# Patient Record
Sex: Male | Born: 1950
Health system: Southern US, Community
[De-identification: ages and names within clinical notes are randomized; demographics above are authoritative.]

## PROBLEM LIST (undated history)

## (undated) DIAGNOSIS — E785 Hyperlipidemia, unspecified: Secondary | ICD-10-CM

## (undated) DIAGNOSIS — I480 Paroxysmal atrial fibrillation: Secondary | ICD-10-CM

## (undated) DIAGNOSIS — E739 Lactose intolerance, unspecified: Secondary | ICD-10-CM

## (undated) DIAGNOSIS — M79671 Pain in right foot: Secondary | ICD-10-CM

## (undated) DIAGNOSIS — G4761 Periodic limb movement disorder: Secondary | ICD-10-CM

## (undated) HISTORY — DX: Hyperlipidemia, unspecified: E78.5

## (undated) HISTORY — DX: Paroxysmal atrial fibrillation: I48.0

## (undated) HISTORY — DX: Lactose intolerance, unspecified: E73.9

## (undated) HISTORY — DX: Periodic limb movement disorder: G47.61

## (undated) HISTORY — PX: TONSILLECTOMY: SUR1361

## (undated) HISTORY — DX: Pain in right foot: M79.671

---

## 2004-02-19 ENCOUNTER — Encounter: Payer: Self-pay | Admitting: Internal Medicine

## 2004-07-15 ENCOUNTER — Encounter: Payer: Self-pay | Admitting: Internal Medicine

## 2005-08-11 ENCOUNTER — Ambulatory Visit: Payer: Self-pay | Admitting: Internal Medicine

## 2006-02-01 ENCOUNTER — Ambulatory Visit: Payer: Self-pay | Admitting: Internal Medicine

## 2006-08-24 ENCOUNTER — Ambulatory Visit: Payer: Self-pay | Admitting: Internal Medicine

## 2006-08-24 LAB — CONVERTED CEMR LAB
ALT: 19 units/L (ref 0–40)
AST: 32 units/L (ref 0–37)
Creatinine, Ser: 1.4 mg/dL (ref 0.4–1.5)
Potassium: 4.6 meq/L (ref 3.5–5.1)
Sodium: 143 meq/L (ref 135–145)

## 2007-02-15 ENCOUNTER — Ambulatory Visit: Payer: Self-pay | Admitting: Internal Medicine

## 2007-06-14 ENCOUNTER — Ambulatory Visit: Payer: Self-pay | Admitting: Internal Medicine

## 2007-06-14 DIAGNOSIS — J309 Allergic rhinitis, unspecified: Secondary | ICD-10-CM | POA: Insufficient documentation

## 2007-06-14 DIAGNOSIS — R252 Cramp and spasm: Secondary | ICD-10-CM

## 2007-06-14 DIAGNOSIS — E785 Hyperlipidemia, unspecified: Secondary | ICD-10-CM | POA: Insufficient documentation

## 2007-06-14 DIAGNOSIS — M6281 Muscle weakness (generalized): Secondary | ICD-10-CM | POA: Insufficient documentation

## 2007-06-22 ENCOUNTER — Encounter: Payer: Self-pay | Admitting: Internal Medicine

## 2007-06-22 LAB — CONVERTED CEMR LAB
ALT: 11 units/L (ref 0–53)
AST: 23 units/L (ref 0–37)
Albumin: 4.2 g/dL (ref 3.5–5.2)
Alkaline Phosphatase: 58 units/L (ref 39–117)
Bilirubin, Direct: 0.1 mg/dL (ref 0.0–0.3)
Calcium: 9.3 mg/dL (ref 8.4–10.5)
Cholesterol: 229 mg/dL (ref 0–200)
Direct LDL: 168.6 mg/dL
HDL: 42.3 mg/dL (ref >39.0)
Magnesium: 2.1 mg/dL (ref 1.5–2.5)
PSA: 0.57 ng/mL (ref 0.10–4.00)
Potassium: 4.8 meq/L (ref 3.5–5.1)
Sed Rate: 3 mm/hr (ref 0–20)
TSH: 1.78 microintl units/mL (ref 0.35–5.50)
Total Bilirubin: 1.2 mg/dL (ref 0.3–1.2)
Total CHOL/HDL Ratio: 5.4
Total CK: 89 units/L (ref 7–195)
Total Protein: 6.7 g/dL (ref 6.0–8.3)
Triglycerides: 82 mg/dL (ref 0–149)
VLDL: 16 mg/dL (ref 0–40)
Vitamin B-12: 403 pg/mL (ref 211–911)

## 2007-06-24 ENCOUNTER — Encounter (INDEPENDENT_AMBULATORY_CARE_PROVIDER_SITE_OTHER): Payer: Self-pay | Admitting: *Deleted

## 2007-12-03 ENCOUNTER — Telehealth (INDEPENDENT_AMBULATORY_CARE_PROVIDER_SITE_OTHER): Payer: Self-pay | Admitting: *Deleted

## 2007-12-04 ENCOUNTER — Ambulatory Visit: Payer: Self-pay | Admitting: Internal Medicine

## 2007-12-06 ENCOUNTER — Ambulatory Visit: Payer: Self-pay

## 2007-12-11 ENCOUNTER — Encounter: Payer: Self-pay | Admitting: Internal Medicine

## 2007-12-11 ENCOUNTER — Ambulatory Visit: Payer: Self-pay

## 2007-12-13 ENCOUNTER — Encounter: Payer: Self-pay | Admitting: Internal Medicine

## 2007-12-20 ENCOUNTER — Ambulatory Visit: Payer: Self-pay | Admitting: Internal Medicine

## 2008-01-15 ENCOUNTER — Ambulatory Visit: Payer: Self-pay

## 2008-02-05 ENCOUNTER — Ambulatory Visit: Payer: Self-pay | Admitting: Internal Medicine

## 2008-02-17 ENCOUNTER — Encounter (INDEPENDENT_AMBULATORY_CARE_PROVIDER_SITE_OTHER): Payer: Self-pay | Admitting: *Deleted

## 2008-05-13 ENCOUNTER — Ambulatory Visit: Payer: Self-pay | Admitting: Internal Medicine

## 2008-05-13 DIAGNOSIS — M545 Low back pain: Secondary | ICD-10-CM | POA: Insufficient documentation

## 2008-09-18 ENCOUNTER — Ambulatory Visit: Payer: Self-pay | Admitting: Internal Medicine

## 2008-09-25 ENCOUNTER — Encounter: Payer: Self-pay | Admitting: Internal Medicine

## 2008-10-12 ENCOUNTER — Encounter: Payer: Self-pay | Admitting: Internal Medicine

## 2008-10-13 LAB — CONVERTED CEMR LAB
ALT: 12 units/L (ref 0–53)
AST: 24 units/L (ref 0–37)
Cholesterol: 166 mg/dL (ref 0–200)
HDL: 37.8 mg/dL — ABNORMAL LOW (ref >39.0)
LDL Cholesterol: 101 mg/dL — ABNORMAL HIGH (ref 0–99)
Total CHOL/HDL Ratio: 4.4
Triglycerides: 137 mg/dL (ref 0–149)
VLDL: 27 mg/dL (ref 0–40)

## 2008-10-14 ENCOUNTER — Encounter: Payer: Self-pay | Admitting: Internal Medicine

## 2008-10-15 ENCOUNTER — Ambulatory Visit (HOSPITAL_BASED_OUTPATIENT_CLINIC_OR_DEPARTMENT_OTHER): Admission: RE | Admit: 2008-10-15 | Discharge: 2008-10-15 | Payer: Self-pay | Admitting: Internal Medicine

## 2008-10-20 ENCOUNTER — Ambulatory Visit: Payer: Self-pay | Admitting: Pulmonary Disease

## 2008-10-27 ENCOUNTER — Telehealth (INDEPENDENT_AMBULATORY_CARE_PROVIDER_SITE_OTHER): Payer: Self-pay | Admitting: *Deleted

## 2008-10-27 ENCOUNTER — Encounter (INDEPENDENT_AMBULATORY_CARE_PROVIDER_SITE_OTHER): Payer: Self-pay | Admitting: *Deleted

## 2009-01-20 ENCOUNTER — Encounter: Payer: Self-pay | Admitting: Internal Medicine

## 2009-08-04 ENCOUNTER — Encounter: Payer: Self-pay | Admitting: Internal Medicine

## 2009-08-18 ENCOUNTER — Encounter: Payer: Self-pay | Admitting: Internal Medicine

## 2009-08-27 ENCOUNTER — Encounter (INDEPENDENT_AMBULATORY_CARE_PROVIDER_SITE_OTHER): Payer: Self-pay | Admitting: *Deleted

## 2009-10-08 ENCOUNTER — Ambulatory Visit: Payer: Self-pay | Admitting: Internal Medicine

## 2009-10-08 ENCOUNTER — Telehealth (INDEPENDENT_AMBULATORY_CARE_PROVIDER_SITE_OTHER): Payer: Self-pay | Admitting: *Deleted

## 2009-10-18 LAB — CONVERTED CEMR LAB
ALT: 16 units/L (ref 0–53)
AST: 34 units/L (ref 0–37)
Albumin: 4.3 g/dL (ref 3.5–5.2)
Alkaline Phosphatase: 50 units/L (ref 39–117)
Bilirubin, Direct: 0 mg/dL (ref 0.0–0.3)
Cholesterol: 155 mg/dL (ref 0–200)
HDL: 51.3 mg/dL (ref >39.00)
LDL Cholesterol: 94 mg/dL (ref 0–99)
Total Bilirubin: 1 mg/dL (ref 0.3–1.2)
Total CHOL/HDL Ratio: 3
Total Protein: 6.9 g/dL (ref 6.0–8.3)
Triglycerides: 49 mg/dL (ref 0.0–149.0)
VLDL: 9.8 mg/dL (ref 0.0–40.0)

## 2009-11-15 ENCOUNTER — Telehealth: Payer: Self-pay | Admitting: Internal Medicine

## 2010-03-04 ENCOUNTER — Ambulatory Visit: Payer: Self-pay | Admitting: Internal Medicine

## 2010-03-04 DIAGNOSIS — I4891 Unspecified atrial fibrillation: Secondary | ICD-10-CM

## 2010-03-04 DIAGNOSIS — G4730 Sleep apnea, unspecified: Secondary | ICD-10-CM | POA: Insufficient documentation

## 2010-03-22 ENCOUNTER — Telehealth (INDEPENDENT_AMBULATORY_CARE_PROVIDER_SITE_OTHER): Payer: Self-pay | Admitting: *Deleted

## 2010-03-23 ENCOUNTER — Ambulatory Visit: Payer: Self-pay

## 2010-03-23 ENCOUNTER — Ambulatory Visit: Payer: Self-pay | Admitting: Cardiology

## 2010-03-23 ENCOUNTER — Ambulatory Visit (HOSPITAL_COMMUNITY): Admission: RE | Admit: 2010-03-23 | Discharge: 2010-03-23 | Payer: Self-pay | Admitting: Internal Medicine

## 2010-03-23 ENCOUNTER — Encounter: Payer: Self-pay | Admitting: Internal Medicine

## 2010-03-23 ENCOUNTER — Encounter (HOSPITAL_COMMUNITY): Admission: RE | Admit: 2010-03-23 | Discharge: 2010-06-07 | Payer: Self-pay | Admitting: Internal Medicine

## 2010-04-07 ENCOUNTER — Ambulatory Visit: Payer: Self-pay | Admitting: Internal Medicine

## 2010-06-10 ENCOUNTER — Ambulatory Visit: Payer: Self-pay | Admitting: Internal Medicine

## 2010-06-10 DIAGNOSIS — G43809 Other migraine, not intractable, without status migrainosus: Secondary | ICD-10-CM | POA: Insufficient documentation

## 2010-06-10 DIAGNOSIS — S86819A Strain of other muscle(s) and tendon(s) at lower leg level, unspecified leg, initial encounter: Secondary | ICD-10-CM

## 2010-06-10 DIAGNOSIS — S838X9A Sprain of other specified parts of unspecified knee, initial encounter: Secondary | ICD-10-CM | POA: Insufficient documentation

## 2010-06-17 ENCOUNTER — Telehealth: Payer: Self-pay | Admitting: Internal Medicine

## 2010-06-28 ENCOUNTER — Telehealth: Payer: Self-pay | Admitting: Internal Medicine

## 2010-06-29 ENCOUNTER — Ambulatory Visit: Payer: Self-pay | Admitting: Internal Medicine

## 2010-06-30 LAB — CONVERTED CEMR LAB
Basophils Absolute: 0 10*3/uL (ref 0.0–0.1)
HCT: 43 % (ref 39.0–52.0)
Lymphs Abs: 1.1 10*3/uL (ref 0.7–4.0)
Monocytes Relative: 9.9 % (ref 3.0–12.0)
Neutrophils Relative %: 63.6 % (ref 43.0–77.0)
Platelets: 219 10*3/uL (ref 150.0–400.0)
RDW: 12.6 % (ref 11.5–14.6)

## 2010-07-15 ENCOUNTER — Telehealth: Payer: Self-pay | Admitting: Internal Medicine

## 2010-08-19 ENCOUNTER — Encounter: Payer: Self-pay | Admitting: Internal Medicine

## 2010-10-14 ENCOUNTER — Encounter: Payer: Self-pay | Admitting: Internal Medicine

## 2010-10-14 ENCOUNTER — Ambulatory Visit: Payer: Self-pay | Admitting: Internal Medicine

## 2010-11-08 ENCOUNTER — Encounter: Payer: Self-pay | Admitting: Internal Medicine

## 2010-11-11 ENCOUNTER — Other Ambulatory Visit: Payer: Self-pay | Admitting: Internal Medicine

## 2010-11-11 ENCOUNTER — Encounter (INDEPENDENT_AMBULATORY_CARE_PROVIDER_SITE_OTHER): Payer: Self-pay | Admitting: *Deleted

## 2010-11-11 ENCOUNTER — Ambulatory Visit
Admission: RE | Admit: 2010-11-11 | Discharge: 2010-11-11 | Payer: Self-pay | Source: Home / Self Care | Attending: Internal Medicine | Admitting: Internal Medicine

## 2010-11-11 LAB — CBC WITH DIFFERENTIAL/PLATELET
Basophils Absolute: 0 10*3/uL (ref 0.0–0.1)
Basophils Relative: 0.2 % (ref 0.0–3.0)
Eosinophils Absolute: 0 10*3/uL (ref 0.0–0.7)
Eosinophils Relative: 0.9 % (ref 0.0–5.0)
HCT: 43.7 % (ref 39.0–52.0)
Hemoglobin: 15.3 g/dL (ref 13.0–17.0)
Lymphocytes Relative: 27.7 % (ref 12.0–46.0)
Lymphs Abs: 1.3 10*3/uL (ref 0.7–4.0)
MCHC: 34.9 g/dL (ref 30.0–36.0)
MCV: 96.7 fl (ref 78.0–100.0)
Monocytes Absolute: 0.5 10*3/uL (ref 0.1–1.0)
Monocytes Relative: 9.9 % (ref 3.0–12.0)
Neutro Abs: 2.9 10*3/uL (ref 1.4–7.7)
Neutrophils Relative %: 61.3 % (ref 43.0–77.0)
Platelets: 219 10*3/uL (ref 150.0–400.0)
RBC: 4.52 Mil/uL (ref 4.22–5.81)
RDW: 13.1 % (ref 11.5–14.6)
WBC: 4.8 10*3/uL (ref 4.5–10.5)

## 2010-11-11 LAB — APTT: aPTT: 42.6 s — ABNORMAL HIGH (ref 21.7–28.8)

## 2010-11-11 LAB — BASIC METABOLIC PANEL
BUN: 18 mg/dL (ref 6–23)
CO2: 27 mEq/L (ref 19–32)
Calcium: 9 mg/dL (ref 8.4–10.5)
Chloride: 108 mEq/L (ref 96–112)
Creatinine, Ser: 1.3 mg/dL (ref 0.4–1.5)
GFR: 59.59 mL/min — ABNORMAL LOW (ref >60.00)
Glucose, Bld: 85 mg/dL (ref 70–99)
Potassium: 4.4 mEq/L (ref 3.5–5.1)
Sodium: 141 mEq/L (ref 135–145)

## 2010-11-11 LAB — PROTIME-INR
INR: 1.1 ratio — ABNORMAL HIGH (ref 0.8–1.0)
Prothrombin Time: 12.2 s (ref 10.2–12.4)

## 2010-11-16 ENCOUNTER — Ambulatory Visit (HOSPITAL_COMMUNITY)
Admission: RE | Admit: 2010-11-16 | Discharge: 2010-11-16 | Payer: Self-pay | Source: Home / Self Care | Attending: Cardiovascular Disease | Admitting: Cardiovascular Disease

## 2010-11-16 ENCOUNTER — Encounter: Payer: Self-pay | Admitting: Cardiovascular Disease

## 2010-11-17 ENCOUNTER — Ambulatory Visit (HOSPITAL_COMMUNITY)
Admission: RE | Admit: 2010-11-17 | Discharge: 2010-11-18 | Payer: Self-pay | Source: Home / Self Care | Attending: Internal Medicine | Admitting: Internal Medicine

## 2010-11-17 HISTORY — PX: OTHER SURGICAL HISTORY: SHX169

## 2010-11-21 LAB — MRSA PCR SCREENING: MRSA by PCR: NEGATIVE

## 2010-12-06 NOTE — Assessment & Plan Note (Signed)
Summary: f1y  Medications Added OSTEO BI-FLEX TRIPLE STRENGTH  TABS (MISC NATURAL PRODUCTS) take one tablet daily ZYRTEC ALLERGY 10 MG TABS (CETIRIZINE HCL) once daily FLONASE 50 MCG/ACT SUSP (FLUTICASONE PROPIONATE) use 1 spray in each nostril as needed ALEVE 220 MG TABS (NAPROXEN SODIUM) TAKE as needed for pain      Allergies Added: NKDA  CC:  f1y. Pt states he has had palpitations more recently and possible afib.  Pt has questions about A-fib in terms of long term heart changes..  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 He was last seen about a year and a half ago following a visit to Dr. Macon Large at [facility] for consideration of pulmonary vein isolation for recurrent atrial arrhythmias including PACs and fibrillation. He elected at that time not to pursue ablation mostly because of concern about the length of the procedure. He was treated with flecainide and his beta blocker was changed to verapamil and he did quite well for about 12 months.  Last few months he has had more problems with palpitations. Interestingly when he takes his pulse he does not find that it is racing.  He is not had exertional chest discomfort. He is able to go skiing with his sons without difficulty. He does worry however about coronary artery disease.  Reviewing his records we have an echo from 2009 demonstrating normal left ventricular function but a very large left atrium at 4.9 cm. His last Myoview on record is 2004; it was normal.  Current Medications (verified): 1)  Pravachol 40 Mg  Tabs (Pravastatin Sodium) .Marland Kitchen.. 1 At Bedtime- Labs Due Now 2)  Singulair 10 Mg Tabs (Montelukast Sodium) .... Take 1 Tab Once Daily 3)  Flecainide Acetate 150 Mg Tabs (Flecainide Acetate) .... Take One Tablet Two Times A Day 4)  Osteo Bi-Flex Triple Strength  Tabs (Misc Natural Products) .... Take One Tablet Daily 5)  Verapamil Hcl Cr 120 Mg Cr-Tabs (Verapamil Hcl) .... Take One Tablet  Once Daily 6)  Zyrtec Allergy 10 Mg Tabs (Cetirizine Hcl) .... Once Daily 7)  Flonase 50 Mcg/act Susp (Fluticasone Propionate) .... Use 1 Spray in Each Nostril As Needed 8)  Aleve 220 Mg Tabs (Naproxen Sodium) .... Take As Needed For Pain  Allergies (verified): No Known Drug Allergies  Vital Signs:  Patient profile:   60 year old male Height:      73 inches Weight:      224 pounds BMI:     29.66 Pulse rate:   60 / minute Pulse rhythm:   irregular BP sitting:   112 / 72  (left arm) Cuff size:   large  Vitals Entered By: Judithe Modest CMA (March 04, 2010 9:32 AM)  Physical Exam  General:  The patient was alert and oriented in no acute distress. HEENT Normal.  Neck veins were flat, carotids were brisk.  Lungs were clear.  Heart sounds were regular without murmurs or gallops.  Abdomen was soft with active bowel sounds. There is no clubbing cyanosis or edema. Skin Warm and dry    EKG  Procedure date:  03/04/2010  Findings:      sinus rhythm at 60 Intervals 0.21/0.11/0.43 Axis is 19 R. prime in lead V1 and V2 Otherwise normal  Impression & Recommendations:  Problem # 1:  ATRIAL FIBRILLATION (ICD-427.31) The patient has recurrent episodes of atrial fibrillation. Multiple issues were discussed today related to this lasting 40-45 minutes.  #1. The need for anticoagulation. The patient  has a CHADS VASC score of zero. I assured them that Coumadin was not necessary and that aspirin was optional.  #2 treatment options for his arrhythmia. These would include catheter ablation. We discussed long-term benefit data as well as short-term benefits and risks. He will continue to consider this.  #3 the palpitations that he has now are somewhat different from what he has before. I wonder as we reviewed his event recorder together whether these represent isolated PACs were very short runs that by the time he chooses to take his pulse is gone back to normal.  #4 potential interactions  between atrial fibrillation and obstructive sleep apnea. See below His updated medication list for this problem includes:    Flecainide Acetate 150 Mg Tabs (Flecainide acetate) .Marland Kitchen... Take one tablet two times a day  Problem # 2:  SLEEP APNEA (ICD-780.57) the patient has daytime somnolence a.m. fatigue and sleep disorder breathing. We'll arrange a 2 night night watch for study  Other Orders: EKG w/ Interpretation (93000) Nuclear Stress Test (Nuc Stress Test) Echocardiogram (Echo)  Patient Instructions: 1)  Your physician has requested that you have an echocardiogram.  Echocardiography is a painless test that uses sound waves to create images of your heart. It provides your doctor with information about the size and shape of your heart and how well your heart's chambers and valves are working.  This procedure takes approximately one hour. There are no restrictions for this procedure. 2)  Your physician has requested that you have an exercise stress myoview.  For further information please visit [email or web address]  Please follow instruction sheet, as given.

## 2010-12-06 NOTE — Progress Notes (Signed)
Summary: med not working  Psychologist, occupational from:  Patient on [facility]  Refills Requested: Medication #1:  PRAVACHOL 40 MG  TABS 1 at bedtime- LABS DUE NOW   Supply Requested: 3 months  Medication #2:  ALLEGRA 180 MG  TABS 1 by mouth once daily   Supply Requested: 3 months pt states that the allegra is not really helping to control his allergy. pt would like to know if there is something else that can be used to help control allergy  Initial call taken by: [facility] CMA,  November 15, 2009 2:05 PM  Follow-up for Phone Call        Singulair 10 mg once daily #30 Follow-up by: Marga Melnick MD,  November 15, 2009 2:26 PM  Additional Follow-up for Phone Call Additional follow up Details #1::        left pt detail message rx sent to pharmacy.............Marland KitchenFelecia Deloach CMA  November 15, 2009 3:38 PM     New/Updated Medications: SINGULAIR 10 MG TABS (MONTELUKAST SODIUM) take 1 tab once daily Prescriptions: SINGULAIR 10 MG TABS (MONTELUKAST SODIUM) take 1 tab once daily  #90 x 0   Entered by:   Jeremy Johann CMA   Authorized by:   Marga Melnick MD   Signed by:   Jeremy Johann CMA on 11/15/2009   Method used:   Faxed to ...       [facility] [address] # [phone]* (retail)       [address]       [place], [place]  [address]       Ph: [phone]       Fax: [phone]   RxID:   [id number] PRAVACHOL 40 MG  TABS (PRAVASTATIN SODIUM) 1 at bedtime- LABS DUE NOW  #90.0 Each x 2   Entered by:   Jeremy Johann CMA   Authorized by:   Marga Melnick MD   Signed by:   Jeremy Johann CMA on 11/15/2009   Method used:   Faxed to ...       [facility] [address] # [phone]* (retail)       [address]       [place], [place]  [address]       Ph: [phone]       Fax: [phone]   RxID:   [id number]

## 2010-12-06 NOTE — Assessment & Plan Note (Signed)
Summary: ec6/eval for ablation/per kelly/jml      Allergies Added: NKDA  Visit Type:  Initial Consult Referring Provider:  Dr Graciela Husbands Primary Provider:  Dr Alwyn Ren  CC:  afib.  History of Present Illness: Randy Cantu is a pleasant 60 yo WM with a h/o paroxysmal atrial fibrillation who presents today for further EP evaluation of afib.  He was initially diagnosed with SVT after presenting with rapid palpitations 5 years ago.  He was evaluated by Randy Graciela Husbands and determined to have afib.  He was initially treated with propranolol as needed.  He subsequently developed increasing frequency and duration of atrial fibrillation.  He was placed on flecainide and verapamil 2 years ago.  Flecainide did very well initially.  Unfortunately, he has had increasing frequency and duration of afib since that time.  He was seen by Randy Macon Large at [facility] 1 year ago, but decided to not have ablation.  He denies CP, SOB, dizziness, presyncope, or syncope.  During afib, he reports palpitations and fatigue.  He reports short episodes of afib/ palpitations typically after standing quickly or lying on his side.  Current Medications (verified): 1)  Pravachol 40 Mg  Tabs (Pravastatin Sodium) .Marland Kitchen.. 1 At Bedtime- Labs Due Now 2)  Singulair 10 Mg Tabs (Montelukast Sodium) .... Take 1 Tab Once Daily 3)  Flecainide Acetate 150 Mg Tabs (Flecainide Acetate) .... Take One Tablet Two Times A Day 4)  Osteo Bi-Flex Triple Strength  Tabs (Misc Natural Products) .... Take One Tablet Daily 5)  Verapamil Hcl Cr 120 Mg Cr-Tabs (Verapamil Hcl) .... Take One Tablet Once Daily 6)  Zyrtec Allergy 10 Mg Tabs (Cetirizine Hcl) .... Once Daily 7)  Flonase 50 Mcg/act Susp (Fluticasone Propionate) .... Use 1 Spray in Each Nostril As Needed 8)  Aleve 220 Mg Tabs (Naproxen Sodium) .... Take As Needed For Pain  Allergies (verified): No Known Drug Allergies  Past History:  Past Medical History: Reviewed history from 03/03/2010 and no changes  required. Allergic rhinitis Hyperlipidemia Paroxysmal atrial fibrillation  Past Surgical History: Reviewed history from 06/14/2007 and no changes required. Tonsillectomy  Family History: Reviewed history from 06/14/2007 and no changes required. 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  Denies tob,  glass or 2 of wine most days.  Denies drug use.  Review of Systems       All systems are reviewed and negative except as listed in the HPI.  Recent sleep study was normal per pt.  Vital Signs:  Patient profile:   60 year old male Height:      73 inches Weight:      223 pounds BMI:     29.53 Pulse rate:   52 / minute Resp:     16 per minute BP sitting:   132 / 78  (left arm)  Vitals Entered By: Marrion Coy, CNA (April 07, 2010 8:08 AM)  Physical Exam  General:  Well developed, well nourished, in no acute distress. Head:  normocephalic and atraumatic Eyes:  PERRLA/EOM intact; conjunctiva and lids normal. Nose:  no deformity, discharge, inflammation, or lesions Mouth:  Teeth, gums and palate normal. Oral mucosa normal. Neck:  Neck supple, no JVD. No masses, thyromegaly or abnormal cervical nodes. Lungs:  Clear bilaterally to auscultation and percussion. Heart:  Non-displaced PMI, chest non-tender; regular rate and rhythm, S1, S2 without murmurs, rubs or  gallops. Carotid upstroke normal, no bruit. Normal abdominal aortic size, no bruits. Femorals normal pulses, no bruits. Pedals normal pulses. No edema, no varicosities. Abdomen:  Bowel sounds positive; abdomen soft and non-tender without masses, organomegaly, or hernias noted. No hepatosplenomegaly. Msk:  Back normal, normal gait. Muscle strength and tone normal. Pulses:  pulses normal in all 4 extremities Extremities:  No clubbing or cyanosis. Neurologic:  Alert and oriented x 3. Skin:  Intact without lesions or  rashes. Cervical Nodes:  no significant adenopathy Psych:  Normal affect.   EKG  Procedure date:  04/07/2010  Findings:      sinus bradycardia, incomplete RBBB  Impression & Recommendations:  Problem # 1:  ATRIAL FIBRILLATION (ICD-427.31) The patient has symptomatic paroxysmal afib refractory to flecainide, verapamil, and beta blockers.  Therapeutic strategies for afib including medicine and ablation were discussed in detail with the patient today. Risk, benefits, and alternatives to EP study and radiofrequency ablation for afib were also discussed in detail today. These risks include but are not limited to stroke, bleeding, vascular damage, tamponade, perforation, damage to the esophagus, lungs, and other structures, pulmonary vein stenosis, worsening renal function, and death. The patient understands these risk and wishes to proceed.   He anticipates that he will have the procedure in November.  He will return to our office in September to discuss initiation of coumadin before the procedure. He has moderate LA enlargement and also moderate MR.  These will be followed by Randy Graciela Husbands with serial echos.

## 2010-12-06 NOTE — Progress Notes (Signed)
Summary: Triage: Concerns  Phone Note Call from Patient Call back at [phone]   Summary of Call: Message left on VM:   1.) Patient with increased migraines, patient is taking Topamax 50 mg 1 by mouth two times a day, and on his own added a 25mg  tab that he had to the eveing dose for a total of 50mg  am, 75mg  evening. Patient is wondering if this is ok  2.) Patient is going on a cruise next Wed and need a rx for him and his wife sent to pharmacy, this will be a 12 day cruise. Patient would also like Cipro just in case  3.) Patient would also like Gabapentin filled, he is almost finished with the #90 that was given on 06/28/2010   Dr.Hopper please advise on all 3 concerns./[facility]rae [facility] CMA  July 15, 2010 3:08 PM         New/Updated Medications: CIPROFLOXACIN HCL 500 MG TABS (CIPROFLOXACIN HCL) 1 by mouth two times a day TRANSDERM-SCOP 1.5 MG PT72 (SCOPOLAMINE BASE) 1 patch every 3 days Prescriptions: TRANSDERM-SCOP 1.5 MG PT72 (SCOPOLAMINE BASE) 1 patch every 3 days  #4 x 0   Entered and Authorized by:   Marga Melnick MD   Signed by:   Marga Melnick MD on 07/15/2010   Method used:   Faxed to ...       [facility] [address] # [phone]* (retail)       [address]       [place], [place]  [address]       Ph: [phone]       Fax: [phone]   RxID:   [id number] CIPROFLOXACIN HCL 500 MG TABS (CIPROFLOXACIN HCL) 1 by mouth two times a day  #20 x 0   Entered by:   Shonna Chock CMA   Authorized by:   Marga Melnick MD   Signed by:   Shonna Chock CMA on 07/15/2010   Method used:   Electronically to        [facility] [address] # [phone]* (retail)       [address]       [place], [place]  [address]       Ph: [phone]       Fax: [phone]   RxID:   [id number] GABAPENTIN 100 MG CAPS (GABAPENTIN) 1-3 every  8-12 hrs as needed  #90 x 2   Entered by:   Shonna Chock CMA   Authorized by:   Marga Melnick MD   Signed by:   Shonna Chock CMA on 07/15/2010   Method used:    Electronically to        [facility] # [phone]* (retail)       [address]       [place], [place]  [address]       Ph: [phone]       Fax: [phone]   RxID:   [id number]

## 2010-12-06 NOTE — Assessment & Plan Note (Signed)
Summary: Cardiology Nuclear Study  Nuclear Med Background Indications for Stress Test: Evaluation for Ischemia   History: Echo, GXT, Myocardial Perfusion Study  History Comments: '03 MPS: NL, EF=77% '09 Echo: NL, LV Fx '09 GXT: (Flecainide-no evidence of V. ectopy)  Symptoms: Palpitations    Nuclear Pre-Procedure Cardiac Risk Factors: Lipids Caffeine/Decaff Intake: None NPO After: 10:00 PM Lungs: clear IV 0.9% NS with Angio Cath: 18g     IV Site: (R) AC IV Started by: Stanton Kidney EMT-P Chest Size (in) 46     Height (in): 73 Weight (lb): 224 BMI: 29.66  Nuclear Med Study 1 or 2 day study:  1 day     Stress Test Type:  Stress Reading MD:  Olga Millers, MD     Referring MD:  S.Klein Resting Radionuclide:  Technetium 6m Tetrofosmin     Resting Radionuclide Dose:  11 mCi  Stress Radionuclide:  Technetium 33m Tetrofosmin     Stress Radionuclide Dose:  33 mCi   Stress Protocol Exercise Time (min):  11:00 min     Max HR:  157 bpm     Predicted Max HR:  162 bpm  Max Systolic BP: 189 mm Hg     Percent Max HR:  96.91 %     METS: 13.4 Rate Pressure Product:  [address]    Stress Test Technologist:  Milana Na EMT-P     Nuclear Technologist:  Domenic Polite CNMT  Rest Procedure  Myocardial perfusion imaging was performed at rest 45 minutes following the intravenous administration of Myoview Technetium 68m Tetrofosmin.  Stress Procedure  The patient exercised for 11:00. The patient stopped due to fatigue and denied any chest pain.  There were no significant ST-T wave changes.  Myoview was injected at peak exercise and myocardial perfusion imaging was performed after a brief delay.  QPS Raw Data Images:  Acuisition technically good; normal left ventricular size. Stress Images:  There is mild decreased uptake in the anterior wall. Rest Images:  There is mild decreased uptake in the anterior wall. Subtraction (SDS):  No evidence of ischemia. Transient Ischemic Dilatation:  .91   (Normal <1.22)  Lung/Heart Ratio:  .29  (Normal <0.45)  Quantitative Gated Spect Images QGS EDV:  118 ml QGS ESV:  40 ml QGS EF:  66 % QGS cine images:  Normal wall motion.   Overall Impression  Exercise Capacity: Excellent exercise capacity. BP Response: Normal blood pressure response. Clinical Symptoms: There is chest pain ECG Impression: No significant ST segment change suggestive of ischemia. Overall Impression: Low risk stress nuclear study with soft tissue attenuation but no ischemia.  Appended Document: Cardiology Nuclear Study pt aware of results

## 2010-12-06 NOTE — Assessment & Plan Note (Signed)
Summary: f/u cbs   Vital Signs:  Patient profile:   60 year old male Weight:      223.8 pounds Pulse rate:   60 / minute Resp:     14 per minute BP sitting:   104 / 68  (left arm) Cuff size:   large  Vitals Entered By: Shonna Chock CMA (June 29, 2010 9:37 AM) CC: Follow-up visit: Headaches    Primary Care Provider:  Dr Alwyn Ren  CC:  Follow-up visit: Headaches .  History of Present Illness: Migraines flared last week with single beer ; caffeine also had triggered headache recently. Gabapentin has been beneficial , but he is taking it 2 three times a day  for control. Headaches have improved as to severity & duration, "now a level 1-2 intermittently , previously 5+ & daily ". He is having greater periods of being headache free.  Current Medications (verified): 1)  Pravachol 40 Mg  Tabs (Pravastatin Sodium) .Marland Kitchen.. 1 At Bedtime- Labs Due Now 2)  Singulair 10 Mg Tabs (Montelukast Sodium) .... Take 1 Tab Once Daily 3)  Flecainide Acetate 150 Mg Tabs (Flecainide Acetate) .... Take One Tablet Two Times A Day 4)  Osteo Bi-Flex Triple Strength  Tabs (Misc Natural Products) .... Take One Tablet Daily 5)  Verapamil Hcl Cr 120 Mg Cr-Tabs (Verapamil Hcl) .... Take One Tablet Once Daily 6)  Topiragen 25 Mg Tabs (Topiramate) .Marland Kitchen.. 1 By Mouth Two Times A Day 7)  Gabapentin 100 Mg Caps (Gabapentin) .Marland Kitchen.. 1-3 Every  8-12 Hrs As Needed 8)  Allegra 180 Mg Tabs (Fexofenadine Hcl) .Marland Kitchen.. 1 By Mouth Once Daily  Allergies (verified): No Known Drug Allergies  Review of Systems General:  Denies chills, fever, and sweats. ENT:  Complains of nasal congestion and ringing in ears; denies ear discharge, earache, and sinus pressure; No facial pain  but frontal headache with  some yellow & bloody discharge in am intermittently.Marland Kitchen Resp:  Denies cough, shortness of breath, sputum productive, and wheezing. Allergy:  Complains of itching eyes and sneezing; denies hives or rash; Using Neti pot, Singulair & allegra or  Zyrtec. No angioedema.  Physical Exam  General:  well-nourished; alert,appropriate and cooperative throughout examination Eyes:  No corneal or conjunctival inflammation noted. Perrla.  Ears:  External ear exam shows no significant lesions or deformities.  Otoscopic examination reveals clear canals, tympanic membranes are intact bilaterally without bulging, retraction, inflammation or discharge. Hearing is grossly normal bilaterally. Nose:  External nasal examination shows no deformity or inflammation. Nasal mucosa are pink and moist without lesions or exudates. Mouth:  Oral mucosa and oropharynx without lesions or exudates.  Teeth in good repair. Lungs:  Normal respiratory effort, chest expands symmetrically. Lungs are clear to auscultation, no crackles or wheezes. Cervical Nodes:  No lymphadenopathy noted Axillary Nodes:  No palpable lymphadenopathy   Impression & Recommendations:  Problem # 1:  MIGRAINE VARIANT (ICD-346.20)  The following medications were removed from the medication list:    Aleve 220 Mg Tabs (Naproxen sodium) .Marland Kitchen... Take as needed for pain  Orders: T-Sinuses Complete (70220TC)  Problem # 2:  ALLERGIC RHINITIS (ICD-477.9)  intermittent purulence & bloody discharge The following medications were removed from the medication list:    Zyrtec Allergy 10 Mg Tabs (Cetirizine hcl) ..... Once daily as needed His updated medication list for this problem includes:    Allegra 180 Mg Tabs (Fexofenadine hcl) .Marland Kitchen... 1 by mouth once daily  Orders: T-Sinuses Complete (70220TC) Venipuncture ([address]) TLB-CBC Platelet - w/Differential (85025-CBCD)  Complete Medication List: 1)  Pravachol 40 Mg Tabs (Pravastatin sodium) .Marland Kitchen.. 1 at bedtime 2)  Singulair 10 Mg Tabs (Montelukast sodium) .... Take 1 tab once daily 3)  Flecainide Acetate 150 Mg Tabs (Flecainide acetate) .... Take one tablet two times a day 4)  Osteo Bi-flex Triple Strength Tabs (Misc natural products) .... Take one  tablet daily 5)  Verapamil Hcl Cr 120 Mg Cr-tabs (Verapamil hcl) .... Take one tablet once daily 6)  Topiragen 50 Mg Tabs (topiramate)  .Marland Kitchen.. 1 by mouth two times a day 7)  Gabapentin 100 Mg Caps (Gabapentin) .Marland Kitchen.. 1-3 every  8-12 hrs as needed 8)  Allegra 180 Mg Tabs (Fexofenadine hcl) .Marland Kitchen.. 1 by mouth once daily  Patient Instructions: 1)  Keep Headache Diary as discussed Prescriptions: SINGULAIR 10 MG TABS (MONTELUKAST SODIUM) take 1 tab once daily  #90 x 1   Entered and Authorized by:   Marga Melnick MD   Signed by:   Marga Melnick MD on 06/29/2010   Method used:   Print then Give to Patient   RxID:   [id number] TOPIRAGEN  50  MG TABS (TOPIRAMATE) 1 by mouth two times a day  #180 x 1   Entered and Authorized by:   Marga Melnick MD   Signed by:   Marga Melnick MD on 06/29/2010   Method used:   Print then Give to Patient   RxID:   [id number] PRAVACHOL 40 MG  TABS (PRAVASTATIN SODIUM) 1 at bedtime  #90 x 1   Entered and Authorized by:   Marga Melnick MD   Signed by:   Marga Melnick MD on 06/29/2010   Method used:   Print then Give to Patient   RxID:   [id number]   Appended Document: f/u cbs

## 2010-12-06 NOTE — Progress Notes (Signed)
Summary: refill  Phone Note Outgoing Call Call back at [phone]   Call placed by: [facility] CMA,  June 17, 2010 9:31 AM Summary of Call: pt left VM  that migraines are better but he still continue to get them in the afternoon. pt has been taken the gabapentin three times a day since the 1st day and just started the topamax bid on wed.  Pt will run out of gabapentin on sunday and needs a refill. pt uses [facility].............Felecia Deloach CMA  June 17, 2010 9:31 AM   Follow-up for Phone Call        Patient had refill on 06/10/2010 # 30, Dr.Hopper please advise if ok to fill again Follow-up by: Chrae Malloy CMA,  June 17, 2010 9:34 AM  Additional Follow-up for Phone Call Additional follow up Details #1::        Gabapentin 100 mg #60 ; can be increased to 2 every 8 hrs as needed  Additional Follow-up by: William Hopper MD,  June 17, 2010 10:09 AM    Additional Follow-up for Phone Call Additional follow up Details #2::    left pt detail message of med change and rx sent to pharmacy..................Felecia Deloach CMA  June 17, 2010 11:48 AM   New/Updated Medications: GABAPENTIN 100 MG CAPS (GABAPENTIN) increased to 2 every 8 hrs as needed Prescriptions: GABAPENTIN 100 MG CAPS (GABAPENTIN) increased to 2 every 8 hrs as needed  #60 x 0   Entered by:   Felecia Deloach CMA   Authorized by:   William Hopper MD   Signed by:   Felecia Deloach CMA on 06/17/2010   Method used:   Faxed to ...       [facility] # [id number]* (retail)       [address]       [place], [place]  [address]       Ph: [phone]       Fax: [phone]   RxID:   [id number]

## 2010-12-06 NOTE — Progress Notes (Signed)
Summary: Nuclear Pre-Procedure  Phone Note Outgoing Call Call back at [facility] Phone [phone]   Call placed by: Stanton Kidney, EMT-P,  Mar 22, 2010 12:58 PM Action Taken: Phone Call Completed Summary of Call: Left message with information on [employer] Information Sheet (see scanned document for details).     Nuclear Med Background Indications for Stress Test: Evaluation for Ischemia   History: Echo, GXT, Myocardial Perfusion Study  History Comments: '03 MPS: NL, EF=77% '09 Echo: NL, LV Fx '09 GXT: (Flecainide-no evidence of V. ectopy)  Symptoms: Palpitations    Nuclear Pre-Procedure Cardiac Risk Factors: Lipids Height (in): 73

## 2010-12-06 NOTE — Progress Notes (Signed)
Summary: Refill Request, Last Filled on 06/17/2010  Phone Note Refill Request Call back at [phone] Message from:  Patient  Refills Requested: Medication #1:  GABAPENTIN 100 MG CAPS increased to 2 every 8 hrs as needed.   Last Refilled: 06/17/2010 **Patient with pending appointment TOMORROW** Patient is out of med and needs his night and early morning dose, [facility] [place] Dr.Hopper please advise, last filled 06/17/10 #60  Initial call taken by: Shonna Chock CMA,  June 28, 2010 1:58 PM  Follow-up for Phone Call        #90;  1-3 every  8-12 hrs as needed  Follow-up by: Marga Melnick MD,  June 28, 2010 4:35 PM    New/Updated Medications: GABAPENTIN 100 MG CAPS (GABAPENTIN) 1-3 every  8-12 hrs as needed Prescriptions: GABAPENTIN 100 MG CAPS (GABAPENTIN) 1-3 every  8-12 hrs as needed  #90 x 0   Entered by:   Jeremy Johann CMA   Authorized by:   Marga Melnick MD   Signed by:   Jeremy Johann CMA on 06/28/2010   Method used:   Faxed to ...       [facility] Dr[address] # [id number]* (retail)       [address]       [place], [place]  [address]       Ph: [phone]       Fax: [phone]   RxID:   [id number]

## 2010-12-06 NOTE — Assessment & Plan Note (Signed)
Summary: headache/cbs   Vital Signs:  Patient profile:   60 year old male Weight:      224 pounds Temp:     98.5 degrees F oral Pulse rate:   72 / minute Resp:     12 per minute BP sitting:   120 / 74  (left arm) Cuff size:   large  Vitals Entered By: Shonna Chock CMA (June 10, 2010 12:20 PM) CC: Headaches, pulled muscle (left leg), and ? Randy.Kolleen Ochsner's opinion on AFib Surgery   Primary Care Provider:  Dr Alwyn Ren  CC:  Headaches, pulled muscle (left leg), and and ? Randy.Nevaen Tredway's opinion on AFib Surgery.  History of Present Illness:  Headaches      Randy Cantu  is a 60 year old man who presents with Headaches for 2-3 months, almost daily.  The patient reports nasal congestion  which may or may not accompany  headaches, but denies nausea, vomiting, sweats, tearing of eyes, sinus pain, sinus pressure, photophobia, and phonophobia.  The headache is described as intermittent and sharp.  The location of the pain is  mid frontally , same location as prior migraines. Last migraine headache was in 2004.Migraines would typically  taper over 48-72 hrs . Migraines resolved with prayer & Topamax. The only high-risk features (red flags) include age >50 years.  The patient denies the following high-risk features: fever, neck pain/stiffness, vision loss or change, focal weakness, altered mental status, rash, pain worse with exertion, or  new type of headache. This headache is not as bad as migraine. He has no prodrome or aura.He had visual prodrome with the migraines.Imitrex caused mental staus changes & dizziness. The headaches  have no triggers.  Prior treatment has included  NSAIDS (Aleve & ibuprofen) with only partial relief .    Current Medications (verified): 1)  Pravachol 40 Mg  Tabs (Pravastatin Sodium) .Marland Kitchen.. 1 At Bedtime- Labs Due Now 2)  Singulair 10 Mg Tabs (Montelukast Sodium) .... Take 1 Tab Once Daily 3)  Flecainide Acetate 150 Mg Tabs (Flecainide Acetate) .... Take One Tablet Two Times A Day 4)   Osteo Bi-Flex Triple Strength  Tabs (Misc Natural Products) .... Take One Tablet Daily 5)  Verapamil Hcl Cr 120 Mg Cr-Tabs (Verapamil Hcl) .... Take One Tablet Once Daily 6)  Zyrtec Allergy 10 Mg Tabs (Cetirizine Hcl) .... Once Daily As Needed 7)  Aleve 220 Mg Tabs (Naproxen Sodium) .... Take As Needed For Pain  Allergies (verified): No Known Drug Allergies  Review of Systems Eyes:  Denies blurring, double vision, and vision loss-both eyes. ENT:  Denies decreased hearing and earache; No facial pain or purulence. Slight tinnitus. MS:  Complains of muscle aches; He pulled  L thigh  hamstring wake surfing .  Physical Exam  General:  well-nourished,in no acute distress; alert,appropriate and cooperative throughout examination Eyes:  No corneal or conjunctival inflammation noted. EOMI. Perrla. Field of Vision grossly normal. Ears:  External ear exam shows no significant lesions or deformities.  Otoscopic examination reveals clear canals, tympanic membranes are intact bilaterally without bulging, retraction, inflammation or discharge. Hearing is grossly normal bilaterally. Nose:  External nasal examination shows no deformity or inflammation. Nasal mucosa are pink and moist without lesions or exudates. Mouth:  Oral mucosa and oropharynx without lesions or exudates.  Teeth in good repair. Heart:  Normal rate and regular rhythm. S1 and S2 normal without gallop, murmur, click, rub. Pulses:  R and L carotid  pulses are full and equal bilaterally w/o bruits Extremities:  No clubbing, cyanosis, edema, or deformity noted with normal full range of motion of all joints.  Linear ecchymosis L medial thigh . Negative SLR Neurologic:  alert & oriented X3, cranial nerves II-XII intact, strength normal in all extremities, sensation intact to light touch, gait normal, DTRs symmetrical and normal, finger-to-nose normal, heel-to-shin normal, and Romberg negative.   Skin:  See LLE Psych:  memory intact for recent  and remote, normally interactive, and good eye contact.     Impression & Recommendations:  Problem # 1:  MIGRAINE VARIANT (ICD-346.20) Chronic Daily Headache His updated medication list for this problem includes:    Aleve 220 Mg Tabs (Naproxen sodium) .Marland Kitchen... Take as needed for pain  Problem # 2:  MUSCLE STRAIN, HAMSTRING MUSCLE (ICD-844.8) Muscle fiber tearing  Complete Medication List: 1)  Pravachol 40 Mg Tabs (Pravastatin sodium) .Marland Kitchen.. 1 at bedtime- labs due now 2)  Singulair 10 Mg Tabs (Montelukast sodium) .... Take 1 tab once daily 3)  Flecainide Acetate 150 Mg Tabs (Flecainide acetate) .... Take one tablet two times a day 4)  Osteo Bi-flex Triple Strength Tabs (Misc natural products) .... Take one tablet daily 5)  Verapamil Hcl Cr 120 Mg Cr-tabs (Verapamil hcl) .... Take one tablet once daily 6)  Zyrtec Allergy 10 Mg Tabs (Cetirizine hcl) .... Once daily as needed 7)  Aleve 220 Mg Tabs (Naproxen sodium) .... Take as needed for pain 8)  Topiragen 25 Mg Tabs (Topiramate) .... As directed 9)  Gabapentin 100 Mg Caps (Gabapentin) .Marland Kitchen.. 1 every 8 hrs as needed  Patient Instructions: 1)  Stretching exercises with low weights as discussed.Zostrix cream OR moist heat.Topamax 25 mg at bedtime X 1 week then two times a day thereafter. Keep a Headache Diary as discussed. Prescriptions: GABAPENTIN 100 MG CAPS (GABAPENTIN) 1 every 8 hrs as needed  #30 x 0   Entered and Authorized by:   Marga Melnick MD   Signed by:   Marga Melnick MD on 06/10/2010   Method used:   Faxed to ...       [facility] Randy. # [phone]* (retail)       [address]       [place], [place]  [address]       Ph: [phone]       Fax: [phone]   RxID:   [id number] TOPIRAGEN 25 MG TABS (TOPIRAMATE) as directed  #60 x 0   Entered and Authorized by:   Marga Melnick MD   Signed by:   Marga Melnick MD on 06/10/2010   Method used:   Faxed to ...       [facility] Randy. # [phone]* (retail)       [address]       [place], [place]  [address]       Ph: [phone]       Fax: [phone]   RxID:   [id number]

## 2010-12-06 NOTE — Letter (Signed)
[facility]   Imported By: Lanelle Bal 08/27/2010 08:36:12  _____________________________________________________________________  External Attachment:    Type:   Image     Comment:   External Document

## 2010-12-08 NOTE — Letter (Signed)
Summary: ELectrophysiology/Ablation Procedure Instructions  [facility], Main Office  [address]   [place], [place] [address]   Phone: [phone]  Fax: [phone]     Electrophysiology/Ablation Procedure Instructions    You are scheduled for a(n) afib ablation  on 11/17/2010 at 7:30am with Dr. Johney Frame.  1.  Please come to the Short Stay Center at [facility] at 5:30am on the day of your procedure.  2.  Come prepared to stay overnight.   Please bring your insurance cards and a list of your medications.  3.  Come to the [place] office on 11/11/2010  for lab work.  The lab at [facility] is open from 8:30 AM to 1:30 PM and 2:30 PM to 5:00 PM.  The lab at [facility] is open from 7:30 AM to 5:30 PM.  You do not have to be fasting.  4.  Do not have anything to eat or drink after midnight the night before your procedure.  5.  Do NOT take these medications for 48 hours prior to  your procedure unless otherwise instructed: flecainide and Verapamil.  All of your remaining medications may be taken with a small amount of water.  6.  Educational material received Ablation   * Occasionally, EP studies and ablations can become lengthy.  Please make your family aware of this before your procedure starts.  Average time ranges from 2-8 hours for EP studies/ablations.  Your physician will locate your family after the procedure with the results.  * If you have any questions after you get home, please call the office at [phone]. Dennis Bast, RN  TEE--  11/16/2010  at 2:00pm  Check in at Short Stay at 1:00pm  Nothing to eat or Drink after midnight the night prior to procedure.  Dr Elease Hashimoto will do TEE

## 2010-12-08 NOTE — Assessment & Plan Note (Signed)
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  He was initially diagnosed with SVT after presenting with rapid palpitations 5 years ago.  He was evaluated by Dr Graciela Husbands and determined to have afib.  He was initially treated with propranolol as needed.  He subsequently developed increasing frequency and duration of atrial fibrillation.  He was placed on flecainide and verapamil 2 years ago.  Flecainide did very well initially.  Unfortunately, he has had increasing frequency and duration of afib since that time.  He was seen by Dr Macon Large at [facility] 1 year ago, but decided to not have ablation.  He denies CP, SOB, dizziness, presyncope, or syncope.  Presently, he reports haivng afib several times per week.  During afib, he reports palpitations and fatigue.  He reports short episodes of afib/ palpitations typically after standing quickly or lying on his side.  Current Medications (verified): 1)  Pravachol 40 Mg  Tabs (Pravastatin Sodium) .Marland Kitchen.. 1 At Bedtime 2)  Flecainide Acetate 150 Mg Tabs (Flecainide Acetate) .... Take One Tablet Two Times A Day 3)  Osteo Bi-Flex Triple Strength  Tabs (Misc Natural Products) .... Take One Tablet Daily 4)  Verapamil Hcl Cr 120 Mg Cr-Tabs (Verapamil Hcl) .... Take One Tablet Once Daily 5)  Zonegran 100 Mg Caps (Zonisamide) .... 3 Capsules At Bedtime 6)  Allegra 180 Mg Tabs (Fexofenadine Hcl) .... 1/2 By Mouth Once Daily  Allergies (verified): No Known Drug Allergies  Past History:  Past Medical History: Reviewed history from 03/03/2010 and no changes required. Allergic  rhinitis Hyperlipidemia Paroxysmal atrial fibrillation  Past Surgical History: Reviewed history from 06/14/2007 and no changes required. Tonsillectomy  Family History: Reviewed history from 04/07/2010 and no changes required. Father: AA  Mother: PVD, afib in her 6s MGF - prostate Ca PGF - leukemia    Social History: Reviewed history from 04/07/2010 and no changes required. 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  Denies tob,  glass or 2 of wine most days.  Denies drug use.  Review of Systems       All systems are reviewed and negative except as listed in the HPI.   Vital Signs:  Patient profile:   60 year old male Height:      73 inches Weight:      220 pounds BMI:     29.13 Pulse rate:   55 / minute BP sitting:   112 / 70  (left arm)  Vitals Entered By: Laurance Flatten CMA (October 14, 2010 3:12 PM)  Physical Exam  General:  Well developed, well nourished, in no acute distress. Head:  normocephalic and atraumatic Eyes:  PERRLA/EOM intact; conjunctiva and lids normal. Mouth:  Teeth, gums and palate normal. Oral mucosa normal. Neck:  Neck supple, no JVD. No masses, thyromegaly or abnormal cervical nodes. Lungs:  Clear bilaterally to auscultation and percussion. Heart:  Non-displaced PMI, chest non-tender; regular rate and rhythm, S1, S2 without murmurs, rubs or gallops. Carotid upstroke normal, no bruit. Normal abdominal aortic size, no bruits. Femorals normal pulses, no bruits. Pedals normal pulses. No edema,  no varicosities. Abdomen:  Bowel sounds positive; abdomen soft and non-tender without masses, organomegaly, or hernias noted. No hepatosplenomegaly. Msk:  Back normal, normal gait. Muscle strength and tone normal. Pulses:  pulses normal in all 4 extremities Extremities:  No clubbing or cyanosis. Neurologic:  Alert and oriented x 3.   Echocardiogram  Procedure date:  03/23/2010  Findings:       Study Conclusions    - Left ventricle: The  cavity size was normal. There was mild focal     basal hypertrophy of the septum. Systolic function was normal. The     estimated ejection fraction was in the range of 55% to 60%. Wall     motion was normal; there were no regional wall motion     abnormalities. Doppler parameters are consistent with abnormal     left ventricular relaxation (grade 1 diastolic dysfunction).   - Mitral valve: Moderate regurgitation.   - Left atrium: The atrium was moderately dilated.   - Right atrium: The atrium was mildly dilated.   Transthoracic echocardiography. M-mode, complete 2D, spectral   Doppler, and color Doppler. Height: Height: 185.4cm. Height: 73in.   Weight: Weight: 101.6kg. Weight: 223.5lb. Body mass index: BMI:   29.6kg/m^2. Body surface area: BSA: 2.42m^2. Blood pressure: 141/81.   Patient status: Outpatient. Location: [facility]  Left atrium                        Mitral valve   AP dim           50 mm     ------  Peak E vel    82.1 cm/s   -------   AP dim index   2.21 cm/m^2 <2.2    Peak A vel    74.7 cm/s   -------             Prepared and Electronically Authenticated by    Olga Millers, MD, [facility]   2011-05-18T12:[id number]     EKG  Procedure date:  10/16/2010  Findings:      sinus bradycardia 55 bpm, PR 210, QRS 110, Qtc 430, otherwise normal ekg  Impression & Recommendations:  Problem # 1:  ATRIAL FIBRILLATION (ICD-427.31) The patient has symptomatic paroxysmal afib refractory to flecainide, verapamil, and beta blockers.  Therapeutic strategies for afib including medicine and ablation were discussed in detail with the patient today. Risk, benefits, and alternatives to EP study and radiofrequency ablation for afib were also discussed in detail today. These risks include but are not limited to stroke, bleeding, vascular damage, tamponade, perforation, damage to the esophagus, lungs, and other structures, pulmonary vein stenosis, worsening renal function, and death. The  patient understands these risk and wishes to proceed.   We will therefore initiate pradaxa 150mg  two times a day and plan ablation at the next available time.

## 2010-12-08 NOTE — Assessment & Plan Note (Signed)
Summary: Pradaxa 150mg 

## 2010-12-08 NOTE — Letter (Signed)
Summary: ELectrophysiology/Ablation Procedure Instructions  [facility], Main Office  [address]   [place], [place] [address]   Phone: [phone]  Fax: [phone]     TEE Procedure Instructions Rev-1 November 11, 2010       You may have may a clear liquid breakfast up until 8:00 am the morning of November 16, 2009 before your TEE procedure. Clear liquids include water, broth, coffee, tea (no sugar), Sprite, Ginger Ale, cranberry/apple/grape juice and popsicle from clear juices. Nothing to eat or drink after that time.    * Occasionally, EP studies and ablations can become lengthy.  Please make your family aware of this before your procedure starts.  Average time ranges from 2-8 hours for EP studies/ablations.  Your physician will locate your family after the procedure with the results.  * If you have any questions after you get home, please call the office at [phone]. Dennis Bast, RN

## 2010-12-15 NOTE — Discharge Summary (Signed)
  NAME:  Randy Cantu, Randy Cantu NO.:  [id number]  MEDICAL RECORD NO.:  [id number]           PATIENT TYPE:  LOCATION:                                 FACILITY:  PHYSICIAN:  Doylene Canning. Ladona Ridgel, MD    DATE OF BIRTH:  [birth year]  DATE OF ADMISSION:  11/17/2010 DATE OF DISCHARGE:                              DISCHARGE SUMMARY   DATE OF ADMISSION:  November 17, 2010.  DATE OF DISCHARGE:  November 18, 2010.  PRIMARY CARDIOLOGIST:  Dr. Sherryl Manges and Dr. Hillis Range.  PRIMARY CARE PROVIDER:  [name].  DISCHARGE DIAGNOSES: 1. Paroxysmal atrial fibrillation, status post atrial fibrillation. 2. Hyperlipidemia. 3. History of allergic rhinitis. 4. Status post tonsillectomy.  ALLERGIES:  No known drug allergies.  PROCEDURES:  Electrophysiologic study with successful pulmonary vein isolation and radiofrequency catheter ablation.  HISTORY OF PRESENT ILLNESS:  A 60 year old male with prior history of paroxysmal atrial fibrillation despite ongoing verapamil and flecainide therapy.  He is anticoagulated with Pradaxa.  He was recently seen in clinic by Dr. Johney Frame, December 9, when patient reported recurrent AFib, and after discussion, decision was made to pursue AFib ablation.  HOSPITAL COURSE:  The patient presented to the [facility] on January 12, where he underwent electrophysiologic study with subsequent successful pulmonary vein isolation and radiofrequency catheter ablation.  The patient tolerated the procedure well, and post procedure, has maintained sinus rhythm with a first-degree AV block.  He has had no paroxysms of AFib.  We have initiated Protonix therapy for the next 4 weeks, and he will, otherwise, be discharged home on his previous medicines.  He will follow up with Dr. Johney Frame in approximately 12 weeks.  DISCHARGE LABS:  None.  DISPOSITION:  The patient will be discharged home today in good condition.  FOLLOWUP APPOINTMENTS:  The patient will follow  up with Dr. Johney Frame in approximately 12 weeks and follow up with [name] as previously scheduled.  DISCHARGE MEDICATIONS:  Protonix 40 mg daily, flecainide 150 mg b.i.d., Pradaxa 150 mg b.i.d., pravastatin 40 mg daily, verapamil SR 120 mg daily, Zonegran 100 mg 3 capsules at bedtime Zyrtec 10 mg daily.  OUTSTANDING LABORATORY STUDIES:  None.  Duration of discharge encounter, 40 minutes including physician time.     Nicolasa Ducking, ANP   ______________________________ Doylene Canning. Ladona Ridgel, MD    CB/MEDQ  D:  11/18/2010  T:  11/19/2010  Job:  [id number]  cc:   [name]  Electronically Signed by Nicolasa Ducking ANP on 12/05/2010 12:21:09 PM Electronically Signed by Lewayne Bunting MD on 12/15/2010 05:13:47 PM

## 2011-01-04 ENCOUNTER — Ambulatory Visit (INDEPENDENT_AMBULATORY_CARE_PROVIDER_SITE_OTHER): Payer: BC Managed Care – PPO | Admitting: Internal Medicine

## 2011-01-04 ENCOUNTER — Encounter: Payer: Self-pay | Admitting: Internal Medicine

## 2011-01-04 ENCOUNTER — Other Ambulatory Visit: Payer: Self-pay | Admitting: Internal Medicine

## 2011-01-04 DIAGNOSIS — K219 Gastro-esophageal reflux disease without esophagitis: Secondary | ICD-10-CM | POA: Insufficient documentation

## 2011-01-04 DIAGNOSIS — R1013 Epigastric pain: Secondary | ICD-10-CM

## 2011-01-04 LAB — CBC WITH DIFFERENTIAL/PLATELET
Eosinophils Relative: 0.5 % (ref 0.0–5.0)
HCT: 43.8 % (ref 39.0–52.0)
Lymphocytes Relative: 23.8 % (ref 12.0–46.0)
Monocytes Relative: 9.7 % (ref 3.0–12.0)
Neutrophils Relative %: 65.8 % (ref 43.0–77.0)
Platelets: 236 10*3/uL (ref 150.0–400.0)
WBC: 5.1 10*3/uL (ref 4.5–10.5)

## 2011-01-04 LAB — LIPASE: Lipase: 36 U/L (ref 11.0–59.0)

## 2011-01-04 LAB — H. PYLORI ANTIBODY, IGG: H Pylori IgG: NEGATIVE

## 2011-01-04 LAB — AMYLASE: Amylase: 39 U/L (ref 27–131)

## 2011-01-12 NOTE — Assessment & Plan Note (Signed)
Summary: dirrehea/stomach pain x'3 wk/cbs   Vital Signs:  Patient profile:   60 year old male Height:      73 inches (185.42 cm) Weight:      221.25 pounds (100.57 kg) BMI:     29.30 Temp:     98.5 degrees F (36.94 degrees C) oral Resp:     15 per minute BP sitting:   110 / 76  (left arm) Cuff size:   large  Vitals Entered By: Lucious Groves CMA (January 04, 2011 9:58 AM) CC: Diarrhea and abd pain x 3 weeks./kb, Abdominal pain Is Patient Diabetic? No Pain Assessment Patient in pain? yes     Location: abdomen Intensity: 5 Type: burning Onset of pain  3 weeks ago Comments Patient notes that he has been having abd burning and cramping. He denies fever, rectal bleeding, and vomiting.   Primary Care Provider:  Dr Alwyn Ren  CC:  Diarrhea and abd pain x 3 weeks./kb and Abdominal pain.  History of Present Illness:      Randy Cantu  presents with Abdominal pain for 3 weeks following Gastroenteritis outbreak among staff  @ his office.  The patient denies nausea, vomiting, diarrhea ( intermittent loose stool), constipation, melena, hematochezia, anorexia, and hematemesis.  The location of the pain is epigastric.  The pain is described as intermittent, burning - cramping in quality.  The patient denies the following symptoms: fever, weight loss, dysuria, chest pain, No PMH or FH of GI disease except ERD.? rash while on Protonix post ablation 11/17/2010. He questions GI symptoms related to Pradaxa .  Current Medications (verified): 1)  Pravachol 40 Mg  Tabs (Pravastatin Sodium) .Marland Kitchen.. 1 At Bedtime 2)  Flecainide Acetate 150 Mg Tabs (Flecainide Acetate) .... Take One Tablet Two Times A Day 3)  Osteo Bi-Flex Triple Strength  Tabs (Misc Natural Products) .... Take One Tablet Daily 4)  Verapamil Hcl Cr 120 Mg Cr-Tabs (Verapamil Hcl) .... Take One Tablet Once Daily 5)  Zonegran 100 Mg Caps (Zonisamide) .... 3 Capsules At Bedtime 6)  Allegra 180 Mg Tabs (Fexofenadine Hcl) .... 1/2 By Mouth Once  Daily 7)  Pradaxa 150 Mg Caps (Dabigatran Etexilate Mesylate) .... One By Mouth Two Times A Day 8)  Zegerid 40-1100 Mg Caps (Omeprazole-Sodium Bicarbonate) .Marland Kitchen.. 1 By Mouth Two Times A Day 9)  Zyrtec Allergy 10 Mg Tabs (Cetirizine Hcl) .Marland Kitchen.. 1 By Mouth Qhs  Allergies (verified): 1)  ! Protonix (Pantoprazole Sodium)  Past History:  Past Surgical History: Tonsillectomy Colonoscopy neg @ 51; Upper Endo : ERD ? 1997  Physical Exam  General:  well-nourished,in no acute distress; alert,appropriate and cooperative throughout examination Eyes:  No corneal or conjunctival inflammation noted. EOMI. No icterus Mouth:  Oral mucosa and oropharynx without lesions or exudates.  Teeth in good repair. No pharyngeal erythema.   Lungs:  Normal respiratory effort, chest expands symmetrically. Lungs are clear to auscultation, no crackles or wheezes. Heart:  regular rhythm, no murmur, no gallop, no rub, no JVD, no HJR, and bradycardia.   Abdomen:  Bowel sounds positive,abdomen soft and non-tender without masses, organomegaly or hernias noted. Skin:  Intact without suspicious lesions or rashes. nO JAUNDICE Cervical Nodes:  No lymphadenopathy noted Axillary Nodes:  No palpable lymphadenopathy Psych:  memory intact for recent and remote, normally interactive, and good eye contact.     Impression & Recommendations:  Problem # 1:  ABDOMINAL PAIN, EPIGASTRIC (ICD-789.06)  Orders: Venipuncture ([address]) TLB-CBC Platelet - w/Differential (85025-CBCD) TLB-Amylase (82150-AMYL) TLB-Lipase (83690-LIPASE) TLB-H. Pylori  Abs(Helicobacter Pylori) (86677-HELICO)  Problem # 2:  GERD (ICD-530.81)  Orders: Venipuncture ([address]) TLB-CBC Platelet - w/Differential (85025-CBCD) TLB-Amylase (82150-AMYL) TLB-Lipase (83690-LIPASE) TLB-H. Pylori Abs(Helicobacter Pylori) (86677-HELICO)  His updated medication list for this problem includes:    Ranitidine Hcl 150 Mg Tabs (Ranitidine hcl) .Marland Kitchen... 1 two times a day pre  meals  Complete Medication List: 1)  Pravachol 40 Mg Tabs (Pravastatin sodium) .Marland Kitchen.. 1 at bedtime 2)  Flecainide Acetate 150 Mg Tabs (Flecainide acetate) .... Take one tablet two times a day 3)  Osteo Bi-flex Triple Strength Tabs (Misc natural products) .... Take one tablet daily 4)  Verapamil Hcl Cr 120 Mg Cr-tabs (Verapamil hcl) .... Take one tablet once daily 5)  Zonegran 100 Mg Caps (Zonisamide) .... 3 capsules at bedtime 6)  Allegra 180 Mg Tabs (Fexofenadine hcl) .... 1/2 by mouth once daily 7)  Pradaxa 150 Mg Caps (Dabigatran etexilate mesylate) .... One by mouth two times a day 8)  Zyrtec Allergy 10 Mg Tabs (Cetirizine hcl) .Marland Kitchen.. 1 by mouth qhs 9)  Ranitidine Hcl 150 Mg Tabs (Ranitidine hcl) .Marland Kitchen.. 1 two times a day pre meals  Patient Instructions: 1)  Use Align once daily if stools are loose. Complete stool cards. 2)  Avoid foods high in acid (tomatoes, citrus juices, spicy foods). Avoid eating within two hours of lying down or before exercising. Do not over eat; try smaller more frequent meals. Elevate head of bed twelve inches when sleeping. Prescriptions: RANITIDINE HCL 150 MG TABS (RANITIDINE HCL) 1 two times a day pre meals  #60 x 0   Entered and Authorized by:   Marga Melnick MD   Signed by:   Marga Melnick MD on 01/04/2011   Method used:   Electronically to        [facility] # [phone]* (retail)       [address]       [place], [place]  [address]       Ph: [phone]       Fax: [phone]   RxID:   [id number]    Orders Added: 1)  Venipuncture [27253] 2)  TLB-CBC Platelet - w/Differential [85025-CBCD] 3)  TLB-Amylase [82150-AMYL] 4)  TLB-Lipase [83690-LIPASE] 5)  TLB-H. Pylori Abs(Helicobacter Pylori) [86677-HELICO] 6)  Est. Patient Level IV [66440]

## 2011-01-13 ENCOUNTER — Other Ambulatory Visit (INDEPENDENT_AMBULATORY_CARE_PROVIDER_SITE_OTHER): Payer: BC Managed Care – PPO

## 2011-01-13 ENCOUNTER — Encounter: Payer: Self-pay | Admitting: Internal Medicine

## 2011-01-13 DIAGNOSIS — Z1211 Encounter for screening for malignant neoplasm of colon: Secondary | ICD-10-CM

## 2011-01-13 LAB — CONVERTED CEMR LAB
OCCULT 1: NEGATIVE
OCCULT 2: NEGATIVE

## 2011-01-16 ENCOUNTER — Encounter (INDEPENDENT_AMBULATORY_CARE_PROVIDER_SITE_OTHER): Payer: Self-pay | Admitting: *Deleted

## 2011-01-24 NOTE — Letter (Signed)
Summary: Results Follow up Letter  [place] [facility]  [address] [place], [place] [address]   Phone: [phone]  Fax: [phone]    01/16/2011 MRN: [id number]  Randy Cantu [address] [place], [place]  [address]  [place]  Dear [name],  The following are the results of your recent test(s):  Test         Result    Pap Smear:        Normal _____  Not Normal _____ Comments: ______________________________________________________ Cholesterol: LDL(Bad cholesterol):         Your goal is less than:         HDL (Good cholesterol):       Your goal is more than: Comments:  ______________________________________________________ Mammogram:        Normal _____  Not Normal _____ Comments:  ___________________________________________________________________ Hemoccult:        Normal __X___  Not normal _______ Comments:    _____________________________________________________________________ Other Tests:    We routinely do not discuss normal results over the telephone.  If you desire a copy of the results, or you have any questions about this information we can discuss them at your next office visit.   Sincerely,

## 2011-02-21 ENCOUNTER — Encounter: Payer: Self-pay | Admitting: Internal Medicine

## 2011-02-22 ENCOUNTER — Ambulatory Visit (INDEPENDENT_AMBULATORY_CARE_PROVIDER_SITE_OTHER): Payer: BC Managed Care – PPO | Admitting: Internal Medicine

## 2011-02-22 ENCOUNTER — Encounter: Payer: Self-pay | Admitting: Internal Medicine

## 2011-02-22 DIAGNOSIS — R1013 Epigastric pain: Secondary | ICD-10-CM

## 2011-02-22 DIAGNOSIS — I4891 Unspecified atrial fibrillation: Secondary | ICD-10-CM

## 2011-02-22 NOTE — Assessment & Plan Note (Signed)
I find the patients recent GI symptoms worrisome.  Given his recent afib ablation, symptoms may have been due to esophageal injury from RF ablation (commonly symptoms occur 4-6 weeks post ablation). Fortunately, he feels clear that his symptoms have been absent for at least 2 weeks.  I have strongly recommended that he proceed with EGD to evaluate his esophagus at this time, however, he declines.  I have also recommended chest CT with contrast to evaluate for esophageal necrosis, atrial/esophageal fistula and he is clear that he wishes to decline.  He is aware of my concerns. As his symptoms have completely resolved for 2 weeks, I am hopeful that he will do well.  I have made it very clear that he should contact me immediately should he develop fevers, chills, further odynophagia, GI symptoms, or any other concerns to arrange immediate workup. Pradaxa is another potential cause of his GI symptoms.  We will therefore stop pradaxa at this time (as above).

## 2011-02-22 NOTE — Assessment & Plan Note (Signed)
The patient reports no further afib s/p ablation.  He has been able to exercise without any afib recurrence  (previously had frequent exercise induced afib).  He appears to be doing quite well, though I am concerned about his recent GI symptoms (see below).  He has rare fleeting palpitations which I suspect are PACS/ PVCs.  We will stop flecainide today.  If he has increasing palpitations, then we will place an event monitor. His CHADSVasc score is 0.  We will therefore stop pradaxa today.  I have recommended ASA 81mg  daily as tolerated.

## 2011-02-22 NOTE — Progress Notes (Signed)
The patient presents today for routine electrophysiology followup.  Since his afib ablation, he reports doing well.  He has had no further sustained afib.  He reports occasional palpitations lasting several seconds.  Today, he denies symptoms of  chest pain, shortness of breath, orthopnea, PND, lower extremity edema, dizziness, presyncope, syncope, or neurologic sequela.   He reports having significant GI distress since his ablation.  He describes primarily abdominal distension with discomfort and diarrhea.  He has tried multiple medicines for reflux and has subsequently concluded that he is lactose intolerant.  He reports that 3 weeks ago, he did have odynophagia and thinks that he briefly spit up blood on one occasion.  He has had no odynophagia, bleeding, or any other GI symptoms for the past 2 weeks and feels that his symptoms are resolving.  The patient feels that he is tolerating medications without difficulties and is otherwise without complaint today.   Past Medical History  Diagnosis Date  . Allergic rhinitis   . HLD (hyperlipidemia)   . PAF (paroxysmal atrial fibrillation)     s/p afib ablation 11/17/10   Past Surgical History  Procedure Date  . Tonsillectomy   . Atrial fibrillation ablation 11/17/10    Current Outpatient Prescriptions  Medication Sig Dispense Refill  . cetirizine (ZYRTEC) 10 MG tablet Take 10 mg by mouth daily.        . fexofenadine (ALLEGRA) 180 MG tablet Take 90 mg by mouth daily.        . Glucosamine-Chondroitin (OSTEO BI-FLEX REGULAR STRENGTH PO) Take by mouth.        . pravastatin (PRAVACHOL) 40 MG tablet Take 40 mg by mouth daily.        . ranitidine (ZANTAC) 150 MG capsule Take 150 mg by mouth 2 (two) times daily.        . verapamil (CALAN) 120 MG tablet Take 120 mg by mouth daily.        Marland Kitchen zonisamide (ZONEGRAN) 100 MG capsule Take 300 mg by mouth at bedtime.        Marland Kitchen DISCONTD: dabigatran (PRADAXA) 150 MG CAPS Take 150 mg by mouth.       . DISCONTD:  flecainide (TAMBOCOR) 150 MG tablet Take by mouth. 75 mg in the morning and 150 mg in the evening        Allergies  Allergen Reactions  . Pantoprazole Sodium     REACTION: rash from this or from  topical disinfectant    History   Social History  . Marital Status: Married    Spouse Name: N/A    Number of Children: N/A  . Years of Education: N/A   Occupational History  . Not on file.   Social History Main Topics  . Smoking status: Never Smoker   . Smokeless tobacco: Not on file  . Alcohol Use: 1.2 oz/week    2 Glasses of wine per week     occasional  . Drug Use: No  . Sexually Active: Not on file   Other Topics Concern  . Not on file   Social History Narrative   Lives in [place] and works as an Sport and exercise psychologist    Family History  Problem Relation Age of Onset  . Cancer     Physical Exam: Filed Vitals:   02/22/11 1126  BP: 110/60  Pulse: 55  Height: 6\' 1"  (1.854 m)  Weight: 215 lb (97.523 kg)    GEN- The patient is well appearing, alert and oriented x 3 today.  Head- normocephalic, atraumatic Eyes-  Sclera clear, conjunctiva pink Ears- hearing intact Oropharynx- clear Neck- supple, no JVP Lymph- no cervical lymphadenopathy Lungs- Clear to ausculation bilaterally, normal work of breathing Heart- Regular rate and rhythm, no murmurs, rubs or gallops, PMI not laterally displaced GI- soft, NT, ND, + BS Extremities- no clubbing, cyanosis, or edema MS- no significant deformity or atrophy Skin- no rash or lesion Psych- euthymic mood, full affect Neuro- strength and sensation are intact   EKG- sinus bradycardia 55 bpm, PR 208, QRS 114, Qtc 420, otherwise normal ekg

## 2011-02-22 NOTE — Patient Instructions (Signed)
Your physician recommends that you schedule a follow-up appointment in: 3 months with Dr. Johney Frame Your physician has recommended you make the following change in your medication: Stop Flecanide and Pradaxa

## 2011-02-23 NOTE — Progress Notes (Signed)
Addended by: Deliah Goody on: 02/23/2011 02:12 PM   Modules accepted: Orders

## 2011-02-24 ENCOUNTER — Other Ambulatory Visit: Payer: Self-pay | Admitting: Internal Medicine

## 2011-02-24 NOTE — Telephone Encounter (Signed)
Lipid/Hep 272.4/995.20  

## 2011-03-08 ENCOUNTER — Other Ambulatory Visit: Payer: Self-pay | Admitting: Internal Medicine

## 2011-03-21 NOTE — Procedures (Signed)
[facility]                              EXERCISE TREADMILL   NAME:Barber, TAEVON ASCHOFF                     MRN:          [id number]  DATE:12/20/2007                            DOB:          [birth year]    PROCEDURE:  Treadmill.   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 He now says he has had a little bit of dysgeusia.  He was taken with modification of standard Bruce protocol up to stage 5.  His peak heart rate was 148 beats per minute. There was no evidence of  ventricular ectopy.   The patient tolerated the procedure well.     Duke Salvia, MD, [facility]  Electronically Signed    SCK/MedQ  DD: 12/20/2007  DT: 12/22/2007  Job #: [id number]

## 2011-03-21 NOTE — Procedures (Signed)
[facility]                              EXERCISE TREADMILL   NAME:[name], [name]                     MRN:          [id number]  DATE:01/15/2008                            DOB:          16-Jan-1951    [name] was admitted for treadmill testing on January 15, 2008 to look  for proarrhythmia in the setting of 1C therapy.  He is taking  flecainide, we think 50 mg twice a day, although he changes his doses.   He was submitted to a variation of standard Bruce protocol and went to  exhaustion which was halfway through stage 4.  There is no evidence of  ventricular proarrhythmia.   His maximum heart rate was approximately 153.  There were occasional  bigeminal beats.   He otherwise did well.     Duke Salvia, MD, [facility]  Electronically Signed    SCK/MedQ  DD: 01/16/2008  DT: 01/16/2008  Job #: [id number]

## 2011-03-21 NOTE — Procedures (Signed)
NAME:  Randy Cantu, Randy Cantu NO.:  [id number]   MEDICAL RECORD NO.:  [id number]          PATIENT TYPE:  OUT   LOCATION:  SLEEP CENTER                 FACILITY:  [facility]   PHYSICIAN:  Barbaraann Share, MD,FCCPDATE OF BIRTH:  06-02-1952   DATE OF STUDY:  10/15/2008                            NOCTURNAL POLYSOMNOGRAM   REFERRING PHYSICIAN:  Duke Salvia, MD, [facility]   REFERRING PHYSICIAN:  Duke Salvia, MD, [facility]   INDICATION FOR STUDY:  Hypersomnia with possible sleep apnea.   EPWORTH SLEEPINESS SCORE:  10.   MEDICATIONS:   SLEEP ARCHITECTURE:  The patient had a total sleep time of 307 minutes  with no slow wave sleep and only 36 minutes of REM.  Sleep onset latency  was prolonged at 55 minutes, and the REM onset was prolonged at 212  minutes.  Sleep efficiency was poor at 70%.   RESPIRATORY DATA:  The patient was found to have 1 obstructive apnea and  10 obstructive hypopneas for an AHI of 2 events per hour.  The events  occurred primarily during REM and there was mild-to-moderate snoring  noted.  The patient did not meet split-night criteria secondary to the  small numbers of events.   OXYGEN DATA:  There was O2 desaturation transiently to 89%.   CARDIAC DATA:  No clinically significant arrhythmias were noted.   MOVEMENT-PARASOMNIA:  The patient did not have any significant leg jerks  or abnormal behaviors.   IMPRESSIONS-RECOMMENDATIONS:  Small numbers of obstructive events which  do not meet the apnea-hypopnea index criteria for the obstructive sleep  apnea syndrome.  If the patient is having significant inappropriate  daytime sleepiness that cannot be explained by his medications or other  history, further workup may be required.     Barbaraann Share, MD,FCCP  Diplomate, American Board of Sleep  Medicine  Electronically Signed    [facility]/MEDQ  D:  10/20/2008 16:45:49  T:  10/21/2008 10:12:10  Job:  [id number]

## 2011-03-21 NOTE — Assessment & Plan Note (Signed)
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  He is now having more palpitations.  He is  describing 2 different types of rhythms, seeming to alternate between  the two.  One is regular.  The other is irregular.  His heart is  described as jumping and bouncing all around in his chest.  It can last  for minutes to hours.  It is often post prandial, often following  alcohol.   Previously his identified rhythms included PACs, atrial bigeminy and  atrial tachycardia.  I do not have those original tracings that had come  from Dr. Tresa Endo.   On examination, blood pressure is 122/77, his pulse was 52.  His lungs  were clear.  Heart sounds were regular, and extremities were without  edema.   Electrocardiogram today demonstrated sinus rhythm at 51, with intervals  of 0.16/0.09/0.042.   IMPRESSION:  1. Recurrent tachy palpitations  2. Relative bradycardia precluding much AV nodal blocking medication.   We will plan to undertake a Life Watch recorder to see if we can  identify the rhythm.  I suspect it is atrial fibrillation.  Given his  bradycardia, AV nodal blocking agents will not be a great option unless  we try an ISA drug.  The alternative would be an antiarrhythmic drug.  In the event that we find atrial fibrillation, I think repeat  echocardiography would be important to look at his left atrial  dimensions.   We will see him again in 3-4 weeks time to review the above.     Duke Salvia, MD, [facility]  Electronically Signed    SCK/MedQ  DD: 12/04/2007  DT: 12/04/2007  Job #: [id number]

## 2011-03-21 NOTE — Letter (Signed)
September 18, 2008    Dr. Val Riles  [facility] [address]  [place], [place]  [address]   RE:  Randy Cantu, Randy Cantu  MRN:  [id number]  /  DOB:  06-04-1951   Dear Tris,   We have to stop meeting like this.  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  He has a  longstanding history of palpitations which correlated with atrial  fibrillation dating back 5-7 years.  We have tried beta-blockers for  rate control, propafenone and now flecainide for rhythm control the  latter of which has worked pretty well but was associated with a fall  that occurs after he takes medication as well as lateral nystagmus (it  will take an optometrist to figure that out).   His left ventricular function has remained normal as recently as  February 2009.  His thromboembolic risk factors are broadly negative.   He does have moderate snoring and some daytime fatigue.  He has been  disinclined to proceed with sleep studies in the past but I told him of  your concern about this issue and he is scheduled for a sleep study in  anticipation of hopefully talking with you about the possibility of  undergoing an ablation procedure for his atrial fibrillation.   His current medications include,  1. Flecainide 150 b.i.d.  2. Propranolol 60 daily.  3. Pravastatin.   His blood pressure during examination was 110/64.  His pulse was 52 and  he has had heart rates as low as in the low 40s and high 30s, his weight  was 229.  His lungs were clear.  His heart sounds were regular today  with an S4.  The abdomen was soft and the extremities had no edema.   IMPRESSION:  1. Paroxysmal atrial fibrillation.  2. Flecainide for paroxysmal atrial fibrillation.  3. Sleep disordered breathing possible obstructive sleep apnea  with      sleep study pending.  4. Lateral nystagmus and fall associated with flecainide.   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   His cell number to contact him is [phone].   Thanks very much for helping [place] always.    Sincerely,      Duke Salvia, MD, [facility]  Electronically Signed    SCK/MedQ  DD: 09/18/2008  DT: 09/19/2008  Job #: [id number]

## 2011-03-24 NOTE — Assessment & Plan Note (Signed)
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   I had seen him about a year ago.  Event recording at that time had  demonstrated both PACs, some bigeminals and then recurrent episodes of  an abrupt onset narrow QRS tachycardia, occasionally precipitated by  broad complex beats of occasionally primarily or recurrently by narrow  complex beats.  The first P-wave in the train appeared to be a long RP  configuration and then thereafter inscribed as short RP event.  He notes  that these episodes occur primarily when he is riding his bike, which he  does at Spin class.  He will then come home, have a couple of beers, get  up and then have it happen in that situation, typically lasting 20-30  minutes.  When it occurs outside the context of exercise itself, it is  associated with light-headedness, otherwise not.   We had used propranolol p.r.n. at 10 mg prior to exercise, to see how  that would help.  That has helped only mildly.   His other medications include Vytorin, Allegra, red yeast, fish oil,  Ginkgo and I think also Viagra, as there is a question about that on a  piece of paper that he gave me.   PHYSICAL EXAMINATION:  His blood pressure is 132/80, pulse was 54.  Lungs were clear.  Heart sounds were regular and extremities were  without edema.   IMPRESSION:  1. Recurrent tachy palpitations, question mechanism.  2. PACs.  3. Sinus bradycardia.  4. Dyslipidemia.   Options include:   1. Daily medicinal therapy.  2. EP study as regards catheter ablation.  3. Antiarrhythmic drug therapy.   We discussed the possibility of whether this was a pulmonary vein focus  and what implications  that would have to a procedure.  It is whether one  would undertake a single procedure at which we would refer him to a  pulmonary vein isolation center or undertake a preliminary procedure,  looking for mapping and possibly a focus that was ablatable from the  right side or the mitral annulus.   He is favoring that as opposed to a pulmonary vein procedure, but he, in  the interim, would like to try medicinal therapy, so I have given him  prescriptions for either Inderal-LA 80 or Cardizem  120 and we will see how he does with that and then he would take Inderal  20 prior to exertion to see if that combination did not help minimize  his symptoms.   We will see him again in three months' time.     Duke Salvia, MD, [facility]  Electronically Signed    SCK/MedQ  DD: 02/15/2007  DT: 02/15/2007  Job #: [id number]

## 2011-05-18 ENCOUNTER — Encounter: Payer: Self-pay | Admitting: Internal Medicine

## 2011-05-23 ENCOUNTER — Other Ambulatory Visit: Payer: Self-pay | Admitting: Internal Medicine

## 2011-05-23 NOTE — Telephone Encounter (Signed)
Lipid/Hep 272.4/995.20  

## 2011-05-24 ENCOUNTER — Ambulatory Visit (INDEPENDENT_AMBULATORY_CARE_PROVIDER_SITE_OTHER): Payer: BC Managed Care – PPO | Admitting: Internal Medicine

## 2011-05-24 ENCOUNTER — Encounter: Payer: Self-pay | Admitting: Internal Medicine

## 2011-05-24 DIAGNOSIS — K219 Gastro-esophageal reflux disease without esophagitis: Secondary | ICD-10-CM

## 2011-05-24 DIAGNOSIS — I4891 Unspecified atrial fibrillation: Secondary | ICD-10-CM

## 2011-05-24 NOTE — Assessment & Plan Note (Signed)
Doing well s/p ablation HE has occasional palpitations.  I am not certain that this is afib. We will stop flecainide and place an event monitor We will defer further treatment until results are available.

## 2011-05-24 NOTE — Progress Notes (Signed)
The patient presents today for routine electrophysiology followup.  Since his last visit to our clinic, he reports doing well.  He reports occasional palpitations lasting several seconds.  He has restarted flecainide because of this.  Today, he denies symptoms of  chest pain, shortness of breath, orthopnea, PND, lower extremity edema, dizziness, presyncope, syncope, or neurologic sequela.   He continues to have occasional indigestion for which he takes Zantac.  The patient feels that he is tolerating medications without difficulties and is otherwise without complaint today.   Past Medical History  Diagnosis Date  . Allergic rhinitis   . HLD (hyperlipidemia)   . PAF (paroxysmal atrial fibrillation)     s/p afib ablation 11/17/10   Past Surgical History  Procedure Date  . Tonsillectomy   . Atrial fibrillation ablation 11/17/10    Current Outpatient Prescriptions  Medication Sig Dispense Refill  . diclofenac (CATAFLAM) 50 MG tablet as needed.      . fexofenadine (ALLEGRA) 180 MG tablet Take 90 mg by mouth daily.        . flecainide (TAMBOCOR) 150 MG tablet TAKE ONE TABLET BY MOUTH TWICE DAILY  180 tablet  0  . pravastatin (PRAVACHOL) 40 MG tablet TAKE 1 TABLET BY MOUTH AT BEDTIME  30 tablet  0  . ranitidine (ZANTAC) 150 MG capsule Take 150 mg by mouth 2 (two) times daily.        . verapamil (CALAN) 120 MG tablet Take 120 mg by mouth daily.        Marland Kitchen zonisamide (ZONEGRAN) 100 MG capsule Take 300 mg by mouth at bedtime.          Allergies  Allergen Reactions  . Pantoprazole Sodium     REACTION: rash from this or from  topical disinfectant    History   Social History  . Marital Status: Married    Spouse Name: N/A    Number of Children: N/A  . Years of Education: N/A   Occupational History  . Not on file.   Social History Main Topics  . Smoking status: Never Smoker   . Smokeless tobacco: Not on file  . Alcohol Use: 1.2 oz/week    2 Glasses of wine per week     occasional  .  Drug Use: No  . Sexually Active: Not on file   Other Topics Concern  . 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 Cancer    . Peripheral vascular disease Mother     afib in her 34s  . Aplastic anemia Father   . Prostate cancer Maternal Grandfather   . Leukemia Paternal Grandfather    Physical Exam: Filed Vitals:   05/24/11 0932  BP: 115/75  Pulse: 53  Height: 6\' 1"  (1.854 m)  Weight: 219 lb (99.338 kg)    GEN- The patient is well appearing, alert and oriented x 3 today.   Head- normocephalic, atraumatic Eyes-  Sclera clear, conjunctiva pink Ears- hearing intact Oropharynx- clear Neck- supple, no JVP Lymph- no cervical lymphadenopathy Lungs- Clear to ausculation bilaterally, normal work of breathing Heart- Regular rate and rhythm, no murmurs, rubs or gallops, PMI not laterally displaced GI- soft, NT, ND, + BS Extremities- no clubbing, cyanosis, or edema MS- no significant deformity or atrophy Skin- no rash or lesion Psych- euthymic mood, full affect Neuro- strength and sensation are intact   EKG-  sinus bradycardia 53 bpm, PR 228, QRS 118, Qtc 431, incomplete RBBB, otherwise normal ekg

## 2011-05-24 NOTE — Assessment & Plan Note (Signed)
Continue Zantac We will refer him to GI

## 2011-05-24 NOTE — Patient Instructions (Signed)
Your physician wants you to follow-up in: 5 months with Dr Johney Frame Bonita Quin will receive a reminder letter in the mail two months in advance. If you don't receive a letter, please call our office to schedule the follow-up appointment.   Your physician has recommended that you wear an event monitor. Event monitors are medical devices that record the heart's electrical activity. Doctors most often [place] these monitors to diagnose arrhythmias. Arrhythmias are problems with the speed or rhythm of the heartbeat. The monitor is a small, portable device. You can wear one while you do your normal daily activities. This is usually used to diagnose what is causing palpitations/syncope (passing out).--STOP Flecainide day you get the  Monitor  You have been referred to Dr Lynett Fish GI

## 2011-06-02 ENCOUNTER — Telehealth: Payer: Self-pay

## 2011-06-02 NOTE — Telephone Encounter (Signed)
Patient will have monitor put on 8/1/ @ 12:30

## 2011-06-06 ENCOUNTER — Other Ambulatory Visit: Payer: Self-pay | Admitting: Internal Medicine

## 2011-06-07 ENCOUNTER — Other Ambulatory Visit: Payer: BC Managed Care – PPO | Admitting: Gastroenterology

## 2011-06-07 ENCOUNTER — Encounter (INDEPENDENT_AMBULATORY_CARE_PROVIDER_SITE_OTHER): Payer: BC Managed Care – PPO

## 2011-06-07 DIAGNOSIS — I4891 Unspecified atrial fibrillation: Secondary | ICD-10-CM

## 2011-06-20 ENCOUNTER — Other Ambulatory Visit (INDEPENDENT_AMBULATORY_CARE_PROVIDER_SITE_OTHER): Payer: BC Managed Care – PPO

## 2011-06-20 ENCOUNTER — Other Ambulatory Visit: Payer: Self-pay | Admitting: Internal Medicine

## 2011-06-20 DIAGNOSIS — T887XXA Unspecified adverse effect of drug or medicament, initial encounter: Secondary | ICD-10-CM

## 2011-06-20 DIAGNOSIS — E785 Hyperlipidemia, unspecified: Secondary | ICD-10-CM

## 2011-06-20 DIAGNOSIS — K219 Gastro-esophageal reflux disease without esophagitis: Secondary | ICD-10-CM

## 2011-06-20 DIAGNOSIS — R109 Unspecified abdominal pain: Secondary | ICD-10-CM

## 2011-06-20 LAB — CBC WITH DIFFERENTIAL/PLATELET
Basophils Relative: 0.4 % (ref 0.0–3.0)
Eosinophils Absolute: 0 10*3/uL (ref 0.0–0.7)
HCT: 48.6 % (ref 39.0–52.0)
Hemoglobin: 16.3 g/dL (ref 13.0–17.0)
Lymphs Abs: 1.2 10*3/uL (ref 0.7–4.0)
MCHC: 33.6 g/dL (ref 30.0–36.0)
MCV: 98.3 fl (ref 78.0–100.0)
Monocytes Absolute: 0.5 10*3/uL (ref 0.1–1.0)
Neutro Abs: 3.7 10*3/uL (ref 1.4–7.7)
RBC: 4.95 Mil/uL (ref 4.22–5.81)

## 2011-06-20 LAB — H. PYLORI ANTIBODY, IGG: H Pylori IgG: NEGATIVE

## 2011-06-20 LAB — LIPID PANEL
HDL: 52.3 mg/dL (ref >39.00)
Triglycerides: 81 mg/dL (ref 0.0–149.0)

## 2011-06-20 LAB — HEPATIC FUNCTION PANEL
ALT: 13 U/L (ref 0–53)
Albumin: 4.5 g/dL (ref 3.5–5.2)
Total Protein: 6.9 g/dL (ref 6.0–8.3)

## 2011-06-20 LAB — CK: Total CK: 112 U/L (ref 7–232)

## 2011-06-22 ENCOUNTER — Telehealth: Payer: Self-pay

## 2011-06-22 NOTE — Telephone Encounter (Signed)
Message copied by Edgardo Roys on Thu Jun 22, 2011  9:59 AM ------      Message from: Pecola Lawless      Created: Thu Jun 22, 2011  6:47 AM       Please call Rx  for his statin  to drug store,[facility] [place].Complete stool cards & bring to appt after beach trip.

## 2011-06-22 NOTE — Telephone Encounter (Signed)
Cholesterol med was filled on 06/20/2011

## 2011-06-23 ENCOUNTER — Other Ambulatory Visit (INDEPENDENT_AMBULATORY_CARE_PROVIDER_SITE_OTHER): Payer: BC Managed Care – PPO

## 2011-06-23 DIAGNOSIS — Z1211 Encounter for screening for malignant neoplasm of colon: Secondary | ICD-10-CM

## 2011-06-23 LAB — HEMOCCULT GUIAC POC 1CARD (OFFICE): Card #2 Fecal Occult Blod, POC: NEGATIVE

## 2011-06-23 NOTE — Progress Notes (Signed)
Labs only

## 2011-06-27 ENCOUNTER — Encounter: Payer: Self-pay | Admitting: Internal Medicine

## 2011-07-20 ENCOUNTER — Telehealth: Payer: Self-pay | Admitting: Gastroenterology

## 2011-07-20 NOTE — Telephone Encounter (Signed)
Pt c/o some stomach discomfort and possible lactose intolerance issues. Requesting to be seen. Scheduled to to see Mike Gip PA 07/21/11@10am . Pt aware of appt date and time.

## 2011-07-21 ENCOUNTER — Ambulatory Visit: Payer: BC Managed Care – PPO | Admitting: Gastroenterology

## 2011-07-21 ENCOUNTER — Ambulatory Visit (INDEPENDENT_AMBULATORY_CARE_PROVIDER_SITE_OTHER): Payer: BC Managed Care – PPO | Admitting: Physician Assistant

## 2011-07-21 ENCOUNTER — Encounter: Payer: Self-pay | Admitting: Physician Assistant

## 2011-07-21 ENCOUNTER — Other Ambulatory Visit (INDEPENDENT_AMBULATORY_CARE_PROVIDER_SITE_OTHER): Payer: BC Managed Care – PPO

## 2011-07-21 VITALS — BP 122/70 | HR 68 | Ht 73.0 in | Wt 214.0 lb

## 2011-07-21 DIAGNOSIS — R1013 Epigastric pain: Secondary | ICD-10-CM

## 2011-07-21 DIAGNOSIS — Z86010 Personal history of colonic polyps: Secondary | ICD-10-CM

## 2011-07-21 LAB — HEPATIC FUNCTION PANEL
AST: 20 U/L (ref 0–37)
Alkaline Phosphatase: 76 U/L (ref 39–117)
Total Bilirubin: 1 mg/dL (ref 0.3–1.2)

## 2011-07-21 MED ORDER — GLYCOPYRROLATE 2 MG PO TABS: ORAL_TABLET | ORAL | Status: DC

## 2011-07-21 NOTE — Patient Instructions (Signed)
Please go to the basement level to have your labs drawn.  We have scheduled the Ultrasound at [facility], 1st floor, directions given.  We sent a prescription for Robinul Forte 2 mg to your pharmacy, [facility], [address]

## 2011-07-21 NOTE — Progress Notes (Signed)
Subjective:    Patient ID: Randy Cantu, male    DOB: 18-Jun-1951, 60 y.o.   MRN: [id number]  HPI Randy " Theodoro Grist" is a pleasant 60 year old male known to Dr. Arlyce Dice. He had a remote colonoscopy in 2000 which was negative except for hemorrhoids. Very remote EGD in 1990 showed a 3 cm hiatal hernia.  He comes in today to discuss procedures as he received a recall letter for followup colonoscopy and has been having some other issues. He says he had an ablation for atrial fibrillation in January of 2012 and was started on Topamax a period after that he started having stomach problems with epigastric discomfort and says he also became lactose intolerant. That had never been a problem in the past but he says he was unable to tolerate any fatty foods including a cheese,butter, ice cream etc. He complains of discomfort and a  churning sensation with lactose ingestion but  has not had any problems with gas bloating or diarrhea. He is started on protonix but developed a rash and then since then I was placed on omeprazole. The omeprazole also bothered his stomach,so he tried  Zegerid which did not help much and then since has been on Zantac 150 twice daily over the past few months which she feels helps a little. He's also been using antacids. He had gone on a lactose-free diet and started using Pepto-Bismol twice daily but continues to complain of a burning and knowing sensation in his upper abdomen which seems to be somewhat better after being. He has not had any nausea or vomiting his appetite has been okay his weight is down 3 or 4 pounds. Again his bowel movements have been normal during all of this time no melena or hematochezia. He does not use any regular NSAIDs. He was taken off of Permax up a few months back.  He has no complaint of dysphagia or odynophagia.    Review of Systems  Constitutional: Negative.   HENT: Negative.   Eyes: Negative.   Respiratory: Negative.   Cardiovascular: Negative.     Gastrointestinal: Positive for nausea and abdominal pain.  Genitourinary: Negative.   Musculoskeletal: Negative.   Skin: Negative.   Neurological: Negative.   Hematological: Negative.   Psychiatric/Behavioral: Negative.    Outpatient Prescriptions Prior to Visit  Medication Sig Dispense Refill  . pravastatin (PRAVACHOL) 40 MG tablet TAKE 1 TABLET BY MOUTH AT BEDTIME  90 tablet  3  . ranitidine (ZANTAC) 150 MG capsule Take 150 mg by mouth 2 (two) times daily.        . verapamil (CALAN) 120 MG tablet Take 120 mg by mouth daily.       Marland Kitchen zonisamide (ZONEGRAN) 100 MG capsule Take 300 mg by mouth at bedtime.        . diclofenac (CATAFLAM) 50 MG tablet as needed.      . fexofenadine (ALLEGRA) 180 MG tablet Take 90 mg by mouth daily.        . flecainide (TAMBOCOR) 150 MG tablet TAKE 1 TABLET BY MOUTH TWICE DAILY  180 tablet  2       Allergies  Allergen Reactions  . Pantoprazole Sodium     REACTION: rash from this or from  topical disinfectant   Patient Active Problem List  Diagnoses  . HYPERLIPIDEMIA  . GILBERT'S SYNDROME  . MIGRAINE VARIANT  . ATRIAL FIBRILLATION  . ALLERGIC RHINITIS  . LOW BACK PAIN, ACUTE  . WEAKNESS, MUSCLE  . SLEEP APNEA  .  MUSCLE STRAIN, HAMSTRING MUSCLE  . GERD  . ABDOMINAL PAIN, EPIGASTRIC    Outpatient Encounter Prescriptions as of 07/21/2011  Medication Sig Dispense Refill  . bismuth subsalicylate (PEPTO BISMOL) 262 MG chewable tablet Chew 524 mg by mouth daily as needed.        . cetirizine (ZYRTEC) 10 MG tablet Take 10 mg by mouth daily.        . flecainide (TAMBOCOR) 150 MG tablet Take 150 mg by mouth daily.        . pravastatin (PRAVACHOL) 40 MG tablet TAKE 1 TABLET BY MOUTH AT BEDTIME  90 tablet  3  . ranitidine (ZANTAC) 150 MG capsule Take 150 mg by mouth 2 (two) times daily.        . verapamil (CALAN) 120 MG tablet Take 120 mg by mouth daily.       Marland Kitchen zonisamide (ZONEGRAN) 100 MG capsule Take 300 mg by mouth at bedtime.        . diclofenac  (CATAFLAM) 50 MG tablet as needed.      . fexofenadine (ALLEGRA) 180 MG tablet Take 90 mg by mouth daily.        Marland Kitchen glycopyrrolate (ROBINUL-FORTE) 2 MG tablet Take 1 tab daily in the morning.  30 tablet  1  . DISCONTD: flecainide (TAMBOCOR) 150 MG tablet TAKE 1 TABLET BY MOUTH TWICE DAILY  180 tablet  2   Allergies  Allergen Reactions  . Pantoprazole Sodium     REACTION: rash from this or from  topical disinfectant    Objective:   Physical Exam Well-developed white male distress, alert and oriented x3, pleasant, blood pressure 122/70 pulse 68 HEENT; nontraumatic normocephalic EOMI PERRLA sclera anicteric,Neck; Supple no JVD, Cardiovascular; regular rate and rhythm with S1-S2 no murmur or gallop Pulmonary, clear bilaterally Abdomen; soft minimally tender in the epigastrium there is no guarding no rebound no palpable mass or hepatosplenomegaly bowel sounds are active, Rectal; not done, Extremities; no clubbing cyanosis or edema skin warm and dry, Psych; mood and affect normal an appropriate       Assessment & Plan:  #76 60 year old male with several month history of epigastric discomfort refractory to PPI therapy and associated with fatty food intolerance/question lactose intolerant. Symptoms on set after starting Permax just since been discontinued. Etiology is not clear, however we are to rule out underlying gallbladder disease, possible peptic ulcer disease.  #2 Colon neoplasia surveillance Patient due for followup colonoscopy for screening  Plan; We'll schedule for upper endoscopy and colonoscopy with Dr. Arlyce Dice, procedures were discussed in detail with the patient. He is not anticoagulated at this time. Continue low-fat diet Trial of Robinul Forte 2 mg every morning Continue Zantac 150 twice a day Schedule for upper abdominal ultrasound  #3 History of atrial fibrillation on status post ablation January 2012.

## 2011-07-24 ENCOUNTER — Telehealth: Payer: Self-pay | Admitting: *Deleted

## 2011-07-24 NOTE — Telephone Encounter (Signed)
Message copied by Daphine Deutscher on Mon Jul 24, 2011 12:49 PM ------      Message from: [place], [place] S      Created: Mon Jul 24, 2011 11:15 AM       Please let Randy Cantu know his labs are normal,pancreas blood test normal this time -he should be set up for an ultrasound

## 2011-07-24 NOTE — Telephone Encounter (Signed)
Spoke with patient and gave him the results. He states he is feeling a little better with the medication he started on Friday.

## 2011-07-24 NOTE — Progress Notes (Signed)
I agree with assessment and plans 

## 2011-07-24 NOTE — Telephone Encounter (Signed)
Left a message to call back.

## 2011-07-26 ENCOUNTER — Ambulatory Visit (HOSPITAL_COMMUNITY)
Admission: RE | Admit: 2011-07-26 | Discharge: 2011-07-26 | Disposition: A | Discharge status: Home / Self Care | Payer: BC Managed Care – PPO | Source: Ambulatory Visit | Attending: Physician Assistant | Admitting: Physician Assistant

## 2011-07-26 ENCOUNTER — Telehealth: Payer: Self-pay | Admitting: *Deleted

## 2011-07-26 DIAGNOSIS — R1013 Epigastric pain: Secondary | ICD-10-CM

## 2011-07-26 NOTE — Telephone Encounter (Signed)
Message copied by Daphine Deutscher on Wed Jul 26, 2011  2:22 PM ------      Message from: [place], [place] S      Created: Wed Jul 26, 2011 12:00 PM       Please let Jayson know the [place] is normal. Would wait for endoscopy, then if that is negative and he is still having trouble he needs cck-hida scan

## 2011-07-26 NOTE — Telephone Encounter (Signed)
Left a message for patient to call me. 

## 2011-07-27 NOTE — Telephone Encounter (Signed)
Left a message for patient to call me. 

## 2011-07-28 ENCOUNTER — Encounter: Payer: Self-pay | Admitting: Gastroenterology

## 2011-07-28 ENCOUNTER — Other Ambulatory Visit: Payer: Self-pay | Admitting: *Deleted

## 2011-07-28 ENCOUNTER — Ambulatory Visit (AMBULATORY_SURGERY_CENTER): Payer: BC Managed Care – PPO | Admitting: *Deleted

## 2011-07-28 DIAGNOSIS — R1013 Epigastric pain: Secondary | ICD-10-CM

## 2011-07-28 DIAGNOSIS — Z1211 Encounter for screening for malignant neoplasm of colon: Secondary | ICD-10-CM

## 2011-07-28 MED ORDER — PEG-KCL-NACL-NASULF-NA ASC-C 100 G PO SOLR: ORAL | Status: DC

## 2011-07-28 NOTE — Telephone Encounter (Signed)
Spoke with patient and gave him results. He is not scheduled for Endo but per Mike Gip, PA dictation patient needs a ECOL. Scheduled patient on 08/04/11 with Dr. Arlyce Dice for ECL arrival at 2:00 PM for 3:00 PM procedure. Pre vist scheduled for 07/28/11 at 11:00 AM.

## 2011-08-04 ENCOUNTER — Encounter: Payer: Self-pay | Admitting: Gastroenterology

## 2011-08-04 ENCOUNTER — Ambulatory Visit (AMBULATORY_SURGERY_CENTER): Payer: BC Managed Care – PPO | Admitting: Gastroenterology

## 2011-08-04 DIAGNOSIS — Z1211 Encounter for screening for malignant neoplasm of colon: Secondary | ICD-10-CM

## 2011-08-04 DIAGNOSIS — R1013 Epigastric pain: Secondary | ICD-10-CM

## 2011-08-04 MED ORDER — SODIUM CHLORIDE 0.9 % IV SOLN: 500.0000 mL | INTRAVENOUS | Status: DC

## 2011-08-04 MED ORDER — HYOSCYAMINE SULFATE ER 0.375 MG PO TB12: 0.3750 mg | ORAL_TABLET | Freq: Two times a day (BID) | ORAL | Status: DC | PRN

## 2011-08-04 NOTE — Patient Instructions (Signed)
PLEASE FOLLOW DISCHARGE INSTRUCTIONS GIVEN TODAY. CALL us WITH ANY QUESTIONS OR CONCERNS. BOTH EXAMS NORMAL TODAY. OFFICE VISIT WITH DR.KAPLN IN 2-3 WEEKS. CALL OFFICE TO MAKE APPOINTMENT. RESUME CURRENT MEDICATIONS TODAY. REPEAT COLONOSCOPY IN 10 YEARS.

## 2011-08-07 ENCOUNTER — Telehealth: Payer: Self-pay

## 2011-08-07 NOTE — Telephone Encounter (Signed)
Left message

## 2011-08-16 ENCOUNTER — Encounter: Payer: Self-pay | Admitting: Gastroenterology

## 2011-08-16 ENCOUNTER — Ambulatory Visit (INDEPENDENT_AMBULATORY_CARE_PROVIDER_SITE_OTHER): Payer: BC Managed Care – PPO | Admitting: Gastroenterology

## 2011-08-16 VITALS — BP 118/70 | HR 72 | Ht 73.0 in | Wt 217.8 lb

## 2011-08-16 DIAGNOSIS — R1013 Epigastric pain: Secondary | ICD-10-CM

## 2011-08-16 MED ORDER — HYOSCYAMINE SULFATE ER 0.375 MG PO TB12: 0.3750 mg | ORAL_TABLET | Freq: Two times a day (BID) | ORAL | Status: DC | PRN

## 2011-08-16 NOTE — Progress Notes (Signed)
History of Present Illness:  [name] has returned following upper and lower endoscopy. Both exams were negative. On a regimen of hyomax his symptoms have entirely subsided. He has not tried taking foods containing lactose    Review of Systems: Pertinent positive and negative review of systems were noted in the above HPI section. All other review of systems were otherwise negative.    Current Medications, Allergies, Past Medical History, Past Surgical History, Family History and Social History were reviewed in [facility] electronic medical record  Vital signs were reviewed in today's medical record. Physical Exam: General: Well developed , well nourished, no acute distress

## 2011-08-16 NOTE — Assessment & Plan Note (Signed)
Symptoms have resolved with anticholinergics. I still cannot explain why he became lactose intolerant after his ablation procedure.  The patient is #1 continue hyomax when necessary #2 instructed the patient to rechallenge himself with a lactose containing products

## 2011-08-16 NOTE — Patient Instructions (Signed)
We will renew your RX and send them to your pharmacy Follow up as needed

## 2011-10-18 ENCOUNTER — Encounter: Payer: Self-pay | Admitting: Internal Medicine

## 2011-10-18 ENCOUNTER — Ambulatory Visit (INDEPENDENT_AMBULATORY_CARE_PROVIDER_SITE_OTHER): Payer: BC Managed Care – PPO | Admitting: Internal Medicine

## 2011-10-18 VITALS — BP 126/74 | HR 49 | Ht 73.0 in | Wt 223.0 lb

## 2011-10-18 DIAGNOSIS — I4891 Unspecified atrial fibrillation: Secondary | ICD-10-CM

## 2011-10-18 MED ORDER — FLECAINIDE ACETATE 100 MG PO TABS: 100.0000 mg | ORAL_TABLET | Freq: Two times a day (BID) | ORAL | Status: DC

## 2011-10-18 MED ORDER — FLECAINIDE ACETATE 100 MG PO TABS: 150.0000 mg | ORAL_TABLET | Freq: Two times a day (BID) | ORAL | Status: DC

## 2011-10-18 NOTE — Assessment & Plan Note (Signed)
Well controlled with flecainide He reports nystagmus with flecainide.  We will therefore decrease flecainide to 100mg  BID Consider rhythmol if he does not tolerate flecainide.  CHADSVASC score is 0.  No anticoagulation is therefore recommended.  Return in 12 months

## 2011-10-18 NOTE — Patient Instructions (Addendum)
Your physician wants you to follow-up in: 12 months with Dr Jacquiline Doe will receive a reminder letter in the mail two months in advance. If you don't receive a letter, please call our office to schedule the follow-up appointment.  Your physician has recommended you make the following change in your medication:  1) Flecainide 100mg  twice daily

## 2011-10-18 NOTE — Progress Notes (Signed)
The patient presents today for routine electrophysiology followup.  Since his last visit to our clinic, he reports doing well.  He reports having afib lasting 1-2 times several times per month.  Episodes are brought on by exertion. Today, he denies symptoms of  chest pain, shortness of breath, orthopnea, PND, lower extremity edema, dizziness, presyncope, syncope, or neurologic sequela.    He has nystagmus with flecainide.   Past Medical History  Diagnosis Date  . Allergic rhinitis   . HLD (hyperlipidemia)   . PAF (paroxysmal atrial fibrillation)     s/p afib ablation 11/17/10  . Lactose intolerance    Past Surgical History  Procedure Date  . Tonsillectomy   . Atrial fibrillation ablation 11/17/10    Current Outpatient Prescriptions  Medication Sig Dispense Refill  . diclofenac (CATAFLAM) 50 MG tablet as needed.      . fexofenadine (ALLEGRA) 180 MG tablet Take 90 mg by mouth daily.        . flecainide (TAMBOCOR) 150 MG tablet Take 150 mg by mouth daily.        . pravastatin (PRAVACHOL) 40 MG tablet TAKE 1 TABLET BY MOUTH AT BEDTIME  90 tablet  3  . verapamil (CALAN) 120 MG tablet Take 120 mg by mouth daily.         Allergies  Allergen Reactions  . Pantoprazole Sodium     REACTION: rash from this or from  topical disinfectant    History   Social History  . Marital Status: Married    Spouse Name: N/A    Number of Children: 2  . Years of Education: N/A   Occupational History  . DOCTOR/EYE    Social History Main Topics  . Smoking status: Never Smoker   . Smokeless tobacco: Never Used  . Alcohol Use: 1.2 oz/week    2 Glasses of wine per week     occasional  . Drug Use: No  . Sexually Active: Not on file   Other Topics Concern  . 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 Cancer    . Peripheral vascular disease Mother     afib in her 63s  . Aplastic anemia Father     . Prostate cancer Maternal Grandfather   . Leukemia Paternal Grandfather   . Colon cancer Neg Hx    Physical Exam: Filed Vitals:   10/18/11 1019  BP: 126/74  Pulse: 49  Height: 6\' 1"  (1.854 m)  Weight: 223 lb (101.152 kg)    GEN- The patient is well appearing, alert and oriented x 3 today.   Head- normocephalic, atraumatic Eyes-  Sclera clear, conjunctiva pink Ears- hearing intact Oropharynx- clear Neck- supple, no JVP Lymph- no cervical lymphadenopathy Lungs- Clear to ausculation bilaterally, normal work of breathing Heart- Regular rate and rhythm, no murmurs, rubs or gallops, PMI not laterally displaced GI- soft, NT, ND, + BS Extremities- no clubbing, cyanosis, or edema MS- no significant deformity or atrophy Skin- no rash or lesion Psych- euthymic mood, full affect Neuro- strength and sensation are intact   EKG- sinus bradycardia 49 bpm, PR 204, QRS 110, Qtc 404, incomplete RBBB, otherwise normal ekg

## 2011-11-01 ENCOUNTER — Encounter: Payer: Self-pay | Admitting: Internal Medicine

## 2012-03-06 ENCOUNTER — Other Ambulatory Visit: Payer: Self-pay | Admitting: Internal Medicine

## 2012-03-06 ENCOUNTER — Other Ambulatory Visit: Payer: Self-pay

## 2012-03-06 MED ORDER — VERAPAMIL HCL 120 MG PO TABS: 120.0000 mg | ORAL_TABLET | Freq: Every day | ORAL | Status: DC

## 2012-03-18 ENCOUNTER — Other Ambulatory Visit: Payer: Self-pay | Admitting: Internal Medicine

## 2012-03-18 ENCOUNTER — Other Ambulatory Visit (HOSPITAL_COMMUNITY): Payer: Self-pay | Admitting: *Deleted

## 2012-03-18 MED ORDER — VERAPAMIL HCL 120 MG PO TABS: 120.0000 mg | ORAL_TABLET | Freq: Every day | ORAL | Status: DC

## 2012-03-18 MED ORDER — FLECAINIDE ACETATE 100 MG PO TABS: 100.0000 mg | ORAL_TABLET | Freq: Two times a day (BID) | ORAL | Status: DC

## 2012-03-18 NOTE — Telephone Encounter (Signed)
Patient phone number for return call  [phone]

## 2012-03-18 NOTE — Telephone Encounter (Addendum)
Refill f/u- Verapamil   Patient calling regarding RX verapamil (CALAN) 120 MG tablet.  Patient says this was suppose to be the extended release tablet not the regular tablet.  He also recvd 30 day suppy instead of 90.  Patient request return call regarding this matter [phone]. [facility]ified [facility]      Patient also noted that he needs flecanide refill.

## 2012-03-19 ENCOUNTER — Telehealth: Payer: Self-pay | Admitting: Internal Medicine

## 2012-03-19 NOTE — Telephone Encounter (Signed)
Pt had been getting the Verapamil extended release capsules before this most recent fill and this time Rx was sent in for the regular 120 mg.  I confirmed pt should be back on Extended release if that is what the patient has been taking.

## 2012-03-19 NOTE — Telephone Encounter (Signed)
Please return call to [facility] [phone] regarding verapamil medication.  Need clarification of RX change.

## 2012-03-20 ENCOUNTER — Telehealth: Payer: Self-pay | Admitting: Internal Medicine

## 2012-03-20 NOTE — Telephone Encounter (Signed)
[facility] re dosage change on flecanide, pls call [phone]

## 2012-03-22 ENCOUNTER — Other Ambulatory Visit: Payer: Self-pay | Admitting: *Deleted

## 2012-03-22 MED ORDER — FLECAINIDE ACETATE 150 MG PO TABS: ORAL_TABLET | ORAL | Status: DC

## 2012-03-22 NOTE — Telephone Encounter (Signed)
Spoke with pharmacist and his medications have been corrected.  The Verapamil was corrected on 03/19/12 per the pharmacist and the Flecainide was filled for 100mg  take 1 1/2 tablets by mouth twice daily but he was only given a 30 day supply.  I have called in the 150mg  tablets with the directions of take 1 po bid I gave him #180 w 3 refills.  lmom for patient with corrections

## 2012-05-28 ENCOUNTER — Telehealth: Payer: Self-pay | Admitting: Internal Medicine

## 2012-05-28 NOTE — Telephone Encounter (Signed)
Pt needs to get insurance and needs to have a ct scan done and wants to talk about it

## 2012-05-28 NOTE — Telephone Encounter (Signed)
lmom for patient to return my call 

## 2012-05-28 NOTE — Telephone Encounter (Signed)
lmom to return my call

## 2012-05-29 NOTE — Telephone Encounter (Signed)
As his symptoms have completely resolved, no workup is advised at this point.  The normal endoscopy is reassuring.  Fayrene Fearing [place]roe Vogan,MD

## 2012-05-29 NOTE — Telephone Encounter (Signed)
Patient is referencing to Dr Jenel Lucks 02/22/12 comment in regards to needing a CT for esophageal necrosis.( see below)  He has however had an Endoscopy since this visit in 08/03/2012 which was completely normal.  "The upper, middle, and distal third of the esophagus were carefully inspected and no abnormalities were noted." This is referenced from Dr Marzetta Board note, with the impression being a normal EGD.  I have discussed with Dr Johney Frame and he feels the endoscopy results should be sufficient but the fact that his symptoms have completely resolved are of most significance.  He does not feel that a CT of the chest is warranted at this time and will be happy to write a letter to insurance company to support this.  [name] is going to contact his insurance company and call me back to let me know if this will suffice.       ABDOMINAL PAIN, EPIGASTRIC - Hillis Range, MD 02/22/2011 8:53 PM Signed  I find the patients recent GI symptoms worrisome. Given his recent afib ablation, symptoms may have been due to esophageal injury from RF ablation (commonly symptoms occur 4-6 weeks post ablation). Fortunately, he feels clear that his symptoms have been absent for at least 2 weeks. I have strongly recommended that he proceed with EGD to evaluate his esophagus at this time, however, he declines. I have also recommended chest CT with contrast to evaluate for esophageal necrosis, atrial/esophageal fistula and he is clear that he wishes to decline. He is aware of my concerns.  As his symptoms have completely resolved for 2 weeks, I am hopeful that he will do well. I have made it very clear that he should contact me immediately should he develop fevers, chills, further odynophagia, GI symptoms, or any other concerns to arrange immediate workup.  Pradaxa is another potential cause of his GI symptoms. We will therefore stop pradaxa at this time (as above).

## 2012-06-03 ENCOUNTER — Encounter: Payer: Self-pay | Admitting: *Deleted

## 2012-06-03 ENCOUNTER — Encounter: Payer: Self-pay | Admitting: Internal Medicine

## 2012-06-07 ENCOUNTER — Ambulatory Visit (INDEPENDENT_AMBULATORY_CARE_PROVIDER_SITE_OTHER): Payer: BC Managed Care – PPO | Admitting: Internal Medicine

## 2012-06-07 ENCOUNTER — Encounter: Payer: Self-pay | Admitting: Internal Medicine

## 2012-06-07 VITALS — BP 122/70 | HR 52 | Temp 98.1°F | Wt 226.4 lb

## 2012-06-07 DIAGNOSIS — G479 Sleep disorder, unspecified: Secondary | ICD-10-CM

## 2012-06-07 MED ORDER — PRAVASTATIN SODIUM 40 MG PO TABS: ORAL_TABLET | ORAL | Status: DC

## 2012-06-07 MED ORDER — ZOSTER VACCINE LIVE 19400 UNT/0.65ML ~~LOC~~ SOLR: 0.6500 mL | Freq: Once | SUBCUTANEOUS | Status: DC

## 2012-06-07 NOTE — Progress Notes (Signed)
  Subjective:    Patient ID: Randy Cantu, male    DOB: 01/30/1951, 61 y.o.   MRN: [id number]  HPI For at least 3 months and possibly as long as 6 months; his wife has noted that he "jumps" in his sleep intermittently throughout the night. This will occur for seconds like an "electric shock" and then resolve, only to recur. This is associated with  talking in his sleep and "hollering" in his sleep.  He does have snoring; is been no description of apnea. He had a sleep study at least 3 years ago and  as part of evaluation of paroxysmal atrial fibrillation.  He has no difficulty going to sleep and denies frequent awakenings. He sleeps up to 9 hours. He is not describing any restless leg activity  There is no personal or family history of any sleep disorder.    Review of Systems He is otherwise asymptomatic, denying change in weight, fatigue, or constitutional symptoms of fever, chills, or sweats.  He was treated the headache clinic for migraine.     Objective:   Physical Exam Gen.:  well-nourished; in no acute distress Eyes: Extraocular motion intact; no lid lag or proptosis  Neck: No thyromegaly or nodules. Oropharynx: No crowding, exudate, or erythema Heart: Normal rhythm ;slow rate without significant murmur, gallop, or extra heart sounds Lungs: Chest clear to auscultation without rales,rales, wheezes Neuro:Deep tendon reflexes are equal and within normal limits; no tremor  Skin: Warm and dry without significant lesions or rashes; no onycholysis Psych: Normally communicative and interactive; no abnormal mood or affect clinically.         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 History of snoring without apnea.  Plan: Evaluation by Dr. Marylou Flesher is recommended to define this process.

## 2012-06-10 NOTE — Patient Instructions (Signed)
Please  schedule fasting Labs : BMET,Lipids, hepatic panel, CK, TSH in 10 weeks. PLEASE BRING THESE INSTRUCTIONS TO FOLLOW UP  LAB APPOINTMENT.This will guarantee correct labs are drawn, eliminating need for repeat blood sampling ( needle sticks ! ). Diagnoses /Codes: 409.8,119.14

## 2012-06-25 ENCOUNTER — Other Ambulatory Visit: Payer: Self-pay | Admitting: Internal Medicine

## 2012-08-22 ENCOUNTER — Other Ambulatory Visit: Payer: Self-pay | Admitting: Internal Medicine

## 2012-08-23 ENCOUNTER — Other Ambulatory Visit: Payer: Self-pay | Admitting: Cardiology

## 2012-08-23 ENCOUNTER — Other Ambulatory Visit (INDEPENDENT_AMBULATORY_CARE_PROVIDER_SITE_OTHER): Payer: BC Managed Care – PPO

## 2012-08-23 ENCOUNTER — Telehealth: Payer: Self-pay

## 2012-08-23 DIAGNOSIS — T887XXA Unspecified adverse effect of drug or medicament, initial encounter: Secondary | ICD-10-CM

## 2012-08-23 DIAGNOSIS — E785 Hyperlipidemia, unspecified: Secondary | ICD-10-CM

## 2012-08-23 DIAGNOSIS — F515 Nightmare disorder: Secondary | ICD-10-CM

## 2012-08-23 LAB — LIPID PANEL
Cholesterol: 170 mg/dL (ref 0–200)
HDL: 45.3 mg/dL (ref >39.00)
LDL Cholesterol: 102 mg/dL — ABNORMAL HIGH (ref 0–99)
Triglycerides: 113 mg/dL (ref 0.0–149.0)
VLDL: 22.6 mg/dL (ref 0.0–40.0)

## 2012-08-23 LAB — BASIC METABOLIC PANEL
Chloride: 105 mEq/L (ref 96–112)
Creatinine, Ser: 1.2 mg/dL (ref 0.4–1.5)
Potassium: 4.3 mEq/L (ref 3.5–5.1)
Sodium: 139 mEq/L (ref 135–145)

## 2012-08-23 LAB — TSH: TSH: 2.24 u[IU]/mL (ref 0.35–5.50)

## 2012-08-23 LAB — HEPATIC FUNCTION PANEL
Albumin: 3.8 g/dL (ref 3.5–5.2)
Total Protein: 6.6 g/dL (ref 6.0–8.3)

## 2012-08-23 LAB — CK: Total CK: 85 U/L (ref 7–232)

## 2012-08-23 MED ORDER — VERAPAMIL HCL 120 MG PO TABS: 120.0000 mg | ORAL_TABLET | Freq: Every day | ORAL | Status: DC

## 2012-08-23 NOTE — Telephone Encounter (Signed)
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 History of snoring without apnea.  Plan: Evaluation by Dr. Marylou Flesher is recommended to define this process.

## 2012-08-23 NOTE — Telephone Encounter (Signed)
Pt states going to [facility] today for annual cholesterol check. Pt asked have referral been done for neuro? Pt states willing to wait until [facility] and [facility] get back because he wants a call back from them. Plz advise    MW

## 2012-08-23 NOTE — Addendum Note (Signed)
Addended by: Arnette Norris on: 08/23/2012 09:48 AM   Modules accepted: Orders

## 2012-08-23 NOTE — Telephone Encounter (Signed)
Patient Instructions     Please schedule fasting Labs : BMET,Lipids, hepatic panel, CK, TSH in 10 weeks. PLEASE BRING THESE INSTRUCTIONS TO FOLLOW UP LAB APPOINTMENT.This will guarantee correct labs are drawn, eliminating need for repeat blood sampling ( needle sticks ! ). Diagnoses /Codes: 161.0,960.45

## 2012-08-23 NOTE — Telephone Encounter (Signed)
Ref put in. msg left for the patient      KP

## 2012-08-24 ENCOUNTER — Encounter: Payer: Self-pay | Admitting: Internal Medicine

## 2012-09-16 ENCOUNTER — Other Ambulatory Visit: Payer: Self-pay | Admitting: Internal Medicine

## 2012-11-13 ENCOUNTER — Other Ambulatory Visit: Payer: Self-pay | Admitting: *Deleted

## 2012-11-13 MED ORDER — VERAPAMIL HCL 120 MG PO TABS: 120.0000 mg | ORAL_TABLET | Freq: Every day | ORAL | Status: DC

## 2012-11-22 ENCOUNTER — Encounter: Payer: Self-pay | Admitting: Internal Medicine

## 2012-11-22 ENCOUNTER — Ambulatory Visit (INDEPENDENT_AMBULATORY_CARE_PROVIDER_SITE_OTHER): Payer: BC Managed Care – PPO | Admitting: Internal Medicine

## 2012-11-22 VITALS — BP 146/82 | HR 54 | Ht 73.0 in | Wt 234.1 lb

## 2012-11-22 DIAGNOSIS — I4891 Unspecified atrial fibrillation: Secondary | ICD-10-CM

## 2012-11-22 DIAGNOSIS — E785 Hyperlipidemia, unspecified: Secondary | ICD-10-CM

## 2012-11-22 MED ORDER — VERAPAMIL HCL ER 120 MG PO CP24: 120.0000 mg | ORAL_CAPSULE | Freq: Every day | ORAL | Status: DC

## 2012-11-22 MED ORDER — PRAVASTATIN SODIUM 40 MG PO TABS: 40.0000 mg | ORAL_TABLET | Freq: Every day | ORAL | Status: DC

## 2012-11-22 MED ORDER — FLECAINIDE ACETATE 150 MG PO TABS: ORAL_TABLET | ORAL | Status: DC

## 2012-11-22 NOTE — Assessment & Plan Note (Signed)
Stable No change required today  

## 2012-11-22 NOTE — Patient Instructions (Signed)
Your physician wants you to follow-up in: 12 months with Dr Allred You will receive a reminder letter in the mail two months in advance. If you don't receive a letter, please call our office to schedule the follow-up appointment.  

## 2012-11-22 NOTE — Addendum Note (Signed)
Addended by: Dennis Bast F on: 11/22/2012 03:28 PM   Modules accepted: Orders

## 2012-11-22 NOTE — Progress Notes (Signed)
PCP: Marga Melnick, MD  Randy Cantu is a 62 y.o. male who presents today for routine electrophysiology followup.  Since last being seen in our clinic, the patient reports doing very well.  His afib is well controlled presently.  Today, he denies symptoms of palpitations, chest pain, shortness of breath,  lower extremity edema, dizziness, presyncope, or syncope.  The patient is otherwise without complaint today.   Past Medical History  Diagnosis Date  . Allergic rhinitis   . HLD (hyperlipidemia)   . PAF (paroxysmal atrial fibrillation)     s/p afib ablation 11/17/10  . Lactose intolerance    Past Surgical History  Procedure Date  . Tonsillectomy   . Atrial fibrillation ablation 11/17/10    PVI by Dr Johney Frame    Current Outpatient Prescriptions  Medication Sig Dispense Refill  . clonazePAM (KLONOPIN) 0.5 MG tablet       . diclofenac (CATAFLAM) 50 MG tablet as needed.      . fexofenadine (ALLEGRA) 180 MG tablet Take 90 mg by mouth daily.        . flecainide (TAMBOCOR) 150 MG tablet One po bid  180 tablet  3  . pravastatin (PRAVACHOL) 40 MG tablet TAKE 1 TABLET BY MOUTH EVERY NIGHT AT BEDTIME  90 tablet  3  . verapamil (VERELAN PM) 120 MG 24 hr capsule Take 120 mg by mouth at bedtime.      . zoster vaccine live, PF, (ZOSTAVAX) 16109 UNT/0.65ML injection Inject 19,400 Units into the skin once.  1 each  0    Physical Exam: Filed Vitals:   11/22/12 1445  BP: 146/82  Pulse: 54  Height: 6\' 1"  (1.854 m)  Weight: 234 lb 1.9 oz (106.196 kg)    GEN- The patient is well appearing, alert and oriented x 3 today.   Head- normocephalic, atraumatic Eyes-  Sclera clear, conjunctiva pink Ears- hearing intact Oropharynx- clear Lungs- Clear to ausculation bilaterally, normal work of breathing Heart- Regular rate and rhythm, no murmurs, rubs or gallops, PMI not laterally displaced GI- soft, NT, ND, + BS Extremities- no clubbing, cyanosis, or edema  ekg today reveals sinus rhythm 59 bpm, PR  200 msec, nonspecific St/T changes  Assessment and Plan:

## 2012-11-22 NOTE — Assessment & Plan Note (Signed)
Well controlled CHADS2VASC2 score is 0.  He does not meet guidelines for anticoagulation at this time. Check flecainide level today.

## 2012-12-11 ENCOUNTER — Telehealth: Payer: Self-pay | Admitting: Internal Medicine

## 2012-12-11 NOTE — Telephone Encounter (Signed)
New problem   Flecainide  100 mg . 90 days supply.    [facility] on [address]

## 2012-12-12 MED ORDER — FLECAINIDE ACETATE 150 MG PO TABS: ORAL_TABLET | ORAL | Status: DC

## 2013-09-10 ENCOUNTER — Other Ambulatory Visit: Payer: Self-pay | Admitting: Internal Medicine

## 2013-09-12 ENCOUNTER — Other Ambulatory Visit: Payer: Self-pay | Admitting: Internal Medicine

## 2013-09-18 ENCOUNTER — Other Ambulatory Visit: Payer: Self-pay | Admitting: Internal Medicine

## 2013-09-19 ENCOUNTER — Other Ambulatory Visit: Payer: Self-pay | Admitting: *Deleted

## 2013-09-19 MED ORDER — FLECAINIDE ACETATE 150 MG PO TABS: ORAL_TABLET | ORAL | Status: DC

## 2013-09-24 ENCOUNTER — Telehealth: Payer: Self-pay | Admitting: *Deleted

## 2013-09-24 NOTE — Telephone Encounter (Signed)
Medication refill please call pt on cell.

## 2013-10-17 ENCOUNTER — Encounter: Payer: Self-pay | Admitting: Neurology

## 2013-10-17 ENCOUNTER — Encounter (INDEPENDENT_AMBULATORY_CARE_PROVIDER_SITE_OTHER): Payer: Self-pay

## 2013-10-17 ENCOUNTER — Ambulatory Visit (INDEPENDENT_AMBULATORY_CARE_PROVIDER_SITE_OTHER): Payer: BC Managed Care – PPO | Admitting: Neurology

## 2013-10-17 ENCOUNTER — Other Ambulatory Visit: Payer: Self-pay | Admitting: Neurology

## 2013-10-17 VITALS — BP 128/80 | HR 57 | Resp 16 | Ht 72.5 in | Wt 223.0 lb

## 2013-10-17 DIAGNOSIS — G4752 REM sleep behavior disorder: Secondary | ICD-10-CM

## 2013-10-17 DIAGNOSIS — G4761 Periodic limb movement disorder: Secondary | ICD-10-CM

## 2013-10-17 MED ORDER — CLONAZEPAM 0.5 MG PO TABS: 0.2500 mg | ORAL_TABLET | Freq: Every day | ORAL | Status: DC

## 2013-10-17 NOTE — Progress Notes (Signed)
[facility]  Provider:  Melvyn Cantu, M Cantu  Referring Provider: Pecola Lawless, MD Primary Care Physician:  Randy Melnick, MD  Chief Complaint  Patient presents with  . 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 He  is seen here as a  revisit  from Dr. Alwyn Cantu for persistent daytime sleepiness in the absence of apnea.  The patient was evaluated in Dec 2013 for EDS, underwent a PSG on 10-17-12 - but had only frequent PLMs , not associated with a significant arousal index. His  AHI was low, (1.7) .  Dr. Hyacinth Cantu had and Randy Cantu sleepiness score at 16 points during our visit on 11-01-12. He had also reported diplopia intermittent and it was concerned that this may be related to his treatment with flecainide also known as tumble core for his atrial fibrillation. His wife noted that he has been numb still acting out dreams behavior, never leaving the bed. He had been prescribed Klonopin which he felt that in all negative or positive effect he may have slept with a deeper or more restorative quality. Unfortunately, his wife felt that he had personality changes and became more of a bruit or impulsive in response to taking the medication.  Therefore he discontinued Klonipin.  These REM behaviors have still continued, at times he may be a sleep talking sometimes he will cry out or holler loudly . He never leaves his bed , he sometimes thrashing about , often  talking. He seems to produce these behaviors at 3-5 AM, not within the first 90 minutes of sleep.  This seems to be separate form : characterized as jerking upper movements, his wife has used the term myoclonus, twitching and jerking. The is occurs earlier with the first onset of sleep .   Review of Systems: Out of a complete 14 system review, the patient complains of only the following symptoms, and all other reviewed systems are negative.  He has repeatedly denied  having RLS.  There is no upset because of nocturia, he may sometimes more or have coughing at night but she is not hypoxic, he does not have  Palpitations,  he does notwake up with an anxiety or panic spell. The patient's Cantu score today is still endorsed at 13 points this fatigue severity score of 25 points the depression score at 0 points. There have been no reports of this autonomic spells, fainting or diaphoresis at night. The patient not take the lower dorsal flecainide 150 mg twice a day attempts to reduce the dose of flecainide resulted in the atrial fibrillation becoming worse. The diplopia has been intermittent- rare -  very rare,  and is not related to the fatigue,  not correlated with ptosis, nor  dilated pupil.  There has been no associated swallowing difficulty, no slurred speech and no respiratory distress. Vertical diplopia occurred about 4 times in the last 24 month.          History   Social History  . Marital Status: Married    Spouse Name: N/A    Number of Children: 2  . Years of Education: N/A   Occupational History  . DOCTOR/EYE    Social History Main Topics  . Smoking status: Never Smoker   . Smokeless tobacco: Never Used  . Alcohol Use: 4.2 oz/week    7 Glasses of wine per week  . Drug Use: No  . Sexual Activity:  Not on file   Other Topics Concern  . 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 Cancer    . Peripheral vascular disease Mother     afib in her 62s  . Aplastic anemia Father   . Prostate cancer Maternal Grandfather   . Leukemia Paternal Grandfather   . Colon cancer Neg Hx     Past Medical History  Diagnosis Date  . Allergic rhinitis   . HLD (hyperlipidemia)   . PAF (paroxysmal atrial fibrillation)     s/p afib ablation 11/17/10  . Lactose intolerance   . Periodic limb movement disorder (PLMD)     Past Surgical History   Procedure Laterality Date  . Tonsillectomy    . Atrial fibrillation ablation  11/17/10    PVI by Dr Randy Cantu    Current Outpatient Prescriptions  Medication Sig Dispense Refill  . diclofenac (CATAFLAM) 50 MG tablet as needed.      . fexofenadine (ALLEGRA) 180 MG tablet Take 90 mg by mouth daily.        . flecainide (TAMBOCOR) 150 MG tablet One po bid  180 tablet  0  . pravastatin (PRAVACHOL) 40 MG tablet Take 1 tablet (40 mg total) by mouth daily.  90 tablet  3  . verapamil (VERELAN PM) 120 MG 24 hr capsule Take 1 capsule (120 mg total) by mouth at bedtime.  90 capsule  3  . clonazePAM (KLONOPIN) 0.5 MG tablet       . zoster vaccine live, PF, (ZOSTAVAX) 16109 UNT/0.65ML injection Inject 19,400 Units into the skin once.  1 each  0   No current facility-administered medications for this visit.    Allergies as of 10/17/2013 - Review Complete 10/17/2013  Allergen Reaction Noted  . Pantoprazole sodium  01/04/2011    Vitals: BP 128/80  Pulse 57  Resp 16  Ht 6' 0.5" (1.842 m)  Wt 223 lb (101.152 kg)  BMI 29.81 kg/m2 Last Weight:  Wt Readings from Last 1 Encounters:  10/17/13 223 lb (101.152 kg)   Last Height:   Ht Readings from Last 1 Encounters:  10/17/13 6' 0.5" (1.842 m)    Physical exam:  General: The patient is awake, alert and appears not in acute distress. The patient is well groomed. Head: Normocephalic, atraumatic. Neck is supple. Mallampati 2, neck circumference:17.25 .  Cardiovascular:  Regular rate and rhythm , without  murmurs or carotid bruit, and without distended neck veins. Respiratory: Lungs are clear to auscultation. Skin:  Without evidence of edema, or rash Trunk: BMI is elevated, patient  has normal posture.  Neurologic exam : The patient is awake and alert, oriented to place and time.  Memory subjective  described as intact. There is a normal attention span & concentration ability. Speech is fluent without  dysarthria, dysphonia or aphasia.  Mood and  affect are appropriate.  Cranial nerves: Pupils are equal and briskly reactive to light. Funduscopic exam without  evidence of pallor or edema.  Extraocular movements  in vertical and horizontal planes intact and without nystagmus. Visual fields by finger perimetry are intact. Hearing to finger rub intact.  Facial sensation intact to fine touch. Facial motor strength is symmetric and tongue and uvula move midline.  Motor exam:   Normal tone and normal muscle bulk and symmetric normal strength in all extremities. No cog -wheeling .   Sensory:  Fine touch, pinprick and vibration were tested in all extremities. Proprioception is  normal.  Coordination: Rapid alternating movements in the fingers/hands is tested and normal. Finger-to-nose maneuver  without evidence of ataxia, dysmetria or tremor.  Gait and station: Patient walks without assistive device , climbs up to the exam table.  Strength within normal limits. Stance is stable and normal. Tandem gait is unfragmented. Romberg testing is normal.  Deep tendon reflexes: in the  upper and lower extremities are symmetric and intact. Babinski maneuver  downgoing.   Assessment:  After physical and neurologic examination, review of laboratory studies, imaging, neurophysiology testing and pre-existing records, assessment is  1) REM BD - restart klonipin.  2) PLM disorder with myoclonus. No RLS .   Plan:  Treatment plan and additional workup :Klonipin, yearly screening visit for PD.

## 2013-10-17 NOTE — Telephone Encounter (Signed)
Rx faxed over to [facility] at [phone].

## 2013-12-17 ENCOUNTER — Encounter: Payer: Self-pay | Admitting: Cardiology

## 2013-12-17 ENCOUNTER — Ambulatory Visit (INDEPENDENT_AMBULATORY_CARE_PROVIDER_SITE_OTHER): Payer: BC Managed Care – PPO | Admitting: Cardiology

## 2013-12-17 VITALS — BP 110/78 | HR 57 | Ht 72.5 in | Wt 220.1 lb

## 2013-12-17 DIAGNOSIS — E785 Hyperlipidemia, unspecified: Secondary | ICD-10-CM

## 2013-12-17 DIAGNOSIS — I4891 Unspecified atrial fibrillation: Secondary | ICD-10-CM

## 2013-12-17 DIAGNOSIS — I480 Paroxysmal atrial fibrillation: Secondary | ICD-10-CM

## 2013-12-17 MED ORDER — FLECAINIDE ACETATE 150 MG PO TABS: ORAL_TABLET | ORAL | Status: DC

## 2013-12-17 MED ORDER — PRAVASTATIN SODIUM 40 MG PO TABS: 40.0000 mg | ORAL_TABLET | Freq: Every day | ORAL | Status: DC

## 2013-12-17 MED ORDER — VERAPAMIL HCL ER 120 MG PO CP24: 120.0000 mg | ORAL_CAPSULE | Freq: Every day | ORAL | Status: DC

## 2013-12-17 NOTE — Patient Instructions (Signed)
Your physician wants you to follow-up in: 1 YEAR WITH DR. ALLRED. You will receive a reminder letter in the mail two months in advance. If you don't receive a letter, please call our office to schedule the follow-up appointment.   Your physician recommends that you continue on your current medications as directed. Please refer to the Current Medication list given to you today.  

## 2013-12-17 NOTE — Progress Notes (Signed)
Patient ID: Randy Cantu MRN: [id number], DOB/AGE: 62-28-1952   Date of Visit: 12/17/2013  Primary Physician: Unice Cobble, MD Primary Cardiologist: Thompson Grayer, MD Reason for Visit: EP follow-up for atrial fibrillation  History of Present Illness  Randy Cantu is a 63 y.o. male with paroxysmal atrial fibrillation who presents today for routine electrophysiology followup. Since last being seen in our clinic, he reports he is doing well and has no complaints. He has rare palpitations which he tells me have been stable, not worsening in frequency or duration. He denies chest pain or shortness of breath. He denies dizziness, near syncope or syncope. He denies LE swelling, orthopnea or PND. He is compliant and tolerating medications without difficulty.  Past Medical History Past Medical History  Diagnosis Date  . Allergic rhinitis   . HLD (hyperlipidemia)   . PAF (paroxysmal atrial fibrillation)     s/p afib ablation 11/17/10  . Lactose intolerance   . Periodic limb movement disorder (PLMD)     Past Surgical History Past Surgical History  Procedure Laterality Date  . Tonsillectomy    . Atrial fibrillation ablation  11/17/10    PVI by Dr Rayann Heman    Allergies/Intolerances Allergies  Allergen Reactions  . Pantoprazole Sodium     REACTION: rash from this or from  topical disinfectant    Current Home Medications Current Outpatient Prescriptions  Medication Sig Dispense Refill  . clonazePAM (KLONOPIN) 0.5 MG tablet Take 0.5 tablets (0.25 mg total) by mouth at bedtime.  90 tablet  1  . diclofenac (CATAFLAM) 50 MG tablet as needed.      . fexofenadine (ALLEGRA) 180 MG tablet Take 90 mg by mouth daily.        . flecainide (TAMBOCOR) 150 MG tablet One po bid  180 tablet  0  . pravastatin (PRAVACHOL) 40 MG tablet Take 1 tablet (40 mg total) by mouth daily.  90 tablet  3  . verapamil (VERELAN PM) 120 MG 24 hr capsule Take 1 capsule (120 mg total) by mouth at bedtime.  90 capsule   3  . zoster vaccine live, PF, (ZOSTAVAX) 95621 UNT/0.65ML injection Inject 19,400 Units into the skin once.  1 each  0   No current facility-administered medications for this visit.    Social History History   Social History  . Marital Status: Married    Spouse Name: N/A    Number of Children: 2  . Years of Education: N/A   Occupational History  . DOCTOR/EYE    Social History Main Topics  . Smoking status: Never Smoker   . Smokeless tobacco: Never Used  . Alcohol Use: 4.2 oz/week    7 Glasses of wine per week  . Drug Use: No  . Sexual Activity: Not on file   Other Topics Concern  . 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  Physical Exam Vitals: Blood pressure 110/78, pulse 57, height 6' 0.5" (1.842 m), weight 220 lb 1.9 oz (99.846 kg).  General: Well developed, well appearing 63 y.o. male in no acute distress. HEENT: Normocephalic, atraumatic. EOMs intact. Sclera nonicteric. Oropharynx clear.  Neck: Supple. No JVD. Lungs: Respirations regular and unlabored, CTA bilaterally. No wheezes, rales or rhonchi. Heart: RRR. S1, S2 present. No murmurs, rub, S3 or S4. Abdomen: Soft, non-distended.  Extremities: No clubbing, cyanosis or edema. PT/Radials 2+ and equal bilaterally. Psych: Normal affect. Neuro: Alert and oriented X 3. Moves all extremities spontaneously.   Diagnostics Flecainide level  Jan 2014 - 0.5 12-lead ECG today - sinus bradycardia at 57 bpm with nonspecific T wave abnormality; PR 212, QRS 112, QTc 441; unchanged from previous ECGs (QRS duration range 106-114 msec since 2012)  Assessment and Plan  1. Paroxysmal atrial fibrillation - stable, well controlled on flecainide - check flecainide level today and if remains in therapeutic range will continue current dose - CHADS-VASc score is 0 - return for follow-up with Dr. Rayann Heman in one year  2. Dyslipidemia - previously followed by Dr. Linna Darner who is now retired; last lipid panel and LFTs were done Oct 2013 - check fasting lipid panel and LFTs - continue pravastatin  Signed, Aarav Burgett, PA-C 12/17/2013, 1:46 PM

## 2013-12-19 ENCOUNTER — Other Ambulatory Visit: Payer: BC Managed Care – PPO

## 2014-01-17 ENCOUNTER — Other Ambulatory Visit: Payer: Self-pay | Admitting: Internal Medicine

## 2014-02-02 DIAGNOSIS — N529 Male erectile dysfunction, unspecified: Secondary | ICD-10-CM | POA: Insufficient documentation

## 2014-02-02 DIAGNOSIS — Z8042 Family history of malignant neoplasm of prostate: Secondary | ICD-10-CM | POA: Insufficient documentation

## 2014-02-02 DIAGNOSIS — N400 Enlarged prostate without lower urinary tract symptoms: Secondary | ICD-10-CM | POA: Insufficient documentation

## 2014-03-25 ENCOUNTER — Encounter: Payer: Self-pay | Admitting: Internal Medicine

## 2014-05-12 ENCOUNTER — Other Ambulatory Visit: Payer: Self-pay | Admitting: Internal Medicine

## 2014-05-26 ENCOUNTER — Other Ambulatory Visit: Payer: Self-pay | Admitting: Neurology

## 2014-05-26 NOTE — Telephone Encounter (Signed)
Rx signed and faxed.

## 2014-05-28 ENCOUNTER — Ambulatory Visit (INDEPENDENT_AMBULATORY_CARE_PROVIDER_SITE_OTHER): Payer: BC Managed Care – PPO | Admitting: Internal Medicine

## 2014-05-28 ENCOUNTER — Encounter: Payer: Self-pay | Admitting: Internal Medicine

## 2014-05-28 ENCOUNTER — Other Ambulatory Visit: Payer: BC Managed Care – PPO

## 2014-05-28 VITALS — BP 120/80 | HR 60 | Temp 98.3°F | Resp 12 | Wt 220.0 lb

## 2014-05-28 DIAGNOSIS — N5089 Other specified disorders of the male genital organs: Secondary | ICD-10-CM

## 2014-05-28 DIAGNOSIS — N451 Epididymitis: Secondary | ICD-10-CM

## 2014-05-28 DIAGNOSIS — N508 Other specified disorders of male genital organs: Secondary | ICD-10-CM

## 2014-05-28 DIAGNOSIS — N453 Epididymo-orchitis: Secondary | ICD-10-CM

## 2014-05-28 DIAGNOSIS — R82998 Other abnormal findings in urine: Secondary | ICD-10-CM

## 2014-05-28 DIAGNOSIS — R8290 Unspecified abnormal findings in urine: Secondary | ICD-10-CM

## 2014-05-28 LAB — POCT URINALYSIS DIPSTICK
Bilirubin, UA: NEGATIVE
Glucose, UA: NEGATIVE
Ketones, UA: NEGATIVE
LEUKOCYTES UA: NEGATIVE
NITRITE UA: NEGATIVE
PH UA: 6.5
PROTEIN UA: NEGATIVE
Spec Grav, UA: <=1.005
Urobilinogen, UA: 0.2

## 2014-05-28 MED ORDER — CIPROFLOXACIN HCL 500 MG PO TABS
500.0000 mg | ORAL_TABLET | Freq: Two times a day (BID) | ORAL | Status: DC
Start: 2014-05-28 — End: 2014-12-09

## 2014-05-28 MED ORDER — TRAMADOL HCL 50 MG PO TABS: 50.0000 mg | ORAL_TABLET | Freq: Four times a day (QID) | ORAL | Status: DC | PRN

## 2014-05-28 NOTE — Patient Instructions (Addendum)
Drink as much nondairy fluids as possible. Avoid spicy foods or alcohol as  these may aggravate the condition. Do not take decongestants. Avoid narcotics if possible.  Fill tub with hot water and soak in it twice a day to help relieve the soft tissue pain.   [place] of scrotum if no better over 5 days.

## 2014-05-28 NOTE — Progress Notes (Signed)
Pre visit review using our clinic review tool, if applicable. No additional management support is needed unless otherwise documented below in the visit note. 

## 2014-05-28 NOTE — Progress Notes (Signed)
   Subjective:    Patient ID: Randy Cantu, male    DOB: 04/15/1951, 63 y.o.   MRN: [id number]  HPI  On 7/20 he noted a tender mass behind the left testicle. He's had this in the past but it's been only temporary in duration  As of 7/21 the testicle was sore but the mass was smaller.  7/22 there was increased tenderness; he could not appreciate a mass at that time. 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  As of today the lesion is 30-50% less painful.  He receives ED drug injections from a urologist. His past history of vasectomy    Review of Systems He denies fever, chills or sweats No associated  rash or dermatitis. No pyuria, dysuria, hematuria. There's also no urgency, hesitancy, dribbling.  He has a history of migraines. He has been told not to take excessive nonsteroidals because of possible conversion of migraine to a daily headache.     Objective:   Physical Exam General appearance is one of good health and nourishment w/o distress. Eyes: No conjunctival inflammation or scleral icterus is present. Oral exam: Dental hygiene is good; lips and gums are healthy appearing.There is no oropharyngeal erythema or exudate noted.  Heart:  Normal rate and regular rhythm. S1 and S2 normal without gallop, murmur, click, rub or other extra sounds   Lungs:Chest clear to auscultation; no wheezes, rhonchi,rales ,or rubs present.No increased work of breathing.  Abdomen: bowel sounds normal, soft and non-tender without masses, organomegaly or hernias noted.  No guarding or rebound . No tenderness over the flanks to percussion Musculoskeletal: Able to lie flat and sit up without help. Negative straight leg raising bilaterally. Gait normal Skin:Warm & dry.  Intact without suspicious lesions or rashes ; no jaundice or tenting Lymphatic: No lymphadenopathy is noted about the head, neck, axilla, or inguinal areas.  There is a 2.5 x 1 cm tender mass posterolaterally to the left  testicle.        Assessment & Plan:  #1 epididymitis #2 ? Hydrocele See orders & AVS

## 2014-05-29 LAB — URINE CULTURE
Colony Count: NO GROWTH
Organism ID, Bacteria: NO GROWTH

## 2014-05-31 NOTE — Progress Notes (Signed)
   Subjective:    Patient ID: Randy Cantu, male    DOB: February 09, 1951, 63 y.o.   MRN: [id number]  HPI    Review of Systems     Objective:   Physical Exam        Assessment & Plan:  Note: 05/21/2012 [facility] note reviewed ; no L scrotal mass. Plan: [place] if no better 06/01/14 on Cipro.

## 2014-06-02 ENCOUNTER — Encounter: Payer: Self-pay | Admitting: Internal Medicine

## 2014-09-09 ENCOUNTER — Telehealth: Payer: Self-pay | Admitting: Internal Medicine

## 2014-09-09 NOTE — Telephone Encounter (Signed)
Patient is requesting shingles vac

## 2014-09-09 NOTE — Telephone Encounter (Signed)
OK 

## 2014-09-09 NOTE — Telephone Encounter (Signed)
Left patient vm to call back to schedule.  °

## 2014-09-11 ENCOUNTER — Telehealth: Payer: Self-pay | Admitting: Neurology

## 2014-09-11 NOTE — Telephone Encounter (Signed)
Left message for patient regarding rescheduling 10/22/14 appointment per DR. Dohmeier's schedule, left message with new appointment date and time.

## 2014-10-22 ENCOUNTER — Ambulatory Visit (INDEPENDENT_AMBULATORY_CARE_PROVIDER_SITE_OTHER): Payer: BC Managed Care – PPO | Admitting: Podiatry

## 2014-10-22 ENCOUNTER — Ambulatory Visit (INDEPENDENT_AMBULATORY_CARE_PROVIDER_SITE_OTHER): Payer: BC Managed Care – PPO

## 2014-10-22 ENCOUNTER — Ambulatory Visit: Payer: BC Managed Care – PPO | Admitting: Neurology

## 2014-10-22 ENCOUNTER — Ambulatory Visit: Payer: BC Managed Care – PPO | Admitting: Podiatry

## 2014-10-22 VITALS — BP 139/80 | HR 53

## 2014-10-22 DIAGNOSIS — M722 Plantar fascial fibromatosis: Secondary | ICD-10-CM

## 2014-10-22 DIAGNOSIS — M205X1 Other deformities of toe(s) (acquired), right foot: Secondary | ICD-10-CM

## 2014-10-22 DIAGNOSIS — M779 Enthesopathy, unspecified: Secondary | ICD-10-CM

## 2014-10-22 DIAGNOSIS — M79673 Pain in unspecified foot: Secondary | ICD-10-CM

## 2014-10-22 MED ORDER — TRIAMCINOLONE ACETONIDE 10 MG/ML IJ SUSP
10.0000 mg | Freq: Once | INTRAMUSCULAR | Status: AC
  Administered 2014-10-22: 10 mg

## 2014-10-23 ENCOUNTER — Ambulatory Visit: Payer: BC Managed Care – PPO | Admitting: Neurology

## 2014-10-25 NOTE — Progress Notes (Signed)
Subjective:     Patient ID: Randy Cantu, male   DOB: 1951/08/06, 63 y.o.   MRN: [id number]  HPI patient presents with inflammation and pain second metatarsophalangeal joint right foot and also complains of pain in the big toe joint right with reduced motion. States that this is been present for a while   Review of Systems     Objective:   Physical Exam Neurovascular status intact with muscle strength adequate range of motion within normal limits. Patient is noted to have fluid buildup second MPJ right and is also noted to have diminished range of motion first MPJ with crepitus upon dorsiflexion and pain of a moderate nature within the joint surface    Assessment:     Inflammatory capsulitis and hallux limitus rigidus with moderate structural changes    Plan:     H&P and x-rays reviewed. Did a proximal block of the right second MPJ aspirated the joint I was able to get out a small amount of fluid and injected with half cc dexamethasone Kenalog and applied pad to reduce pressure. Discussed ultimately that the MPJ first may need to be corrected was spur removal but we will watch that and see how it progresses

## 2014-10-27 ENCOUNTER — Telehealth: Payer: Self-pay

## 2014-10-27 NOTE — Telephone Encounter (Signed)
[facility] contacted [place] saying although the patient's Klonopin Rx is written for one half tab nightly, patient has decided to take one full tab nightly instead.  He would like a new Rx written for this dose.  Patient has an appt scheduled 01/06.  Dr Brett Fairy is out of the office, forwarding request to [facility] for review.  Please advise.  Thank you.

## 2014-10-27 NOTE — Telephone Encounter (Signed)
I called the pharmacy.  Spoke with Angie.  Relayed WID message.  They will process Rx and call us back if anything further is needed.

## 2014-10-27 NOTE — Telephone Encounter (Signed)
Yes, ok to increase for now and then follow up with Dr. Keturah Barre. -VRP

## 2014-10-28 ENCOUNTER — Telehealth: Payer: Self-pay | Admitting: Neurology

## 2014-10-28 NOTE — Telephone Encounter (Signed)
I called back.  Got no answer.  Left message.  

## 2014-10-28 NOTE — Telephone Encounter (Signed)
Patient requesting additional pills for Rx clonazePAM (KLONOPIN) 0.5 MG tablet due to appointment from 1/6 has been rescheduled to 2/3.  Please call and advise.

## 2014-11-02 ENCOUNTER — Ambulatory Visit: Payer: BC Managed Care – PPO | Admitting: Neurology

## 2014-11-02 ENCOUNTER — Telehealth: Payer: Self-pay

## 2014-11-02 NOTE — Telephone Encounter (Signed)
Patient is requesting a new Rx for Clonazepam 0.5mg  tabs one full tab at bedtime instead of one half tablet at bedtime as previously prescribed to last until appt.  WID authorized change temporarily until seen, however, appt has been rescheduled, so he is requesting additional refill.  Thank you.

## 2014-11-03 MED ORDER — CLONAZEPAM 0.5 MG PO TABS: 0.5000 mg | ORAL_TABLET | Freq: Every day | ORAL | Status: DC

## 2014-11-03 NOTE — Telephone Encounter (Signed)
Done, CD

## 2014-11-04 ENCOUNTER — Encounter: Payer: Self-pay | Admitting: Podiatry

## 2014-11-04 ENCOUNTER — Ambulatory Visit (INDEPENDENT_AMBULATORY_CARE_PROVIDER_SITE_OTHER): Payer: BC Managed Care – PPO | Admitting: Podiatry

## 2014-11-04 ENCOUNTER — Ambulatory Visit: Payer: BC Managed Care – PPO | Admitting: Podiatry

## 2014-11-04 VITALS — BP 148/81 | HR 55 | Resp 16

## 2014-11-04 DIAGNOSIS — M722 Plantar fascial fibromatosis: Secondary | ICD-10-CM

## 2014-11-04 DIAGNOSIS — M205X1 Other deformities of toe(s) (acquired), right foot: Secondary | ICD-10-CM

## 2014-11-04 DIAGNOSIS — M779 Enthesopathy, unspecified: Secondary | ICD-10-CM

## 2014-11-04 MED ORDER — TRIAMCINOLONE ACETONIDE 10 MG/ML IJ SUSP
10.0000 mg | Freq: Once | INTRAMUSCULAR | Status: AC
  Administered 2014-11-04: 10 mg

## 2014-11-04 NOTE — Progress Notes (Signed)
Subjective:     Patient ID: Randy Cantu, male   DOB: Oct 09, 1951, 63 y.o.   MRN: [id number]  HPI patient points around the right foot and states still getting some pain in the joint and around the third interspace and also some occasional discomfort around my big toe joint which I'm not sure if it could be part of the problem on experiencing   Review of Systems  All other systems reviewed and are negative.      Objective:   Physical Exam Neurovascular status intact with muscle strength adequate and range of motion subtalar midtarsal joint within normal limits. Continued mild discomfort third MPJ right and slightly into the third interspace and fourth MPJ right with into reviewed reduction of motion first MPJ right and crepitus within the joint surface    Assessment:     Inflammatory capsulitis possible right third and fourth MPJ with possibility also for neuroma symptoms and also possibility for hallux limitus inflammatory changes    Plan:     Reviewed all conditions and today went ahead and injected around the third and fourth MPJ 3 mg Kenalog, grams Xylocaine and dispensed a graphite insole and scanned for custom orthotics to reduce stress against the feet. Reappoint when orthotics are ready or earlier if needed

## 2014-11-11 ENCOUNTER — Ambulatory Visit: Payer: BC Managed Care – PPO | Admitting: Neurology

## 2014-12-02 ENCOUNTER — Encounter: Payer: Self-pay | Admitting: Podiatry

## 2014-12-02 ENCOUNTER — Ambulatory Visit (INDEPENDENT_AMBULATORY_CARE_PROVIDER_SITE_OTHER): Payer: BLUE CROSS/BLUE SHIELD | Admitting: Podiatry

## 2014-12-02 DIAGNOSIS — M779 Enthesopathy, unspecified: Secondary | ICD-10-CM

## 2014-12-02 NOTE — Patient Instructions (Signed)

## 2014-12-03 NOTE — Progress Notes (Signed)
Subjective:     Patient ID: Randy Cantu, male   DOB: February 24, 1951, 64 y.o.   MRN: [id number]  HPI patient presents stating the pads and injections of help me quite a bit with discomfort still noted and I'm excited for orthotics   Review of Systems     Objective:   Physical Exam Neurovascular status intact with mild discomfort when the forefoot right is palpated with diminishment of discomfort and patient able to walk more efficiently    Assessment:     Improving plantar capsulitis    Plan:     Dispensed orthotics with instructions on usage and also dispensed pads to use when exercising. Discussed second pair of orthotics for dress shoes which we may make next visit depending on response to the initial pair. Reappoint 6 weeks to recheck

## 2014-12-09 ENCOUNTER — Encounter: Payer: Self-pay | Admitting: Neurology

## 2014-12-09 ENCOUNTER — Ambulatory Visit (INDEPENDENT_AMBULATORY_CARE_PROVIDER_SITE_OTHER): Payer: BLUE CROSS/BLUE SHIELD | Admitting: Neurology

## 2014-12-09 VITALS — BP 124/76 | HR 50 | Resp 16 | Ht 73.0 in | Wt 227.5 lb

## 2014-12-09 DIAGNOSIS — G478 Other sleep disorders: Secondary | ICD-10-CM | POA: Insufficient documentation

## 2014-12-09 DIAGNOSIS — G4752 REM sleep behavior disorder: Secondary | ICD-10-CM | POA: Insufficient documentation

## 2014-12-09 MED ORDER — CLONAZEPAM 0.5 MG PO TABS: 0.5000 mg | ORAL_TABLET | Freq: Every day | ORAL | Status: DC

## 2014-12-09 NOTE — Progress Notes (Signed)
[facility]  Provider:  Larey Seat, M D  Referring Provider: Hendricks Limes, MD Primary Care Physician:  Unice Cobble, MD  Chief Complaint  Patient presents with  . RV sleep    Rm 10, alone    HPI: Randy Cantu is a 64 y.o., caucasian , married,  male . He  is seen here as a  revisit  from Dr. Linna Darner for persistent daytime sleepiness in the absence of apnea.  The patient was evaluated in Dec 2013 for EDS, underwent a PSG on 10-17-12 - but had only frequent PLMs , not associated with a significant arousal index. His  AHI was low, (1.7) .  Dr. Sabra Heck endorsed  the Epworth sleepiness score at 16 points during our visit on 11-01-12. He had also reported diplopia intermittent and it was concerned that this may be related to his treatment with flecainide also known as tumble core for his atrial fibrillation. His wife noted that he has been numb still acting out dreams behavior, never leaving the bed. He had been prescribed Klonopin which he felt that in all negative or positive effect he may have slept with a deeper or more restorative quality. Unfortunately, his wife felt that he had personality changes and became more of a bruit or impulsive in response to taking the medication. Therefore he discontinued Klonipin. These REM behaviors have still continued, at times he may be a sleep talking sometimes he will cry out or holler loudly . He never leaves his bed , he sometimes thrashing about , often  talking. He seems to produce these behaviors at 3-5 AM, not within the first 90 minutes of sleep.  This seems to be separate form : characterized as jerking upper movements, his wife has used the term myoclonus, twitching and jerking. The is occurs earlier with the first onset of sleep .   Interval history 12-09-14; Dr. Sabra Heck is seen here today in a regular follow-up he does have REM behavior disorder which is well controlled on Klonopin. He restarted Klonopin at 0.5 mg  total at bedtime. The patient also suffers from atrial fibrillation, has not woken up with palpitations shortness of breath or any panic or breathing difficulties. His weight control was achieved with verapamil and flecainide. Headaches occur less than once a month and are really not in that timeframe or occurrence to use a preventive medication. He has not been acting out dreams or been sleep talking or loudly snoring at all since his last visit here. Today is a re-visit with routine refills.   Review of Systems: Out of a complete 14 system review, the patient complains of only the following symptoms, and all other reviewed systems are negative.  He has repeatedly denied having RLS.  No nocturia, he may sometimes more or have coughing at night but  is not hypoxic,  no Palpitations,  he does not wake up with an anxiety or panic spell. There have been no reports of this autonomic spells, fainting or diaphoresis at night. No palpitation from atrial  fib as long as on fleccanide and verapmil.  The diplopia has been intermittent- rare -  very rare,  and is not related to the fatigue,  not correlated with ptosis, nor  dilated pupil. No diplopia since last visit.   There has been no associated swallowing difficulty, no slurred speech and no respiratory distress. Vertical diplopia occurred about 4 times in the last 36 month, more than 12 month ago last time.  Epworth 14, FSS 18 points.    History   Social History  . Marital Status: Married    Spouse Name: N/A    Number of Children: 2  . Years of Education: N/A   Occupational History  . DOCTOR/EYE    Social History Main Topics  . Smoking status: Never Smoker   . Smokeless tobacco: Never Used  . Alcohol Use: 4.2 oz/week    7 Glasses of wine per week  . Drug Use: No  . Sexual Activity: Not on file   Other Topics Concern  . 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    Family History  Problem Relation Age of Onset  . Cancer    . Peripheral vascular disease Mother     afib in her 37s  . Aplastic anemia Father   . Prostate cancer Maternal Grandfather   . Leukemia Paternal Grandfather   . Colon cancer Neg Hx     Past Medical History  Diagnosis Date  . Allergic rhinitis   . HLD (hyperlipidemia)   . PAF (paroxysmal atrial fibrillation)     s/p afib ablation 11/17/10  . Lactose intolerance   . Periodic limb movement disorder (PLMD)   . Right foot pain     all of foot 1--/2016    Past Surgical History  Procedure Laterality Date  . Tonsillectomy    . Atrial fibrillation ablation  11/17/10    PVI by Dr Rayann Heman    Current Outpatient Prescriptions  Medication Sig Dispense Refill  . clonazePAM (KLONOPIN) 0.5 MG tablet Take 1 tablet (0.5 mg total) by mouth at bedtime. 90 tablet 0  . diclofenac (CATAFLAM) 50 MG tablet as needed.    . flecainide (TAMBOCOR) 150 MG tablet One po bid 180 tablet 3  . pravastatin (PRAVACHOL) 40 MG tablet Take 1 tablet (40 mg total) by mouth daily. 90 tablet 3  . verapamil (VERELAN PM) 120 MG 24 hr capsule Take 1 capsule (120 mg total) by mouth daily. 90 capsule 3  . fexofenadine (ALLEGRA) 180 MG tablet Take 90 mg by mouth daily.      Marland Kitchen zoster vaccine live, PF, (ZOSTAVAX) 24401 UNT/0.65ML injection Inject 19,400 Units into the skin once. 1 each 0   No current facility-administered medications for this visit.    Allergies as of 12/09/2014 - Review Complete 12/09/2014  Allergen Reaction Noted  . Pantoprazole sodium  01/04/2011    Vitals: BP 124/76 mmHg  Pulse 50  Resp 16  Ht 6\' 1"  (1.854 m)  Wt 227 lb 8 oz (103.193 kg)  BMI 30.02 kg/m2 Last Weight:  Wt Readings from Last 1 Encounters:  12/09/14 227 lb 8 oz (103.193 kg)   Last Height:   Ht Readings from Last 1 Encounters:  12/09/14 6\' 1"  (1.854 m)    Physical exam:  General: The patient is awake, alert and appears not in acute distress. The  patient is well groomed. Head: Normocephalic, atraumatic. Neck is supple. Mallampati 2, neck circumference:17 inches  .  Cardiovascular:  Regular rate and rhythm, without  murmurs or carotid bruit, and without distended neck veins. Respiratory: Lungs are clear to auscultation. Skin:  Without evidence of edema, or rash Trunk: has normal posture.  Neurologic exam : The patient is awake and alert, oriented to place and time. Memory subjective  described as intact.  There is a normal attention span & concentration  ability. Speech is fluent without  dysarthria, dysphonia or aphasia.  Mood and affect are appropriate.  Cranial nerves: Pupils are equal and briskly reactive to light. Funduscopic exam without  evidence of pallor .  Extraocular movements  in vertical and horizontal planes intact and without nystagmus.   Hearing to finger rub intact.  Facial sensation intact to fine touch. Facial motor strength is symmetric and tongue and uvula move midline.  Motor exam:   Normal tone and normal muscle bulk and symmetric normal strength in all extremities. No cog -wheeling .   Sensory:  Fine touch, pinprick and vibration were tested in all extremities. Proprioception is normal.  Coordination: Rapid alternating movements in the fingers/hands is tested and normal. Finger-to-nose maneuver  without evidence of ataxia, dysmetria or tremor.  Gait and station: Patient walks without assistive device , climbs up to the exam table.  Strength within normal limits. Stance is stable and normal. Tandem gait is unfragmented. Romberg testing is normal.  Deep tendon reflexes: in the  upper and lower extremities are symmetric and intact. Babinski maneuver  downgoing.   Assessment:  After physical and neurologic examination, review of laboratory studies, imaging, neurophysiology testing and pre-existing records, assessment is  1) REM BD - restarted  klonipin. Well controlled on 0.5 mg nightly. 2) PLM disorder with  myoclonus. No RLS.    Plan:  Treatment plan and additional workup :Klonipin refilled , yearly screening visit for PD. Dr Linna Darner got CC.

## 2014-12-09 NOTE — Addendum Note (Signed)
Addended by: Larey Seat on: 12/09/2014 12:37 PM   Modules accepted: Orders

## 2014-12-10 ENCOUNTER — Telehealth: Payer: Self-pay

## 2014-12-10 NOTE — Telephone Encounter (Signed)
Phone call to Dr Sabra Heck, he states he will call back to schedule a physical appointment

## 2014-12-10 NOTE — Telephone Encounter (Signed)
-----   Message from Hendricks Limes, MD sent at 12/10/2014  5:37 AM EST ----- Please ask Dr Sabra Heck if he could make follow up appointment to recheck issue for which seen jn 7/15.Thnx, Hopp

## 2015-01-01 ENCOUNTER — Other Ambulatory Visit: Payer: Self-pay

## 2015-01-01 ENCOUNTER — Other Ambulatory Visit: Payer: Self-pay | Admitting: *Deleted

## 2015-01-01 MED ORDER — VERAPAMIL HCL ER 120 MG PO CP24: 120.0000 mg | ORAL_CAPSULE | Freq: Every day | ORAL | Status: DC

## 2015-01-01 MED ORDER — PRAVASTATIN SODIUM 40 MG PO TABS: 40.0000 mg | ORAL_TABLET | Freq: Every day | ORAL | Status: DC

## 2015-01-06 ENCOUNTER — Other Ambulatory Visit: Payer: Self-pay | Admitting: *Deleted

## 2015-01-06 MED ORDER — VERAPAMIL HCL ER 120 MG PO CP24: 120.0000 mg | ORAL_CAPSULE | Freq: Every day | ORAL | Status: DC

## 2015-01-06 MED ORDER — PRAVASTATIN SODIUM 40 MG PO TABS: 40.0000 mg | ORAL_TABLET | Freq: Every day | ORAL | Status: DC

## 2015-01-20 ENCOUNTER — Ambulatory Visit: Payer: BLUE CROSS/BLUE SHIELD

## 2015-01-20 ENCOUNTER — Ambulatory Visit (INDEPENDENT_AMBULATORY_CARE_PROVIDER_SITE_OTHER): Payer: BLUE CROSS/BLUE SHIELD | Admitting: Podiatry

## 2015-01-20 ENCOUNTER — Encounter: Payer: Self-pay | Admitting: Podiatry

## 2015-01-20 VITALS — BP 143/74 | HR 74 | Resp 16

## 2015-01-20 DIAGNOSIS — M205X1 Other deformities of toe(s) (acquired), right foot: Secondary | ICD-10-CM

## 2015-01-20 DIAGNOSIS — G5761 Lesion of plantar nerve, right lower limb: Secondary | ICD-10-CM | POA: Diagnosis not present

## 2015-01-20 DIAGNOSIS — D3610 Benign neoplasm of peripheral nerves and autonomic nervous system, unspecified: Secondary | ICD-10-CM

## 2015-01-20 DIAGNOSIS — M79672 Pain in left foot: Secondary | ICD-10-CM | POA: Diagnosis not present

## 2015-01-20 MED ORDER — PREDNISONE 10 MG PO TABS: ORAL_TABLET | ORAL | Status: DC

## 2015-01-21 NOTE — Progress Notes (Signed)
Subjective:     Patient ID: Randy Cantu, male   DOB: 03/16/1951, 64 y.o.   MRN: [id number]  HPI patient states I been having pain in both my feet with the right still being worse but the left one really starting to bother me and it seems like it's between the third and fourth toes where it has settled. The orthotics have helped me some but I continue to have trouble   Review of Systems     Objective:   Physical Exam Neurovascular status intact with muscle strength adequate and noted to have discomfort with radiating discomfort between the third and fourth toes of both feet with shooting pains into the adjacent digits and I was unable to get a positive Biagio Borg sign but I did not note pain in the metatarsal phalangeal joints currently    Assessment:     Probable neuroma symptomatology right over left versus capsulitis versus other unknown systemic condition.    Plan:     Spent a great of time reviewing condition and today I did go ahead and I injected the nerve root right with a purified alcohol solution and Marcaine and explained the utilization of testing techniques. Also placed on sterile prep DS 12 day Dosepak that I want him to start to see whether we can reduce any inflammatory component to his condition reappoint in 2 weeks

## 2015-02-03 ENCOUNTER — Ambulatory Visit (INDEPENDENT_AMBULATORY_CARE_PROVIDER_SITE_OTHER): Payer: BLUE CROSS/BLUE SHIELD | Admitting: Podiatry

## 2015-02-03 VITALS — BP 132/73 | HR 67 | Resp 16

## 2015-02-03 DIAGNOSIS — M779 Enthesopathy, unspecified: Secondary | ICD-10-CM | POA: Diagnosis not present

## 2015-02-03 DIAGNOSIS — M205X1 Other deformities of toe(s) (acquired), right foot: Secondary | ICD-10-CM

## 2015-02-03 MED ORDER — MELOXICAM 15 MG PO TABS: 15.0000 mg | ORAL_TABLET | Freq: Every day | ORAL | Status: DC

## 2015-02-03 MED ORDER — TRIAMCINOLONE ACETONIDE 10 MG/ML IJ SUSP
10.0000 mg | Freq: Once | INTRAMUSCULAR | Status: AC
  Administered 2015-02-03: 10 mg

## 2015-02-03 MED ORDER — PREDNISONE 10 MG PO TABS: ORAL_TABLET | ORAL | Status: DC

## 2015-02-03 NOTE — Progress Notes (Signed)
Subjective:     Patient ID: Randy Cantu, male   DOB: 04-28-1951, 65 y.o.   MRN: [id number]  HPI patient states that he still is having pain in his forefoot but that the steroid definitely helped him a lot   Review of Systems     Objective:   Physical Exam Neurovascular status intact muscle strength adequate with discomfort in the third metatarsophalangeal joint right of a moderate nature and all remaining inflammation improved. Did not appear to have response to the injection treatment of last visit    Assessment:     Probable capsulitis third MPJ right with possible unknown inflammatory condition    Plan:     We are going to make him a second pair of orthotics to try to reduce stress against his feet for different types of shoes and I did do a proximal nerve block right aspirated the joint took out a small amount of fluid from the third MPJ. Patient is due to go on a golf trip to [place] the end of May we are trying to get him better prior to this

## 2015-02-23 ENCOUNTER — Other Ambulatory Visit: Payer: Self-pay

## 2015-02-24 ENCOUNTER — Other Ambulatory Visit: Payer: Self-pay

## 2015-02-24 ENCOUNTER — Encounter: Payer: Self-pay | Admitting: Internal Medicine

## 2015-02-24 ENCOUNTER — Ambulatory Visit (INDEPENDENT_AMBULATORY_CARE_PROVIDER_SITE_OTHER): Payer: BLUE CROSS/BLUE SHIELD | Admitting: Internal Medicine

## 2015-02-24 VITALS — BP 110/54 | HR 75 | Ht 73.0 in | Wt 222.0 lb

## 2015-02-24 DIAGNOSIS — I480 Paroxysmal atrial fibrillation: Secondary | ICD-10-CM | POA: Diagnosis not present

## 2015-02-24 NOTE — Patient Instructions (Signed)

## 2015-02-24 NOTE — Progress Notes (Signed)
PCP: Unice Cobble, MD  Randy Cantu is a 64 y.o. male who presents today for routine electrophysiology followup.  Since last being seen in our clinic, the patient reports doing very well.  His afib is reasonably well controlled presently.  He does have occasional episodes which are short lvied. Today, he denies symptoms of  chest pain, shortness of breath,  lower extremity edema, dizziness, presyncope, or syncope.  The patient is otherwise without complaint today.   Past Medical History  Diagnosis Date  . Allergic rhinitis   . HLD (hyperlipidemia)   . PAF (paroxysmal atrial fibrillation)     s/p afib ablation 11/17/10  . Lactose intolerance   . Periodic limb movement disorder (PLMD)   . Right foot pain     all of foot 1--/2016   Past Surgical History  Procedure Laterality Date  . Tonsillectomy    . Atrial fibrillation ablation  11/17/10    PVI by Dr Rayann Heman    Current Outpatient Prescriptions  Medication Sig Dispense Refill  . aspirin 81 MG tablet Take 81 mg by mouth daily.    . clonazePAM (KLONOPIN) 0.5 MG tablet Take 1 tablet (0.5 mg total) by mouth at bedtime. 90 tablet 1  . diclofenac (CATAFLAM) 50 MG tablet Take 50 mg by mouth daily as needed.     . flecainide (TAMBOCOR) 100 MG tablet Take 100 mg by mouth 2 (two) times daily. Take 150 mg by mouth in the am and take 100 mg by mouth in the pm    . GLUCOSAMINE-CHONDROITIN PO Take 1,500 mg by mouth 2 (two) times daily.    Marland Kitchen MEGARED OMEGA-3 KRILL OIL PO Take 1 tablet by mouth daily.    . meloxicam (MOBIC) 15 MG tablet Take 1 tablet (15 mg total) by mouth daily. 30 tablet 2  . montelukast (SINGULAIR) 10 MG tablet Take 1 tablet by mouth at bedtime.  1  . Multiple Vitamin (MULTIVITAMIN) capsule Take 1 capsule by mouth daily.    . pravastatin (PRAVACHOL) 40 MG tablet Take 1 tablet (40 mg total) by mouth daily. 90 tablet 0  . predniSONE (DELTASONE) 10 MG tablet 12 day tapering dose 48 tablet 0  . ranitidine (ZANTAC) 150 MG capsule  Take 150 mg by mouth daily as needed for heartburn.    . verapamil (VERELAN PM) 120 MG 24 hr capsule Take 1 capsule (120 mg total) by mouth daily. 90 capsule 0   No current facility-administered medications for this visit.    Physical Exam: Filed Vitals:   02/24/15 1511  BP: 110/54  Pulse: 75  Height: 6\' 1"  (1.854 m)  Weight: 222 lb (100.699 kg)    GEN- The patient is well appearing, alert and oriented x 3 today.   Head- normocephalic, atraumatic Eyes-  Sclera clear, conjunctiva pink Ears- hearing intact Oropharynx- clear Lungs- Clear to ausculation bilaterally, normal work of breathing Heart- Regular rate and rhythm, no murmurs, rubs or gallops, PMI not laterally displaced GI- soft, NT, ND, + BS Extremities- no clubbing, cyanosis, or edema  ekg today reveals sinus rhythm 75 bpm, PR 202 msec, otherwise normal ekg  Assessment and Plan:  1. Paroxysmal atrial fibrillation chads2vasc score is 0. Stable on flecainide He is not interested in medicine change or repeat ablation at this time  Return in 1 year

## 2015-03-03 ENCOUNTER — Ambulatory Visit (INDEPENDENT_AMBULATORY_CARE_PROVIDER_SITE_OTHER): Payer: BLUE CROSS/BLUE SHIELD | Admitting: Podiatry

## 2015-03-03 VITALS — BP 151/82 | HR 57 | Resp 12

## 2015-03-03 DIAGNOSIS — M779 Enthesopathy, unspecified: Secondary | ICD-10-CM

## 2015-03-03 DIAGNOSIS — M205X1 Other deformities of toe(s) (acquired), right foot: Secondary | ICD-10-CM | POA: Diagnosis not present

## 2015-03-03 NOTE — Patient Instructions (Signed)

## 2015-03-04 NOTE — Progress Notes (Signed)
Subjective:     Patient ID: Randy Cantu, male   DOB: 06/16/1951, 64 y.o.   MRN: [id number]  HPI patient presents with discomfort in the lesser MPJs right over left that is improved but still present for second pair of orthotics   Review of Systems     Objective:   Physical Exam Neurovascular status intact muscle strength adequate with discomfort in the lesser MPJ right over left of a mild nature at this time    Assessment:     Persistent capsulitis right over left third MPJ with no indication of systemic involvement    Plan:     Reviewed condition and treatment strategies included rigid bottom shoes padding and dispensed second pair of orthotics which did not cover the areas that he wanted and we will send them back to be rehabbed. He will continue with his orthotics at this time and we will put her [place] these for him

## 2015-03-18 ENCOUNTER — Telehealth: Payer: Self-pay | Admitting: Podiatry

## 2015-03-18 NOTE — Telephone Encounter (Signed)
Pt called asking if his orthotics are in. Please call pt and let him know.

## 2015-03-26 ENCOUNTER — Other Ambulatory Visit: Payer: Self-pay | Admitting: Internal Medicine

## 2015-03-26 NOTE — Telephone Encounter (Signed)
Called patient he came in and picked up his orthotics will try them and let us know if the need further adjustment

## 2015-04-23 ENCOUNTER — Other Ambulatory Visit: Payer: Self-pay | Admitting: *Deleted

## 2015-04-26 MED ORDER — FLECAINIDE ACETATE 100 MG PO TABS: ORAL_TABLET | ORAL | Status: DC

## 2015-04-26 NOTE — Telephone Encounter (Signed)
Dr Rayann Heman saw the patient in April.  It can be filled under him

## 2015-05-05 ENCOUNTER — Other Ambulatory Visit: Payer: Self-pay | Admitting: Internal Medicine

## 2015-05-05 ENCOUNTER — Other Ambulatory Visit: Payer: Self-pay

## 2015-05-05 MED ORDER — FLECAINIDE ACETATE 100 MG PO TABS: 100.0000 mg | ORAL_TABLET | Freq: Every day | ORAL | Status: DC

## 2015-05-18 ENCOUNTER — Other Ambulatory Visit: Payer: Self-pay

## 2015-05-18 MED ORDER — FLECAINIDE ACETATE 150 MG PO TABS: ORAL_TABLET | ORAL | Status: DC

## 2015-07-27 ENCOUNTER — Other Ambulatory Visit: Payer: Self-pay | Admitting: Neurology

## 2015-07-28 ENCOUNTER — Other Ambulatory Visit: Payer: Self-pay

## 2015-07-28 DIAGNOSIS — G478 Other sleep disorders: Secondary | ICD-10-CM

## 2015-07-28 DIAGNOSIS — G4752 REM sleep behavior disorder: Secondary | ICD-10-CM

## 2015-07-28 MED ORDER — CLONAZEPAM 0.5 MG PO TABS: 0.5000 mg | ORAL_TABLET | Freq: Every day | ORAL | Status: DC

## 2015-08-11 ENCOUNTER — Other Ambulatory Visit: Payer: Self-pay | Admitting: Internal Medicine

## 2015-08-11 MED ORDER — FLECAINIDE ACETATE 100 MG PO TABS: 100.0000 mg | ORAL_TABLET | Freq: Every day | ORAL | Status: DC

## 2015-08-11 MED ORDER — FLECAINIDE ACETATE 150 MG PO TABS: ORAL_TABLET | ORAL | Status: DC

## 2015-08-11 MED ORDER — VERAPAMIL HCL ER 120 MG PO CP24: 120.0000 mg | ORAL_CAPSULE | Freq: Every day | ORAL | Status: DC

## 2015-08-16 ENCOUNTER — Other Ambulatory Visit: Payer: Self-pay

## 2015-08-16 DIAGNOSIS — G478 Other sleep disorders: Secondary | ICD-10-CM

## 2015-08-16 DIAGNOSIS — G4752 REM sleep behavior disorder: Secondary | ICD-10-CM

## 2015-08-16 MED ORDER — CLONAZEPAM 0.5 MG PO TABS: 0.5000 mg | ORAL_TABLET | Freq: Every day | ORAL | Status: DC

## 2015-08-16 NOTE — Telephone Encounter (Signed)
Rx signed and faxed.

## 2015-11-24 ENCOUNTER — Encounter: Payer: Self-pay | Admitting: Internal Medicine

## 2015-12-08 ENCOUNTER — Ambulatory Visit (INDEPENDENT_AMBULATORY_CARE_PROVIDER_SITE_OTHER): Payer: 59 | Admitting: Neurology

## 2015-12-08 ENCOUNTER — Encounter: Payer: Self-pay | Admitting: Neurology

## 2015-12-08 VITALS — BP 104/76 | HR 66 | Resp 20 | Ht 73.0 in | Wt 218.0 lb

## 2015-12-08 DIAGNOSIS — G478 Other sleep disorders: Secondary | ICD-10-CM

## 2015-12-08 DIAGNOSIS — G4752 REM sleep behavior disorder: Secondary | ICD-10-CM

## 2015-12-08 MED ORDER — CLONAZEPAM 0.5 MG PO TABS: 0.5000 mg | ORAL_TABLET | Freq: Every day | ORAL | Status: DC

## 2015-12-08 NOTE — Patient Instructions (Signed)
Melatonin oral solution  What is this medicine?  MELATONIN (mel uh TOH nin) is a dietary supplement. It is promoted to help maintain normal sleep patterns. The FDA has not approved this supplement for any medical use.  This supplement may be used for other purposes; ask your health care provider or pharmacist if you have questions.  This medicine may be used for other purposes; ask your health care provider or pharmacist if you have questions.  What should I tell my health care provider before I take this medicine?  They need to know if you have any of these conditions:  -asthma  -cancer  -depression or mental illness  -diabetes  -hormone problems  -if you often drink alcohol  -immune system problems  -liver disease  -organ transplant  -seizure disorder  -an unusual or allergic reaction to melatonin, other medicines, foods, dyes, or preservatives  -pregnant or trying to get pregnant  -breast-feeding  How should I use this medicine?  Take this supplement by mouth with a glass of water. Do not take with food. Use a specially marked spoon or container to measure each dose. Ask your pharmacist if you do not have one. Household spoons are not accurate. This supplement is usually taken 1 or 2 hours before bedtime. After taking this supplement, limit your activities to those needed to prepare for bed. Follow the directions on the package labeling, or take as directed by your health care professional. Do not take this supplement more often than directed.  Talk to your pediatrician regarding the use of this supplement in children. Special care may be needed. This supplement is not recommended for use in children without a prescription.  Overdosage: If you think you have taken too much of this medicine contact a poison control center or emergency room at once.  NOTE: This medicine is only for you. Do not share this medicine with others.  What if I miss a dose?  If you miss a dose, take it as soon as you can. If it is almost  time for your next dose, take only that dose. Do not take double or extra doses.  What may interact with this medicine?  Do not take this medicine with any of the following medications:  -fluvoxamine  -ramelteon  -tasimelteon  This medicine may also interact with the following medications:  -alcohol  -atazanavir  -caffeine  -carbamazepine  -certain antibiotics like ciprofloxacin, enoxacin  -certain medicines for depression, anxiety, or psychotic disturbances  -cimetidine  -male hormones, like estrogens and birth control pills, patches, rings, or injections  -methoxsalen  -nifedipine  -other medications for sleep  -other herbal or dietary supplements  -phenobarbital  -rifampin  -smoking tobacco  -tamoxifen  -treatments for cancer, organ transplant, or immune disorders  This list may not describe all possible interactions. Give your health care provider a list of all the medicines, herbs, non-prescription drugs, or dietary supplements you use. Also tell them if you smoke, drink alcohol, or use illegal drugs. Some items may interact with your medicine.  What should I watch for while using this medicine?  If you are already being treated for insomnia use this supplement only by your doctor's direction. This supplement may interfere with other treatments. See your doctor if your symptoms do not get better or if they get worse.  You may get drowsy or dizzy. Do not drive, use machinery, or do anything that needs mental alertness until you know how this medicine affects you. Do not stand or   sit up quickly, especially if you are an older patient. This reduces the risk of dizzy or fainting spells. Alcohol may interfere with the effect of this medicine. Avoid alcoholic drinks.  Herbal or dietary supplements are not regulated like medicines. Rigid quality control standards are not required for dietary supplements. The purity and strength of these products can vary. The safety and effect of this dietary supplement for a  certain disease or illness is not well known. This product is not intended to diagnose, treat, cure or prevent any disease.  The Food and Drug Administration suggests the following to help consumers protect themselves:  -Always read product labels and follow directions.  -Natural does not mean a product is safe for humans to take.  -Look for products that include USP after the ingredient name. This means that the manufacturer followed the standards of the [place] Pharmacopoeia.  -Supplements made or sold by a nationally known food or drug company are more likely to be made under tight controls. You can write to the company for more information about how the product was made.  What side effects may I notice from receiving this medicine?  Side effects that you should report to your doctor or health care professional as soon as possible:  -allergic reactions like skin rash, itching or hives, swelling of the face, lips, or tongue  -breathing problems  -change in sex drive or performance  -confusion  -depressed mood, irritable, or other changes in moods or behaviors  -fast, irregular heartbeat  -feeling faint or lightheaded, falls  -irregular or missed menstrual periods  -leakage of milk from the nipples in a person who is not breast-feeding  -signs and symptoms of liver injury like dark yellow or brown urine; general ill feeling or flu-like symptoms; light-colored stools; loss of appetite; nausea; right upper belly pain; unusually weak or tired; yellowing of the eyes or skin  -sleep-walking  -trouble staying awake or alert during the day  -unusual activities while you are still asleep like driving, eating, making phone calls  -unusual bleeding or bruising  Side effects that usually do not require medical attention (report to your doctor or health care professional if they continue or are bothersome):  -dizziness  -drowsiness  -headache  -tiredness  -unusual dreams or nightmares  -upset stomach  This list may not describe all  possible side effects. Call your doctor for medical advice about side effects. You may report side effects to FDA at 1-800-FDA-1088.  Where should I keep my medicine?  Keep out of the reach of children.  Store as directed on the package label. Throw away any unused supplement after the expiration date.  NOTE: This sheet is a summary. It may not cover all possible information. If you have questions about this medicine, talk to your doctor, pharmacist, or health care provider.     © 2016, [employer]. (2014-06-09 14:01:14)

## 2015-12-08 NOTE — Progress Notes (Signed)
Randy Cantu  Provider:  Larey Seat, M D  Referring Provider: Hendricks Limes, MD Primary Care Physician:  [name], MD  Chief Complaint  Patient presents with  . 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 He  is seen here as a  revisit  from Dr. Linna Darner for persistent daytime sleepiness in the absence of apnea.  The patient was evaluated in Dec 2013 for EDS, underwent a PSG on 10-17-12 - but had only frequent PLMs , not associated with a significant arousal index. His  AHI was low, (1.7) .  Dr. Sabra Heck endorsed  the Epworth sleepiness score at 16 points during our visit on 11-01-12. He had also reported diplopia intermittent and it was concerned that this may be related to his treatment with flecainide also known as tumble core for his atrial fibrillation. His wife noted that he has been numb still acting out dreams behavior, never leaving the bed. He had been prescribed Klonopin which he felt that in all negative or positive effect he may have slept with a deeper or more restorative quality. Unfortunately, his wife felt that he had personality changes and became more of a bruit or impulsive in response to taking the medication.Therefore he discontinued Klonipin. These REM behaviors have still continued, at times he may be a sleep talking sometimes he will cry out or holler loudly . He never leaves his bed , he sometimes thrashing about , often  talking. He seems to produce these behaviors at 3-5 AM, not within the first 90 minutes of sleep.  This seems to be separate form : characterized as jerking upper movements, his wife has used the term myoclonus, twitching and jerking. The is occurs earlier with the first onset of sleep .  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 He restarted Klonopin at 0.5 mg total at bedtime. The patient also suffers from atrial fibrillation, has not woken up with palpitations shortness of breath or any panic or breathing difficulties. His weight control was achieved with verapamil and flecainide. Headaches occur less than once a month and are really not in that timeframe or occurrence to use a preventive medication. He has not been acting out dreams or been sleep talking or loudly snoring at all since his last visit here. Today is a re-visit with routine refills.  Interval history 12-08-15 Ongoing twitching at night, about once ever moth a REM enactment. Vivid dreams. No parkinsonism. Bought a new mattress and would like to go off medication, now that movements not longer bother his wife.   Review of Systems: Out of a complete 14 system review, the patient complains of only the following symptoms, and all other reviewed systems are negative. How likely are you to doze in the following situations: 0 = not likely, 1 = slight chance, 2 = moderate chance, 3 = high chance  Sitting and Reading?1 Watching Television?1 Sitting inactive in a public place (theater or meeting)?2 Lying down in the afternoon when circumstances permit?2 Sitting and talking to someone?3 Sitting quietly after lunch without alcohol?2 In a car, while stopped for a few minutes in traffic?1 As a passenger in a car for an hour without a break?2  Total = 14    He has repeatedly denied having RLS.  No nocturia, he may sometimes more or have coughing at night but  is not hypoxic,  no Palpitations,  he does not wake up with an anxiety or panic spell. There have been no reports of this autonomic spells, fainting or diaphoresis at night. No palpitation from atrial  fib as long as on fleccanide and verapmil.  The diplopia has been intermittent- rare -  very rare,  and is not related to the fatigue,  not correlated with  ptosis, nor  dilated pupil. No diplopia since last visit.  There has been no associated swallowing difficulty, no slurred speech and no respiratory distress.  Vertical diplopia occurred about 4 times in the last 36 month, more than 24 month ago last time.   Epworth 14, FSS 23 points.    Social History   Social History  . Marital Status: Married    Spouse Name: N/A  . Number of Children: 2  . Years of Education: N/A   Occupational History  . DOCTOR/EYE    Social History Main Topics  . Smoking status: Never Smoker   . Smokeless tobacco: Never Used  . Alcohol Use: 4.2 oz/week    7 Glasses of wine per week  . Drug Use: No  . Sexual Activity: Not on file   Other Topics Concern  . 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    Family History  Problem Relation Age of Onset  . Cancer    . Peripheral vascular disease Mother     afib in her 68s  . Aplastic anemia Father   . Prostate cancer Maternal Grandfather   . Leukemia Paternal Grandfather   . Colon cancer Neg Hx     Past Medical History  Diagnosis Date  . Allergic rhinitis   . HLD (hyperlipidemia)   . PAF (paroxysmal atrial fibrillation) ([place])     s/p afib ablation 11/17/10  . Lactose intolerance   . Periodic limb movement disorder (PLMD)   . Right foot pain     all of foot 1--/2016    Past Surgical History  Procedure Laterality Date  . Tonsillectomy    . Atrial fibrillation ablation  11/17/10    PVI by Dr Rayann Heman    Current Outpatient Prescriptions  Medication Sig Dispense Refill  . aspirin 81 MG tablet Take 81 mg by mouth daily.    . clonazePAM (KLONOPIN) 0.5 MG tablet Take 1 tablet (0.5 mg total) by mouth at bedtime. 90 tablet 1  . diclofenac (CATAFLAM) 50 MG tablet Take 50 mg by mouth daily as needed.     . flecainide (TAMBOCOR) 100 MG tablet Take 1 tablet (100 mg total) by mouth daily. by mouth in the pm 90 tablet 3  .  flecainide (TAMBOCOR) 150 MG tablet Take 150 mg by mouth in the am. 90 tablet 3  . GLUCOSAMINE-CHONDROITIN PO Take 1,500 mg by mouth 2 (two) times daily.    Marland Kitchen MEGARED OMEGA-3 KRILL OIL PO Take 1 tablet by mouth daily.    . montelukast (SINGULAIR) 10 MG tablet Take 1 tablet by mouth at bedtime.  1  . Multiple Vitamin (MULTIVITAMIN) capsule Take 1 capsule by mouth daily.    . pravastatin (PRAVACHOL) 40 MG tablet Take 1 tablet (40 mg total) by mouth daily. 90 tablet 0  . ranitidine (ZANTAC) 150 MG  capsule Take 150 mg by mouth daily as needed for heartburn.    . verapamil (VERELAN PM) 120 MG 24 hr capsule Take 1 capsule (120 mg total) by mouth daily. 90 capsule 2   No current facility-administered medications for this visit.    Allergies as of 12/08/2015 - Review Complete 12/08/2015  Allergen Reaction Noted  . Pantoprazole sodium  01/04/2011    Vitals: BP 104/76 mmHg  Pulse 66  Resp 20  Ht 6\' 1"  (1.854 m)  Wt 218 lb (98.884 kg)  BMI 28.77 kg/m2 Last Weight:  Wt Readings from Last 1 Encounters:  12/08/15 218 lb (98.884 kg)   Last Height:   Ht Readings from Last 1 Encounters:  12/08/15 6\' 1"  (1.854 m)    Physical exam:  General: The patient is awake, alert and appears not in acute distress. The patient is well groomed. Head: Normocephalic, atraumatic. Neck is supple. Mallampati 2, neck circumference:17 inches  .  Cardiovascular:  Regular rate and rhythm, without  murmurs or carotid bruit, and without distended neck veins. Respiratory: Lungs are clear to auscultation. Skin:  Without evidence of edema, or rash Trunk: has normal posture.  Neurologic exam : The patient is awake and alert, oriented to place and time. Memory subjective  described as intact.  There is a normal attention span & concentration ability. Speech is fluent without dysarthria, dysphonia or aphasia.  Mood and affect are appropriate.  Cranial nerves: Pupils are equal and briskly reactive to light.  Funduscopic exam without  evidence of pallor .  Extraocular movements  in vertical and horizontal planes intact and without nystagmus.   Hearing to finger rub intact.  Facial sensation intact to fine touch. Facial motor strength is symmetric and tongue and uvula move midline.  Motor exam:   Normal muscle bulk and symmetric normal strength in all extremities. Mild cog -wheeling over each biceps, very mild. .   Sensory:  Fine touch, pinprick and vibration were tested in all extremities. Proprioception is normal.  Coordination: Rapid alternating movements in the fingers/hands is tested and normal. Finger-to-nose maneuver  without evidence of ataxia, dysmetria or tremor.  Gait and station: Patient walks without assistive device, climbs up to the exam table.  Strength within normal limits. Stance is stable and normal. Tandem gait is unfragmented. Romberg testing is normal.  Deep tendon reflexes: in the upper and lower extremities are symmetric and intact. Babinski maneuver  downgoing.   Assessment:  After physical and neurologic examination, review of laboratory studies, imaging, neurophysiology testing and pre-existing records, assessment is  1) REM BD - restarted  klonipin. Well controlled on 0.5 mg nightly. 2) PLM disorder with myoclonus. No RLS.    Plan:  Treatment plan and additional workup :Klonipin refilled , yearly screening visit for PD. Dr Linna Darner got CC.     Yazleemar Strassner, MD

## 2015-12-14 ENCOUNTER — Encounter: Payer: Self-pay | Admitting: Obstetrics & Gynecology

## 2016-03-21 NOTE — Progress Notes (Signed)
ELECTROPHYSIOLOGY CONSULT NOTE  Patient ID: Randy Cantu, MRN: [id number], DOB/AGE: [birth year] 64 y.o. Admit date: (Not on file) Date of Consult: 03/22/2016  Primary Physician: [name], MD Primary Cardiologist:new * Consulting Physiciannone  Chief Complaint:atrial fib and abd pain  HPI Randy Cantu is a 65 y.o. male  Seen To reestablish. He had a history of atrial fibrillation managed medically and subsequently referred for catheter ablation [facility] 1/12. Last seen by him 2016 and managed with a combination of flecainide and verapamil. CHADS-VASc score 0 and not on anticoagulation  He felt like the ablation had no benefit and he continues to struggle but does pretty well as long as he takes his meds.  Unfortunately, recently, he had developed GI pain.  He has a 20+ yrshx of GERD; after his ablation, he developed severe pain which responded to hycosamine.  He has wondered whether the stomach problem could be one of his meds  He denies sleep apnea  Past Medical History  Diagnosis Date  . Allergic rhinitis   . HLD (hyperlipidemia)   . PAF (paroxysmal atrial fibrillation) ([place])     s/p afib ablation 11/17/10  . Lactose intolerance   . Periodic limb movement disorder (PLMD)   . Right foot pain     all of foot 1--/2016      Surgical History:  Past Surgical History  Procedure Laterality Date  . Tonsillectomy    . Atrial fibrillation ablation  11/17/10    PVI by Dr Rayann Heman     Home Meds: Prior to Admission medications   Medication Sig Start Date End Date Taking? Authorizing Provider  aspirin 81 MG tablet Take 81 mg by mouth daily.    Historical Provider, MD  clonazePAM (KLONOPIN) 0.5 MG tablet Take 1 tablet (0.5 mg total) by mouth at bedtime. 12/08/15   Asencion Partridge Dohmeier, MD  diclofenac (CATAFLAM) 50 MG tablet Take 50 mg by mouth daily as needed.  03/24/11   Historical Provider, MD  flecainide (TAMBOCOR) 100 MG tablet Take 1 tablet (100 mg total) by mouth daily. by mouth  in the pm 08/11/15   Thompson Grayer, MD  flecainide (TAMBOCOR) 150 MG tablet Take 150 mg by mouth in the am. 08/11/15   Thompson Grayer, MD  GLUCOSAMINE-CHONDROITIN PO Take 1,500 mg by mouth 2 (two) times daily.    Historical Provider, MD  MEGARED OMEGA-3 KRILL OIL PO Take 1 tablet by mouth daily.    Historical Provider, MD  montelukast (SINGULAIR) 10 MG tablet Take 1 tablet by mouth at bedtime. 02/09/15   Historical Provider, MD  Multiple Vitamin (MULTIVITAMIN) capsule Take 1 capsule by mouth daily.    Historical Provider, MD  pravastatin (PRAVACHOL) 40 MG tablet Take 1 tablet (40 mg total) by mouth daily. 01/06/15   Thompson Grayer, MD  ranitidine (ZANTAC) 150 MG capsule Take 150 mg by mouth daily as needed for heartburn.    Historical Provider, MD  verapamil (VERELAN PM) 120 MG 24 hr capsule Take 1 capsule (120 mg total) by mouth daily. 08/11/15   Thompson Grayer, MD    Allergies:  Allergies  Allergen Reactions  . Pantoprazole Sodium     REACTION: rash from this or from  topical disinfectant    Social History   Social History  . Marital Status: Married    Spouse Name: N/A  . Number of Children: 2  . Years of Education: N/A   Occupational History  . DOCTOR/EYE    Social History Main Topics  .  Smoking status: Never Smoker   . Smokeless tobacco: Never Used  . Alcohol Use: 4.2 oz/week    7 Glasses of wine per week  . Drug Use: No  . Sexual Activity: Not on file   Other Topics Concern  . 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     Family History  Problem Relation Age of Onset  . Cancer    . Peripheral vascular disease Mother     afib in her 19s  . Aplastic anemia Father   . Prostate cancer Maternal Grandfather   . Leukemia Paternal Grandfather   . Colon cancer Neg Hx      ROS:  Please see the history of present illness.     All other systems reviewed and negative.    Physical Exam: Blood  pressure 112/74, pulse 57, height 6\' 1"  (1.854 m), weight 219 lb (99.338 kg). General: Well developed, well nourished male in no acute distress. Head: Normocephalic, atraumatic, sclera non-icteric, no xanthomas, nares are without discharge. EENT: normal  Lymph Nodes:  none Neck: Negative for carotid bruits. JVD not elevated. Back:without scoliosis kyphosis Lungs: Clear bilaterally to auscultation without wheezes, rales, or rhonchi. Breathing is unlabored. Heart: RRR with S1 S2. No   murmur . No rubs, or gallops appreciated. Abdomen: Soft, non-tender, non-distended with normoactive bowel sounds. No hepatomegaly. No rebound/guarding. No obvious abdominal masses. Msk:  Strength and tone appear normal for age. Extremities: No clubbing or cyanosis. No  edema.  Distal pedal pulses are 2+ and equal bilaterally. Skin: Warm and Dry Neuro: Alert and oriented X 3. CN III-XII intact Grossly normal sensory and motor function . Psych:  Responds to questions appropriately with a normal affect.      Labs: Cardiac Enzymes No results for input(s): CKTOTAL, CKMB, TROPONINI in the last 72 hours. CBC Lab Results  Component Value Date   WBC 5.5 06/20/2011   HGB 16.3 06/20/2011   HCT 48.6 06/20/2011   MCV 98.3 06/20/2011   PLT 229.0 06/20/2011   PROTIME: No results for input(s): LABPROT, INR in the last 72 hours. Chemistry No results for input(s): NA, K, CL, CO2, BUN, CREATININE, CALCIUM, PROT, BILITOT, ALKPHOS, ALT, AST, GLUCOSE in the last 168 hours.  Invalid input(s): LABALBU Lipids Lab Results  Component Value Date   CHOL 170 08/23/2012   HDL 45.30 08/23/2012   LDLCALC 102* 08/23/2012   TRIG 113.0 08/23/2012   BNP No results found for: PROBNP Thyroid Function Tests: No results for input(s): TSH, T4TOTAL, T3FREE, THYROIDAB in the last 72 hours.  Invalid input(s): FREET3 Miscellaneous No results found for: DDIMER  Radiology/Studies:  No results found.  EKG: NSR   57 19/10/43   Assessment and Plan:   Paroxysmal atrial fibrillation  Abdominal pain    The patient continues with paroxysms of atrial fibrillation largely controlled by flecainide. The concern is whether the flecainide or other drugs may be contributing to his GI discomfort. Hence, we will take an exclusion all of his medications apart from the flecainide and his GE reflux medications. We discussed the issue of concomitant AV nodal blockade in the context of a 1C agent; however, given the good control in the past with flecainide in the short-term nature of the trial I think we will be safe.  If after 2 weeks he continues with GI distress we will substitute dronaderone 400 for a couple of weeks and  see if the flecainide is the culprit. In the event that nothing makes a difference, he is agreeable to going to see GI  I have him encouraged him to reconsider ablation either with [facility] who has 5 more years experience or [facility]  He has been leaning to the latter  [employer]

## 2016-03-22 ENCOUNTER — Encounter: Payer: Self-pay | Admitting: Internal Medicine

## 2016-03-22 ENCOUNTER — Ambulatory Visit (INDEPENDENT_AMBULATORY_CARE_PROVIDER_SITE_OTHER): Payer: 59 | Admitting: Internal Medicine

## 2016-03-22 VITALS — BP 112/74 | HR 57 | Ht 73.0 in | Wt 219.0 lb

## 2016-03-22 DIAGNOSIS — I480 Paroxysmal atrial fibrillation: Secondary | ICD-10-CM

## 2016-03-22 NOTE — Patient Instructions (Signed)
Medication Instructions: 1) Hold clonazepam, pravastatin, and verapamil x 2 weeks to see if symptoms improve 2) After 2 weeks- symptoms DO NOT improve, then stop flecainide and start Multaq 400 mg one tablet by mouth twice daily  Labwork: - none  Procedures/Testing: - Your physician has requested that you have en exercise stress myoview. For further information please visit [email or web address]. Please follow instruction sheet, as given.  Follow-Up: - Call the office in a few weeks to let Dr. Caryl Comes know how you are doing with the medicine changes- [phone] Conn, RN.  - Your physician wants you to follow-up in: 1 year with Dr. Caryl Comes. You will receive a reminder letter in the mail two months in advance. If you don't receive a letter, please call our office to schedule the follow-up appointment.   Any Additional Special Instructions Will Be Listed Below (If Applicable).     If you need a refill on your cardiac medications before your next appointment, please call your pharmacy.

## 2016-04-21 ENCOUNTER — Other Ambulatory Visit: Payer: Self-pay | Admitting: Internal Medicine

## 2016-04-24 ENCOUNTER — Telehealth (HOSPITAL_COMMUNITY): Payer: Self-pay | Admitting: *Deleted

## 2016-04-24 NOTE — Telephone Encounter (Signed)
Left message on voicemail per DPR in reference to upcoming appointment scheduled on 04/27/16 at 0745 with detailed instructions given per Myocardial Perfusion Study Information Sheet for the test. LM to arrive 15 minutes early, and that it is imperative to arrive on time for appointment to keep from having the test rescheduled. If you need to cancel or reschedule your appointment, please call the office within 24 hours of your appointment. Failure to do so may result in a cancellation of your appointment, and a $50 no show fee. Phone number given for call back for any questions. Ramirez Fullbright, Ranae Palms

## 2016-04-26 ENCOUNTER — Telehealth: Payer: Self-pay | Admitting: Internal Medicine

## 2016-04-26 NOTE — Telephone Encounter (Signed)
I left a message for the patient to call at his home/ cell #'s. Insurance is not approving nuclear stress test scheduled for 6/22. Per Dr. Caryl Comes- ok to switch to standard GXT.

## 2016-04-26 NOTE — Telephone Encounter (Signed)
Patient with recurrent chest pain. We are also using flecainide for atrial fibrillation. Excluding coronary disease with our highest sensitivity is important to protect from pro arrhythmia. Nuclear imaging for this opportunity to a greater degree than stated treadmill testing.

## 2016-04-26 NOTE — Telephone Encounter (Signed)
I spoke with the patient. He is aware that his nuclear test is being denied for tomorrow, but that we can proceed with a standard GXT. After discussion with the patient, he reports that he is having some chest tightness intermittently, but not related to activity. He does get some SOB with activity. I advised the patient that we can re-submit his nuclear test to his insurance, but will need to cancel this for tomorrow. He is agreeable with this and would prefer that we try to re-submit prior to proceeding with standard GXT.  He also states that he stopped the clonazepam, pravastatin, & verapamil for a couple of weeks, he ended up stopping flecainide and trying multaq. He states that the 2nd day of Multaq, he went in to a-fib and he also had a few episodes of explosive diarrhea. He stopped multaq and restarted flecainide.  He restarted the clonazepam, but is going to try to wean off of this for sleep. He is still off pravastatin & verapamil. A-fib is under control at this time. Per the patient, he feels that he is having some trouble with lactose, so he has eliminated this from his diet and this seems to have helped.  I advised him I will review with Dr. Caryl Comes and we will also try to re-submit for his nuclear stress test. He is agreeable.

## 2016-04-27 ENCOUNTER — Ambulatory Visit (HOSPITAL_COMMUNITY): Payer: 59

## 2016-05-04 ENCOUNTER — Other Ambulatory Visit: Payer: Self-pay | Admitting: *Deleted

## 2016-05-04 DIAGNOSIS — R0602 Shortness of breath: Secondary | ICD-10-CM

## 2016-05-04 DIAGNOSIS — R079 Chest pain, unspecified: Secondary | ICD-10-CM

## 2016-05-04 NOTE — Progress Notes (Signed)
[name], MD at 04/26/2016 1:59 PM     Status: Signed       Expand All Collapse All   Patient with recurrent chest pain. We are also using flecainide for atrial fibrillation. Excluding coronary disease with our highest sensitivity is important to protect from pro arrhythmia. Nuclear imaging for this opportunity to a greater degree than stated treadmill testing.

## 2016-05-08 ENCOUNTER — Encounter: Payer: Self-pay | Admitting: Internal Medicine

## 2016-05-08 NOTE — Telephone Encounter (Signed)
Dr. Caryl Comes did a peer to peer review today: Per Dr. Laurance Flatten- approval # [id number] Through 06/22/16.  Staff message to pre-cert/ [facility] to please schedule the patient.

## 2016-05-08 NOTE — Progress Notes (Unsigned)
Conversations continue with the patient regarding exertional chest discomfort and shortness of breath. This has been increasingly a problem of late.

## 2016-05-11 ENCOUNTER — Telehealth: Payer: Self-pay | Admitting: Internal Medicine

## 2016-05-11 NOTE — Telephone Encounter (Signed)
Left message for Randy Cantu.  He needs to r/s nuclear stress test.  His insurance has approved it.

## 2016-05-19 ENCOUNTER — Telehealth: Payer: Self-pay | Admitting: Neurology

## 2016-05-19 DIAGNOSIS — G478 Other sleep disorders: Secondary | ICD-10-CM

## 2016-05-19 DIAGNOSIS — G4752 REM sleep behavior disorder: Secondary | ICD-10-CM

## 2016-05-19 MED ORDER — CLONAZEPAM 0.5 MG PO TABS: 0.5000 mg | ORAL_TABLET | Freq: Every day | ORAL | Status: DC

## 2016-05-19 NOTE — Telephone Encounter (Signed)
Received fax confirmation [phone].

## 2016-05-19 NOTE — Telephone Encounter (Signed)
Patient called back to check status of Rx refill request. 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 Jannifer Franklin, [place]

## 2016-05-19 NOTE — Telephone Encounter (Signed)
Patient requesting refill of clonazePAM (KLONOPIN) 0.5 MG tablet Pharmacy: [facility], [address]  Pt is requesting a 30 day supply. He is leaving for vacation on Sunday. Please call pt to give status

## 2016-05-19 NOTE — Telephone Encounter (Signed)
LMVM for pt on his cell, fax sent over with confirmation.

## 2016-06-07 ENCOUNTER — Telehealth (HOSPITAL_COMMUNITY): Payer: Self-pay | Admitting: *Deleted

## 2016-06-07 NOTE — Telephone Encounter (Signed)
Patient given detailed instructions per Myocardial Perfusion Study Information Sheet for the test on 06/13/16 at 0945. Patient notified to arrive 15 minutes early and that it is imperative to arrive on time for appointment to keep from having the test rescheduled.  If you need to cancel or reschedule your appointment, please call the office within 24 hours of your appointment. Failure to do so may result in a cancellation of your appointment, and a $50 no show fee. Patient verbalized understanding.Virgia Kelner, Ranae Palms

## 2016-06-13 ENCOUNTER — Ambulatory Visit (HOSPITAL_COMMUNITY): Payer: 59 | Attending: Cardiology

## 2016-06-13 ENCOUNTER — Encounter (INDEPENDENT_AMBULATORY_CARE_PROVIDER_SITE_OTHER): Payer: Self-pay

## 2016-06-13 DIAGNOSIS — R0609 Other forms of dyspnea: Secondary | ICD-10-CM | POA: Diagnosis not present

## 2016-06-13 DIAGNOSIS — R0602 Shortness of breath: Secondary | ICD-10-CM | POA: Insufficient documentation

## 2016-06-13 DIAGNOSIS — R0789 Other chest pain: Secondary | ICD-10-CM | POA: Insufficient documentation

## 2016-06-13 DIAGNOSIS — R002 Palpitations: Secondary | ICD-10-CM | POA: Diagnosis not present

## 2016-06-13 DIAGNOSIS — R079 Chest pain, unspecified: Secondary | ICD-10-CM

## 2016-06-13 LAB — MYOCARDIAL PERFUSION IMAGING
CHL CUP MPHR: 156 {beats}/min
CHL CUP NUCLEAR SSS: 6
CHL CUP RESTING HR STRESS: 51 {beats}/min
CHL RATE OF PERCEIVED EXERTION: 19
CSEPED: 11 min
CSEPEDS: 17 s
CSEPHR: 85 %
Estimated workload: 13.4 METS
LHR: 0.37
LV dias vol: 126 mL (ref 62–150)
LV sys vol: 41 mL
Peak HR: 133 {beats}/min
SDS: 1
SRS: 5
TID: 1.05

## 2016-06-13 MED ORDER — TECHNETIUM TC 99M TETROFOSMIN IV KIT
10.3000 | PACK | Freq: Once | INTRAVENOUS | Status: AC | PRN
  Administered 2016-06-13: 10 via INTRAVENOUS
  Filled 2016-06-13: qty 10

## 2016-06-13 MED ORDER — TECHNETIUM TC 99M TETROFOSMIN IV KIT
32.8000 | PACK | Freq: Once | INTRAVENOUS | Status: AC | PRN
  Administered 2016-06-13: 32.8 via INTRAVENOUS
  Filled 2016-06-13: qty 33

## 2016-06-16 ENCOUNTER — Telehealth: Payer: Self-pay

## 2016-06-16 NOTE — Telephone Encounter (Signed)
Pt is aware of results of stress test. Pt has questions regarding whether his current medication therapy could be causing his "EF to be low. I explained to the pt that having and EF of 68% doesn't mean he has a low EF. Pt states that he has concerns regarding his HR. Pt states that he is accustomed to his HR being 140-150 bpm with exertion and and now his HR with exertion is only about 130 bpm, pt is concerned with if his current medication theraphy could be attributing to this. Pt states that his afib has been going in and out of afib since 7/4 and has been taking Tikosyn and verapamil. Pt also wants to know if "widow make veins" were seen in this stress test and if so do you have a treatment plan for them.

## 2016-06-25 ENCOUNTER — Other Ambulatory Visit: Payer: Self-pay | Admitting: Internal Medicine

## 2016-07-05 ENCOUNTER — Other Ambulatory Visit: Payer: Self-pay | Admitting: *Deleted

## 2016-07-05 ENCOUNTER — Other Ambulatory Visit: Payer: Self-pay | Admitting: Neurology

## 2016-07-05 ENCOUNTER — Telehealth: Payer: Self-pay | Admitting: *Deleted

## 2016-07-05 DIAGNOSIS — G478 Other sleep disorders: Secondary | ICD-10-CM

## 2016-07-05 DIAGNOSIS — G4752 REM sleep behavior disorder: Secondary | ICD-10-CM

## 2016-07-05 MED ORDER — CLONAZEPAM 0.5 MG PO TABS: 0.5000 mg | ORAL_TABLET | Freq: Every day | ORAL | 1 refills | Status: DC

## 2016-07-05 MED ORDER — VERAPAMIL HCL ER 120 MG PO CP24: 120.0000 mg | ORAL_CAPSULE | Freq: Every day | ORAL | 2 refills | Status: DC

## 2016-07-05 MED ORDER — PRAVASTATIN SODIUM 40 MG PO TABS: 40.0000 mg | ORAL_TABLET | Freq: Every day | ORAL | 2 refills | Status: DC

## 2016-07-05 NOTE — Telephone Encounter (Signed)
Patient requesting refill of  clonazePAM (KLONOPIN) 0.5 MG tablet 90 day supply  Pharmacy: [facility] requesting call to verify that [facility] received rx. [phone]

## 2016-07-05 NOTE — Telephone Encounter (Signed)
To Dr. Klein to review. 

## 2016-07-06 NOTE — Telephone Encounter (Signed)
Per Dr. Caryl Comes, he called and spoke with the patient.

## 2016-07-06 NOTE — Telephone Encounter (Signed)
Per Dr. Caryl Comes- he spoke with the patient- ok to refill flecainide at 150 mg BID.

## 2016-07-06 NOTE — Telephone Encounter (Signed)
I left a message on pt's cell phone, (per DPR) advising him that his RX for klonopin was sent to [facility] and they did confirm receipt. I asked pt to call me back with further questions.

## 2016-07-06 NOTE — Telephone Encounter (Signed)
RX for klonopin faxed to [facility]. Received a receipt of confirmation.

## 2016-07-07 MED ORDER — FLECAINIDE ACETATE 150 MG PO TABS: 150.0000 mg | ORAL_TABLET | Freq: Two times a day (BID) | ORAL | 2 refills | Status: DC

## 2016-07-11 DIAGNOSIS — M722 Plantar fascial fibromatosis: Secondary | ICD-10-CM

## 2016-07-14 ENCOUNTER — Other Ambulatory Visit: Payer: Self-pay | Admitting: Internal Medicine

## 2016-07-19 ENCOUNTER — Telehealth: Payer: Self-pay | Admitting: Podiatry

## 2016-07-19 NOTE — Telephone Encounter (Signed)
Left message that 2nd pair of Orthotics are ready to be picked up

## 2016-07-21 ENCOUNTER — Encounter: Payer: Self-pay | Admitting: *Deleted

## 2016-07-21 NOTE — Progress Notes (Signed)
07/21/16  Patient picked up another pair of custom molded orthotics.

## 2017-02-28 DIAGNOSIS — N5203 Combined arterial insufficiency and corporo-venous occlusive erectile dysfunction: Secondary | ICD-10-CM | POA: Diagnosis not present

## 2017-02-28 DIAGNOSIS — N400 Enlarged prostate without lower urinary tract symptoms: Secondary | ICD-10-CM | POA: Diagnosis not present

## 2017-03-13 ENCOUNTER — Other Ambulatory Visit: Payer: Self-pay | Admitting: Internal Medicine

## 2017-04-03 ENCOUNTER — Telehealth: Payer: Self-pay | Admitting: Internal Medicine

## 2017-04-03 MED ORDER — FLECAINIDE ACETATE 150 MG PO TABS: 150.0000 mg | ORAL_TABLET | Freq: Two times a day (BID) | ORAL | 0 refills | Status: DC

## 2017-04-03 NOTE — Telephone Encounter (Signed)
New message   *STAT* If patient is at the pharmacy, call can be transferred to refill team.   1. Which medications need to be refilled? (please list name of each medication and dose if known) flecainide (TAMBOCOR) 150 MG tablet pravastatin (PRAVACHOL) 40 MG tablet  verapamil (VERELAN PM) 120 MG 24 hr capsule  2. Which pharmacy/location (including street and city if local pharmacy) is medication to be sent to? [facility] on [address]   3. Do they need a 30 day or 90 day supply? 90 day supply

## 2017-04-19 ENCOUNTER — Other Ambulatory Visit: Payer: Self-pay | Admitting: Neurology

## 2017-04-19 DIAGNOSIS — G478 Other sleep disorders: Secondary | ICD-10-CM

## 2017-04-19 DIAGNOSIS — G4752 REM sleep behavior disorder: Secondary | ICD-10-CM

## 2017-04-23 NOTE — Telephone Encounter (Signed)
Pt hasnt been seen since 12/2015. Has a apt coming up in oct 2018. Pt requesting refill on Klonopin. Will send to [facility] for review.

## 2017-04-24 NOTE — Telephone Encounter (Signed)
Faxed printed/signed rx clonazepam to pt pharmacy. Fax: [phone]. Received confirmation.

## 2017-04-25 ENCOUNTER — Other Ambulatory Visit: Payer: Self-pay | Admitting: Internal Medicine

## 2017-06-27 ENCOUNTER — Encounter: Payer: Self-pay | Admitting: Internal Medicine

## 2017-06-30 ENCOUNTER — Other Ambulatory Visit: Payer: Self-pay | Admitting: Internal Medicine

## 2017-07-05 ENCOUNTER — Telehealth: Payer: Self-pay | Admitting: Neurology

## 2017-07-05 NOTE — Telephone Encounter (Signed)
Pt states that for a few months now he has been taking 1 tablet and a half of the clonazePAM (KLONOPIN) 0.5 MG tablet.  Pt is out and is requesting enough until his next appointment which is 08-16-17 Pt would like to have it called into  [facility] [place], [place] AT [address] [place] [phone] (Phone) [phone] (Fax)   Please call pt

## 2017-07-10 ENCOUNTER — Other Ambulatory Visit: Payer: Self-pay | Admitting: Neurology

## 2017-07-10 DIAGNOSIS — G478 Other sleep disorders: Secondary | ICD-10-CM

## 2017-07-10 DIAGNOSIS — G4752 REM sleep behavior disorder: Secondary | ICD-10-CM

## 2017-07-10 MED ORDER — CLONAZEPAM 0.5 MG PO TABS: ORAL_TABLET | ORAL | 0 refills | Status: DC

## 2017-07-10 NOTE — Telephone Encounter (Signed)
Called patient and lvm for him to call back to discuss this refill as the amount that he has been taking was not what was written for him to take which is why he ran out earlier. LVM for pt to call to discuss

## 2017-07-10 NOTE — Telephone Encounter (Signed)
Pt returned Rn's call °

## 2017-07-10 NOTE — Telephone Encounter (Signed)
Called pt and explained that we would order and fax to pharmacy

## 2017-07-10 NOTE — Telephone Encounter (Signed)
Pt called back checking on RX. He has ran out of the medication Sunday night, said he did not rest well last night. Please call to discuss

## 2017-07-18 ENCOUNTER — Ambulatory Visit: Payer: 59 | Admitting: Internal Medicine

## 2017-08-16 ENCOUNTER — Ambulatory Visit: Payer: 59 | Admitting: Neurology

## 2017-08-20 ENCOUNTER — Other Ambulatory Visit: Payer: Self-pay | Admitting: *Deleted

## 2017-08-20 MED ORDER — VERAPAMIL HCL ER 120 MG PO CP24: 120.0000 mg | ORAL_CAPSULE | Freq: Every day | ORAL | 0 refills | Status: DC

## 2017-08-20 MED ORDER — PRAVASTATIN SODIUM 40 MG PO TABS: 40.0000 mg | ORAL_TABLET | Freq: Every day | ORAL | 0 refills | Status: DC

## 2017-08-21 ENCOUNTER — Encounter: Payer: Self-pay | Admitting: Neurology

## 2017-08-21 ENCOUNTER — Ambulatory Visit (INDEPENDENT_AMBULATORY_CARE_PROVIDER_SITE_OTHER): Payer: PPO | Admitting: Neurology

## 2017-08-21 VITALS — BP 122/67 | HR 63 | Ht 73.0 in | Wt 224.0 lb

## 2017-08-21 DIAGNOSIS — G478 Other sleep disorders: Secondary | ICD-10-CM | POA: Diagnosis not present

## 2017-08-21 DIAGNOSIS — R430 Anosmia: Secondary | ICD-10-CM

## 2017-08-21 DIAGNOSIS — G4752 REM sleep behavior disorder: Secondary | ICD-10-CM

## 2017-08-21 DIAGNOSIS — I480 Paroxysmal atrial fibrillation: Secondary | ICD-10-CM | POA: Diagnosis not present

## 2017-08-21 MED ORDER — CLONAZEPAM 0.5 MG PO TABS: ORAL_TABLET | ORAL | 5 refills | Status: DC

## 2017-08-21 MED ORDER — CLONAZEPAM 0.5 MG PO TABS: ORAL_TABLET | ORAL | 1 refills | Status: DC

## 2017-08-21 NOTE — Addendum Note (Signed)
Addended by: Larey Seat on: 08/21/2017 04:00 PM   Modules accepted: Orders

## 2017-08-21 NOTE — Progress Notes (Signed)
[facility]  Provider:  Larey Seat, M D  Referring Provider: [name], MD Primary Care Physician:  [name], MD  Chief Complaint  Patient presents with  . Follow-up    pt states all well. pt alone, rm 10    HPI: Randy Cantu is a 66 y.o., caucasian , married,  male . He  is seen here as a  revisit  from Dr. Orland Mustard for persistent daytime sleepiness in the absence of apnea.  The patient was evaluated in Dec 2013 for EDS, underwent a PSG on 10-17-12 - but had only frequent PLMs , not associated with a significant arousal index. His  AHI was low, (1.7) .  Dr. Sabra Heck endorsed  the Epworth sleepiness score at 16 points during our visit on 11-01-12. He had also reported diplopia intermittent and it was concerned that this may be related to his treatment with flecainide, also known as Tambocor, for his atrial fibrillation. His wife noted that he has been still acting out dreams behavior, but never leaving the bed. He had been prescribed Klonopin which he felt had an allover positive effect-  he may have slept with a deeper or more restorative quality. Unfortunately, his wife felt that he had personality changes and became more of a bruit or impulsive in response to taking the medication.Therefore he discontinued Klonopin. These REM behaviors have still continued, at times he may be a sleep talking sometimes he will cry out or holler loudly . He never leaves his bed , he sometimes thrashing about , often  talking. He seems to produce these behaviors at 3-5 AM, not within the first 90 minutes of sleep.  This seems to be separate form : characterized as jerking upper movements, his wife has used the term myoclonus, twitching and jerking. The is occurs earlier with the first onset of sleep .  Interval history 12-09-14; Dr. Sabra Heck is seen here today in a regular follow-up he does have REM behavior disorder which is well controlled on Klonopin. He restarted Klonopin at  0.5 mg total at bedtime. The patient also suffers from atrial fibrillation, has not woken up with palpitations shortness of breath or any panic or breathing difficulties. His weight control was achieved with verapamil and flecainide. Headaches occur less than once a month and are really not in that timeframe or occurrence to use a preventive medication. He has not been acting out dreams or been sleep talking or loudly snoring at all since his last visit here. Today is a re-visit with routine refills.  Interval history 12-08-15 Ongoing twitching at night, about once ever moth a REM enactment. Vivid dreams. No parkinsonism. Bought a new mattress and would like to go off medication, now that movements not longer bother his wife.    Interval history from 08/21/2017, I have the pleasure of seeing Randy Cantu today following up on his REM behavior disorder, diagnosed about 5 years ago as well as refilling his Klonopin as needed. He remains with a higher than average daytime sleepiness score at 15 and 16 points out of 24 ( Epworth). He drinks coffee or soda, pulse rate remains in the 50's.  Review of Systems: Out of a complete 14 system review, the patient complains of only the following symptoms, and all other reviewed systems are negative. How likely are you to doze in the following situations: 0 = not likely, 1 = slight chance, 2 = moderate chance, 3 = high chance  Sitting and Reading?2  Watching Television?1 Sitting inactive in a public place (theater or meeting)?3 Lying down in the afternoon when circumstances permit?2 Sitting and talking to someone?3 Sitting quietly after lunch without alcohol?1 In a car, while stopped for a few minutes in traffic?1 As a passenger in a car for an hour without a break?2  Total = 15/ 16      Social History   Social History  . Marital status: Married    Spouse name: N/A  . Number of children: 2  . Years of education: N/A   Occupational History   . DOCTOR/EYE Self Employed   Social History Main Topics  . Smoking status: Never Smoker  . Smokeless tobacco: Never Used  . Alcohol use 4.2 oz/week    7 Glasses of wine per week  . Drug use: No  . Sexual activity: Not on file   Other Topics Concern  . 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    Family History  Problem Relation Age of Onset  . Peripheral vascular disease Mother        afib in her 20s  . Aplastic anemia Father   . Cancer Unknown   . Prostate cancer Maternal Grandfather   . Leukemia Paternal Grandfather   . Colon cancer Neg Hx     Past Medical History:  Diagnosis Date  . Allergic rhinitis   . HLD (hyperlipidemia)   . Lactose intolerance   . PAF (paroxysmal atrial fibrillation) ([place])    s/p afib ablation 11/17/10  . Periodic limb movement disorder (PLMD)   . Right foot pain    all of foot 1--/2016    Past Surgical History:  Procedure Laterality Date  . atrial fibrillation ablation  11/17/10   PVI by Dr Rayann Heman  . TONSILLECTOMY      Current Outpatient Prescriptions  Medication Sig Dispense Refill  . clonazePAM (KLONOPIN) 0.5 MG tablet Pt needs to take 1.5 tab at bedtime 45 tablet 0  . diclofenac (CATAFLAM) 50 MG tablet Take 50 mg by mouth daily as needed.     . flecainide (TAMBOCOR) 150 MG tablet TAKE 1 TABLET BY MOUTH TWICE DAILY 180 tablet 0  . pravastatin (PRAVACHOL) 40 MG tablet Take 1 tablet (40 mg total) by mouth daily. 90 tablet 0  . ranitidine (ZANTAC) 150 MG capsule Take 150 mg by mouth daily as needed for heartburn.    . verapamil (VERELAN PM) 120 MG 24 hr capsule Take 1 capsule (120 mg total) by mouth daily. 90 capsule 0   No current facility-administered medications for this visit.     Allergies as of 08/21/2017 - Review Complete 08/21/2017  Allergen Reaction Noted  . Pantoprazole sodium  01/04/2011    Vitals: BP 122/67   Pulse 63   Ht 6\' 1"   (1.854 m)   Wt 224 lb (101.6 kg)   BMI 29.55 kg/m  Last Weight:  Wt Readings from Last 1 Encounters:  08/21/17 224 lb (101.6 kg)   Last Height:   Ht Readings from Last 1 Encounters:  08/21/17 6\' 1"  (1.854 m)    Physical exam:  General: The patient is awake, alert and appears not in acute distress. The patient is well groomed. Head: Normocephalic, atraumatic. Neck is supple. Mallampati 2, neck circumference:17 inches  .  Cardiovascular:  Regular rate and rhythm, without  murmurs or carotid bruit, and without distended neck veins. Respiratory: Lungs are  clear to auscultation. Skin:  Without evidence of edema, or rash Trunk: has normal posture.  Neurologic exam : The patient is awake and alert, oriented to place and time. Memory subjective  described as intact.  There is a normal attention span & concentration ability. Speech is fluent without dysarthria, dysphonia or aphasia.  Mood and affect are appropriate.  Cranial nerves: Pupils are equal.  Extraocular movements in vertical and horizontal planes intact and without nystagmus.  Hearing to finger rub intact. Facial sensation intact to fine touch.  Facial motor strength is symmetric and tongue and uvula move midline.  Motor exam:   Normal muscle bulk and symmetric normal strength in all extremities. Mild cog -wheeling over each biceps, very mild- unchanged  Coordination- very fine finger tremor in both hands, not PD like. .  Gait and station: Patient walks without assistive device. Deep tendon reflexes: in the upper and lower extremities are symmetric and intact. Babinski deferred.  .  Assessment:  After physical and neurologic examination, review of laboratory studies, imaging, neurophysiology testing and pre-existing records, assessment is  1) REM BD - restarted klonipin. Well controlled on 0.5 mg nightly. 2) PLM disorder with myoclonus. Not qualifying as RLS.  3) EDS- continued, preceded atrial fib and related  medication.   Plan:  15 minute RV -Treatment plan and additional workup :  Klonopin refilled , yearly screening EDS/ REM BD visit for PD. Dr Linna Darner   Larey Seat, MD

## 2017-08-21 NOTE — Patient Instructions (Signed)
Clonazepam tablets What is this medicine? CLONAZEPAM (kloe NA ze pam) is a benzodiazepine. It is used to treat certain types of seizures. It is also used to treat panic disorder. This medicine may be used for other purposes; ask your health care provider or pharmacist if you have questions. COMMON BRAND NAME(S): Ceberclon, Klonopin What should I tell my health care provider before I take this medicine? They need to know if you have any of these conditions: -an alcohol or drug abuse problem -bipolar disorder, depression, psychosis or other mental health condition -glaucoma -kidney or liver disease -lung or breathing disease -myasthenia gravis -Parkinson's disease -porphyria -seizures or a history of seizures -suicidal thoughts -an unusual or allergic reaction to clonazepam, other benzodiazepines, foods, dyes, or preservatives -pregnant or trying to get pregnant -breast-feeding How should I use this medicine? Take this medicine by mouth with a glass of water. Follow the directions on the prescription label. If it upsets your stomach, take it with food or milk. Take your medicine at regular intervals. Do not take it more often than directed. Do not stop taking or change the dose except on the advice of your doctor or health care professional. A special MedGuide will be given to you by the pharmacist with each prescription and refill. Be sure to read this information carefully each time. Talk to your pediatrician regarding the use of this medicine in children. Special care may be needed. Overdosage: If you think you have taken too much of this medicine contact a poison control center or emergency room at once. NOTE: This medicine is only for you. Do not share this medicine with others. What if I miss a dose? If you miss a dose, take it as soon as you can. If it is almost time for your next dose, take only that dose. Do not take double or extra doses. What may interact with this medicine? Do  not take this medication with any of the following medicines: -narcotic medicines for cough -sodium oxybate This medicine may also interact with the following medications: -alcohol -antihistamines for allergy, cough and cold -antiviral medicines for HIV or AIDS -certain medicines for anxiety or sleep -certain medicines for depression, like amitriptyline, fluoxetine, sertraline -certain medicines for fungal infections like ketoconazole and itraconazole -certain medicines for seizures like carbamazepine, phenobarbital, phenytoin, primidone -general anesthetics like halothane, isoflurane, methoxyflurane, propofol -local anesthetics like lidocaine, pramoxine, tetracaine -medicines that relax muscles for surgery -narcotic medicines for pain -phenothiazines like chlorpromazine, mesoridazine, prochlorperazine, thioridazine This list may not describe all possible interactions. Give your health care provider a list of all the medicines, herbs, non-prescription drugs, or dietary supplements you use. Also tell them if you smoke, drink alcohol, or use illegal drugs. Some items may interact with your medicine. What should I watch for while using this medicine? Tell your doctor or health care professional if your symptoms do not start to get better or if they get worse. Do not stop taking except on your doctor's advice. You may develop a severe reaction. Your doctor will tell you how much medicine to take. You may get drowsy or dizzy. Do not drive, use machinery, or do anything that needs mental alertness until you know how this medicine affects you. To reduce the risk of dizzy and fainting spells, do not stand or sit up quickly, especially if you are an older patient. Alcohol may increase dizziness and drowsiness. Avoid alcoholic drinks. If you are taking another medicine that also causes drowsiness, you may have more side   effects. Give your health care provider a list of all medicines you use. Your doctor  will tell you how much medicine to take. Do not take more medicine than directed. Call emergency for help if you have problems breathing or unusual sleepiness. The use of this medicine may increase the chance of suicidal thoughts or actions. Pay special attention to how you are responding while on this medicine. Any worsening of mood, or thoughts of suicide or dying should be reported to your health care professional right away. What side effects may I notice from receiving this medicine? Side effects that you should report to your doctor or health care professional as soon as possible: -allergic reactions like skin rash, itching or hives, swelling of the face, lips, or tongue -breathing problems -confusion -loss of balance or coordination -signs and symptoms of low blood pressure like dizziness; feeling faint or lightheaded, falls; unusually weak or tired -suicidal thoughts or mood changes Side effects that usually do not require medical attention (report to your doctor or health care professional if they continue or are bothersome): -dizziness -headache -tiredness -upset stomach This list may not describe all possible side effects. Call your doctor for medical advice about side effects. You may report side effects to FDA at 1-800-FDA-1088. Where should I keep my medicine? Keep out of the reach of children. This medicine can be abused. Keep your medicine in a safe place to protect it from theft. Do not share this medicine with anyone. Selling or giving away this medicine is dangerous and against the law. This medicine may cause accidental overdose and death if taken by other adults, children, or pets. Mix any unused medicine with a substance like cat litter or coffee grounds. Then throw the medicine away in a sealed container like a sealed bag or a coffee can with a lid. Do not use the medicine after the expiration date. Store at room temperature between 15 and 30 degrees C (59 and 86 degrees F).  Protect from light. Keep container tightly closed. NOTE: This sheet is a summary. It may not cover all possible information. If you have questions about this medicine, talk to your doctor, pharmacist, or health care provider.  2018 Elsevier/Gold Standard (2016-03-31 18:46:32)  

## 2017-10-08 ENCOUNTER — Other Ambulatory Visit: Payer: Self-pay | Admitting: Internal Medicine

## 2017-10-08 ENCOUNTER — Ambulatory Visit: Payer: PPO | Admitting: Internal Medicine

## 2017-10-08 ENCOUNTER — Encounter: Payer: Self-pay | Admitting: Internal Medicine

## 2017-10-08 VITALS — BP 110/74 | HR 57 | Ht 73.0 in | Wt 221.0 lb

## 2017-10-08 DIAGNOSIS — I480 Paroxysmal atrial fibrillation: Secondary | ICD-10-CM

## 2017-10-08 DIAGNOSIS — E785 Hyperlipidemia, unspecified: Secondary | ICD-10-CM

## 2017-10-08 MED ORDER — ROSUVASTATIN CALCIUM 10 MG PO TABS: 10.0000 mg | ORAL_TABLET | Freq: Every day | ORAL | 3 refills | Status: DC

## 2017-10-08 MED ORDER — DILTIAZEM HCL ER COATED BEADS 120 MG PO CP24
120.0000 mg | ORAL_CAPSULE | Freq: Every day | ORAL | 3 refills | Status: DC
Start: 2017-10-08 — End: 2018-09-17

## 2017-10-08 MED ORDER — APIXABAN 5 MG PO TABS: 5.0000 mg | ORAL_TABLET | Freq: Two times a day (BID) | ORAL | 6 refills | Status: DC

## 2017-10-08 NOTE — Patient Instructions (Addendum)
Medication Instructions:  Your physician has recommended you make the following change in your medication:  1. STOP Pravachol 2. STOP Verapamil 3. START DILTIAZEM 120 mg daily 4. START ELIQUIS 5 mg twice daily 5. START Rosuvastatin (Crestor) 10 mg daily  * If you need a refill on your cardiac medications before your next appointment, please call your pharmacy. *  Labwork: Your physician recommends that you return for lab work in: 4 weeks for lipid profile  Testing/Procedures: None ordered  Follow-Up: Your physician wants you to follow-up in: 6 months with Dr. Caryl Comes.  You will receive a reminder letter in the mail two months in advance. If you don't receive a letter, please call our office to schedule the follow-up appointment.  Thank you for choosing [facility]!!    Any Other Special Instructions Will Be Listed Below (If Applicable).  Diltiazem extended-release capsules or tablets What is this medicine? DILTIAZEM (dil TYE a zem) is a calcium-channel blocker. It affects the amount of calcium found in your heart and muscle cells. This relaxes your blood vessels, which can reduce the amount of work the heart has to do. This medicine is used to treat high blood pressure and chest pain caused by angina. This medicine may be used for other purposes; ask your health care provider or pharmacist if you have questions. COMMON BRAND NAME(S): Cardizem CD, Cardizem LA, Cardizem SR, Cartia XT, Dilacor XR, Dilt-CD, Diltia XT, Diltzac, Matzim LA, Rema Fendt, Tiamate, Tiazac What should I tell my health care provider before I take this medicine? They need to know if you have any of these conditions: -heart problems, low blood pressure, irregular heartbeat -liver disease -previous heart attack -an unusual or allergic reaction to diltiazem, other medicines, foods, dyes, or preservatives -pregnant or trying to get pregnant -breast-feeding How should I use this medicine? Take this medicine by  mouth with a glass of water. Follow the directions on the prescription label. Swallow whole, do not crush or chew. Ask your doctor or pharmacist if your should take this medicine with food. Take your doses at regular intervals. Do not take your medicine more often then directed. Do not stop taking except on the advice of your doctor or health care professional. Ask your doctor or health care professional how to gradually reduce the dose. Talk to your pediatrician regarding the use of this medicine in children. Special care may be needed. Overdosage: If you think you have taken too much of this medicine contact a poison control center or emergency room at once. NOTE: This medicine is only for you. Do not share this medicine with others. What if I miss a dose? If you miss a dose, take it as soon as you can. If it is almost time for your next dose, take only that dose. Do not take double or extra doses. What may interact with this medicine? Do not take this medicine with any of the following medications: -cisapride -hawthorn -pimozide -ranolazine -red yeast rice This medicine may also interact with the following medications: -buspirone -carbamazepine -cimetidine -cyclosporine -digoxin -local anesthetics or general anesthetics -lovastatin -medicines for anxiety or difficulty sleeping like midazolam and triazolam -medicines for high blood pressure or heart problems -quinidine -rifampin, rifabutin, or rifapentine This list may not describe all possible interactions. Give your health care provider a list of all the medicines, herbs, non-prescription drugs, or dietary supplements you use. Also tell them if you smoke, drink alcohol, or use illegal drugs. Some items may interact with your medicine. What  should I watch for while using this medicine? Check your blood pressure and pulse rate regularly. Ask your doctor or health care professional what your blood pressure and pulse rate should be and  when you should contact him or her. You may feel dizzy or lightheaded. Do not drive, use machinery, or do anything that needs mental alertness until you know how this medicine affects you. To reduce the risk of dizzy or fainting spells, do not sit or stand up quickly, especially if you are an older patient. Alcohol can make you more dizzy or increase flushing and rapid heartbeats. Avoid alcoholic drinks. What side effects may I notice from receiving this medicine? Side effects that you should report to your doctor or health care professional as soon as possible: -allergic reactions like skin rash, itching or hives, swelling of the face, lips, or tongue -confusion, mental depression -feeling faint or lightheaded, falls -redness, blistering, peeling or loosening of the skin, including inside the mouth -slow, irregular heartbeat -swelling of the feet and ankles -unusual bleeding or bruising, pinpoint red spots on the skin Side effects that usually do not require medical attention (report to your doctor or health care professional if they continue or are bothersome): -constipation or diarrhea -difficulty sleeping -facial flushing -headache -nausea, vomiting -sexual dysfunction -weak or tired This list may not describe all possible side effects. Call your doctor for medical advice about side effects. You may report side effects to FDA at 1-800-FDA-1088. Where should I keep my medicine? Keep out of the reach of children. Store at room temperature between 15 and 30 degrees C (59 and 86 degrees F). Protect from humidity. Throw away any unused medicine after the expiration date. NOTE: This sheet is a summary. It may not cover all possible information. If you have questions about this medicine, talk to your doctor, pharmacist, or health care provider.  2018 Elsevier/Gold Standard (2008-02-13 14:35:47)

## 2017-10-08 NOTE — Progress Notes (Signed)
Patient Care Team: [name], MD as PCP - General (Family Medicine) [name], MD as Consulting Physician (Cardiology)   HPI  Randy Cantu is a 66 y.o. male Seen in follow-up for atrial fibrillation managed with a combination of verapamil and flecainide.  He is status post PVI 1/12-JA  DATE PR interval QRSduration Flecainide        12/18 208 108 150   He continues to have infrequent atrial fibrillation.  Most episodes are short.  If he fails to take his medications he has more symptoms.  Symptom months he has noted problems with constipation.   Records and Results Reviewed  Past Medical History:  Diagnosis Date  . Allergic rhinitis   . HLD (hyperlipidemia)   . Lactose intolerance   . PAF (paroxysmal atrial fibrillation) ([place])    s/p afib ablation 11/17/10  . Periodic limb movement disorder (PLMD)   . Right foot pain    all of foot 1--/2016    Past Surgical History:  Procedure Laterality Date  . atrial fibrillation ablation  11/17/10   PVI by Dr Rayann Heman  . TONSILLECTOMY      Current Outpatient Medications  Medication Sig Dispense Refill  . clonazePAM (KLONOPIN) 0.5 MG tablet Pt needs to take 1.5 tab at bedtime 135 tablet 1  . flecainide (TAMBOCOR) 150 MG tablet TAKE 1 TABLET BY MOUTH TWICE DAILY 180 tablet 0  . pravastatin (PRAVACHOL) 40 MG tablet Take 1 tablet (40 mg total) by mouth daily. 90 tablet 0  . ranitidine (ZANTAC) 150 MG capsule Take 150 mg by mouth daily as needed for heartburn.    . verapamil (VERELAN PM) 120 MG 24 hr capsule Take 1 capsule (120 mg total) by mouth daily. 90 capsule 0   No current facility-administered medications for this visit.     Allergies  Allergen Reactions  . Pantoprazole Sodium     REACTION: rash from this or from  topical disinfectant      Review of Systems negative except from HPI and PMH  Physical Exam BP 110/74   Pulse (!) 57   Ht 6\' 1"  (1.854 m)   Wt 221 lb (100.2 kg)   BMI 29.16 kg/m    Well developed and well nourished in no acute distress HENT normal E scleral and icterus clear Neck Supple JVP flat; carotids brisk and full Clear to ausculation Regular rate and rhythm, no murmurs gallops or rub Soft with active bowel sounds No clubbing cyanosis  Edema Alert and oriented, grossly normal motor and sensory function Skin Warm and Dry  ECG demonstrates sinus rhythm    Assessment and  Plan  Atrial fib paroxysmal  Constipation  Hyperlipidemia   The patient is having infrequent paroxysms of atrial fibrillation.  We had a lengthy discussion regarding relative and absolute benefits associated with anticoagulation.  He tells me that his mother and grandmother both had devastating strokes.  He is inclined towards anticoagulation.  We will try him on apixaban.  We have reviewed bleeding risks.  Verapamil may be the cause of his constipation.  We will stop and start him on diltiazem.  He is currently taking pravastatin.  It is been sometime since his lipids were checked.  We will plan to switch him over to rosuvastatin although we will do these medication changes in sequence there as to what might be causing side effects.  More than 50% of 45 min was spent in counseling related to the above  Current medicines are reviewed at length with the patient today .  The patient does   have concerns regarding medicines As above

## 2017-10-15 ENCOUNTER — Ambulatory Visit: Payer: Self-pay | Admitting: Family Medicine

## 2017-11-23 ENCOUNTER — Other Ambulatory Visit: Payer: Self-pay

## 2017-11-23 ENCOUNTER — Telehealth: Payer: Self-pay | Admitting: Family Medicine

## 2017-11-23 ENCOUNTER — Ambulatory Visit: Payer: Self-pay

## 2017-11-23 ENCOUNTER — Ambulatory Visit (INDEPENDENT_AMBULATORY_CARE_PROVIDER_SITE_OTHER)
Admission: RE | Admit: 2017-11-23 | Discharge: 2017-11-23 | Disposition: A | Discharge status: Home / Self Care | Payer: PPO | Source: Ambulatory Visit | Attending: Family Medicine | Admitting: Family Medicine

## 2017-11-23 ENCOUNTER — Other Ambulatory Visit (INDEPENDENT_AMBULATORY_CARE_PROVIDER_SITE_OTHER): Payer: PPO

## 2017-11-23 ENCOUNTER — Encounter: Payer: Self-pay | Admitting: Family Medicine

## 2017-11-23 ENCOUNTER — Ambulatory Visit (INDEPENDENT_AMBULATORY_CARE_PROVIDER_SITE_OTHER): Payer: PPO | Admitting: Family Medicine

## 2017-11-23 VITALS — BP 120/72 | HR 58 | Ht 73.0 in | Wt 228.0 lb

## 2017-11-23 DIAGNOSIS — M542 Cervicalgia: Secondary | ICD-10-CM

## 2017-11-23 DIAGNOSIS — M5412 Radiculopathy, cervical region: Secondary | ICD-10-CM

## 2017-11-23 DIAGNOSIS — M1812 Unilateral primary osteoarthritis of first carpometacarpal joint, left hand: Secondary | ICD-10-CM | POA: Diagnosis not present

## 2017-11-23 DIAGNOSIS — M79642 Pain in left hand: Secondary | ICD-10-CM

## 2017-11-23 LAB — SEDIMENTATION RATE: SED RATE: 1 mm/h (ref 0–20)

## 2017-11-23 LAB — IBC PANEL
Iron: 160 ug/dL (ref 42–165)
SATURATION RATIOS: 48.2 % (ref 20.0–50.0)
Transferrin: 237 mg/dL (ref 212.0–360.0)

## 2017-11-23 LAB — URIC ACID: URIC ACID, SERUM: 6.7 mg/dL (ref 4.0–7.8)

## 2017-11-23 MED ORDER — GABAPENTIN 100 MG PO CAPS: 200.0000 mg | ORAL_CAPSULE | Freq: Every day | ORAL | 3 refills | Status: DC

## 2017-11-23 MED ORDER — DICLOFENAC SODIUM 2 % TD SOLN: 2.0000 g | Freq: Two times a day (BID) | TRANSDERMAL | 3 refills | Status: DC

## 2017-11-23 NOTE — Progress Notes (Signed)
[name] [address] [place], [place] [address] Phone: [phone] Subjective:    I'm seeing this patient by the request  of:    CC: Left hand pain, shoulder pain  [id number]  Randy Cantu is a 67 y.o. male coming in for left hand and shoulder. His thumb on the left hand is bothering him and he is unable to hold onto a pen. He thinks that he hurt his thumb wake surfing on Labor Day. He is also having pain in the 4th finger as well.  Patient states starts to affect some daily activities.  He also complains of shoulder pain on the left side. He was told that he has a partial bicep tendon tear and is worried that he is going to tear his bicep as his friend did. He has had pain for 20 years. Push ups at the gym typically induced. Denies any radiating pain in the arm. He is left handed.  The severity of pain is 4 out of 10.      Past Medical History:  Diagnosis Date  . Allergic rhinitis   . HLD (hyperlipidemia)   . Lactose intolerance   . PAF (paroxysmal atrial fibrillation) ([place])    s/p afib ablation 11/17/10  . Periodic limb movement disorder (PLMD)   . Right foot pain    all of foot 1--/2016   Past Surgical History:  Procedure Laterality Date  . atrial fibrillation ablation  11/17/10   PVI by Dr Rayann Heman  . TONSILLECTOMY     Social History   Socioeconomic History  . Marital status: Married    Spouse name: Not on file  . Number of children: 2  . Years of education: Not on file  . Highest education level: Not on file  Social Needs  . Financial resource strain: Not on file  . Food insecurity - worry: Not on file  . Food insecurity - inability: Not on file  . Transportation needs - medical: Not on file  . Transportation needs - non-medical: Not on file  Occupational History  . Occupation: DOCTOR/EYE    Employer: SELF EMPLOYED  Tobacco Use  . Smoking status: Never Smoker  . Smokeless tobacco: Never Used  Substance and Sexual  Activity  . Alcohol use: Yes    Alcohol/week: 4.2 oz    Types: 7 Glasses of wine per week  . Drug use: No  . Sexual activity: Not on file  Other Topics Concern  . 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   Allergies  Allergen Reactions  . Pantoprazole Sodium     REACTION: rash from this or from  topical disinfectant   Family History  Problem Relation Age of Onset  . Peripheral vascular disease Mother        afib in her 102s  . Aplastic anemia Father   . Cancer Unknown   . Prostate cancer Maternal Grandfather   . Leukemia Paternal Grandfather   . Colon cancer Neg Hx      Past medical history, social, surgical and family history all reviewed in electronic medical record.  No pertanent information unless stated regarding to the chief complaint.   Review of Systems:Review of systems updated and as accurate as of 11/23/17  No headache, visual changes, nausea, vomiting, diarrhea, constipation, dizziness, abdominal pain, skin rash, fevers, chills, night sweats, weight loss, swollen lymph nodes, body  aches, joint swelling, muscle aches, chest pain, shortness of breath, mood changes.   Objective  There were no vitals taken for this visit. Systems examined below as of 11/23/17   General: No apparent distress alert and oriented x3 mood and affect normal, dressed appropriately.  HEENT: Pupils equal, extraocular movements intact  Respiratory: Patient's speak in full sentences and does not appear short of breath  Cardiovascular: No lower extremity edema, non tender, no erythema  Skin: Warm dry intact with no signs of infection or rash on extremities or on axial skeleton.  Abdomen: Soft nontender  Neuro: Cranial nerves II through XII are intact, neurovascularly intact in all extremities with 2+ DTRs and 2+ pulses.  Lymph: No lymphadenopathy of posterior or anterior cervical chain or axillae bilaterally.    Gait normal with good balance and coordination.  MSK:  Non tender with full range of motion and good stability and symmetric strength and tone of  elbows, wrist, hip, knee and ankles bilaterally.  Shoulder: Left Inspection reveals no abnormalities, atrophy or asymmetry. Palpation is normal with no tenderness over AC joint or bicipital groove. ROM is full in all planes. Rotator cuff strength normal throughout. Positive impingement Speeds and Yergason's tests normal. Mild positive O'Brien's Normal scapular function observed. No painful arc and no drop arm sign. No apprehension sign Contralateral shoulder unremarkable  Neck: Inspection loss of lordosis. No palpable stepoffs. Negative Spurling's maneuver. Loss of range of motion in all planes Grip strength and sensation normal in bilateral hands Mild weakness noted in the C6 distribution Negative Hoffman sign bilaterally Reflexes normal  Left hand exam shows the patient does have some thenar eminence wasting noted.  Positive grind test of the [facility] joint.  Limited musculoskeletal ultrasound was performed and interpreted by Lyndal Pulley  Limited ultrasound of the [facility] joint no significant osteoarthritic changes.  Hypoechoic changes noted as well.  Procedure: Real-time Ultrasound Guided Injection of left CMC joint Device: GE Logiq Q7 Ultrasound guided injection is preferred based studies that show increased duration, increased effect, greater accuracy, decreased procedural pain, increased response rate, and decreased cost with ultrasound guided versus blind injection.  Verbal informed consent obtained.  Time-out conducted.  Noted no overlying erythema, induration, or other signs of local infection.  Skin prepped in a sterile fashion.  Local anesthesia: Topical Ethyl chloride.  With sterile technique and under real time ultrasound guidance: CMC joint was injected with a total of 0.5 cc of 0.5% Marcaine and 0.5 cc of Kenalog 40 mg/dL  with a 25-gauge 1 inch needle. Completed without difficulty  Pain immediately resolved suggesting accurate placement of the medication.  Advised to call if fevers/chills, erythema, induration, drainage, or persistent bleeding.  Images permanently stored and available for review in the ultrasound unit.  Impression: Technically successful ultrasound guided injection.     Impression and Recommendations:     This case required medical decision making of moderate complexity.      Note: This dictation was prepared with Dragon dictation along with smaller phrase technology. Any transcriptional errors that result from this process are unintentional.

## 2017-11-23 NOTE — Telephone Encounter (Signed)
Copied from [place] [id number]. Topic: General - Other >> Nov 23, 2017  4:59 PM Cecelia Byars, NT wrote: Reason for CRM: [facility] in [place] called and said the pennsaid is not covered and can something else be ordered

## 2017-11-23 NOTE — Telephone Encounter (Signed)
Copied from [place] [id number]. Topic: General - Other >> Nov 23, 2017  4:59 PM Cecelia Byars, NT wrote: Reason for CRM: [facility] in [place] called and said the pennsaid is not covered and can something else be ordered

## 2017-11-23 NOTE — Assessment & Plan Note (Signed)
Cervical radiculopathy patient given gabapentin.  Warned of potential side effects.  Will get laboratory workup secondary to the thenar eminence wasting and see if anything else is contributing to it.  We discussed which activities of doing which wants to avoid.  Follow-up again in 3-4 weeks

## 2017-11-23 NOTE — Patient Instructions (Signed)
Good to see you  Alvera Singh is your friend. Ice 20 minutes 2 times daily. Usually after activity and before bed. pennsaid pinkie amount topically 2 times daily as needed.   Labs and xray downstairs.  Exercises 3 times a week.  Injected the thumb today to help with the arthritis  Gabapentin 200mg  at night in case it is a nerve See me again in 3-4 weeks.

## 2017-11-23 NOTE — Telephone Encounter (Deleted)
Copied from [place] [id number]. Topic: General - Other >> Nov 23, 2017  4:59 PM Cecelia Byars, NT wrote: Reason for CRM: [facility] in [place] called and said the pennsaid is not covered and can something else be ordered

## 2017-11-23 NOTE — Assessment & Plan Note (Signed)
Patient was given an injection.  Given topical anti-inflammatories.  Discussed icing regimen and home exercises.  Discussed which activities to do which wants to avoid.  Follow-up again in 4 weeks.

## 2017-11-27 LAB — VITAMIN D 1,25 DIHYDROXY
VITAMIN D 1, 25 (OH) TOTAL: 44 pg/mL (ref 18–72)
VITAMIN D3 1, 25 (OH): 44 pg/mL
Vitamin D2 1, 25 (OH)2: <8 pg/mL

## 2017-11-27 LAB — PTH, INTACT AND CALCIUM
CALCIUM: 9.7 mg/dL (ref 8.6–10.3)
PTH: 53 pg/mL (ref 14–64)

## 2017-11-27 NOTE — Telephone Encounter (Signed)
Patient returned my call in regards to rx and labs. Gave me permission to leave voicemail. Let him know that labs were normal and that pennsaid samples can be requested as his insurance will not cover a prescription.

## 2017-12-12 ENCOUNTER — Telehealth: Payer: Self-pay | Admitting: Internal Medicine

## 2017-12-12 NOTE — Telephone Encounter (Signed)
Pt called with a questions about his lab work about his medication.  Does he need to do anything?

## 2017-12-12 NOTE — Telephone Encounter (Signed)
Tried to reach patient in regards to a Lipid Panel that needs to be done this month.    The lab was ordered, but no appointment made.  We are looking for an 8 week period from 12/3.

## 2017-12-12 NOTE — Telephone Encounter (Signed)
Lpmtcb 2/6  md

## 2017-12-14 ENCOUNTER — Other Ambulatory Visit: Payer: PPO

## 2017-12-14 DIAGNOSIS — L570 Actinic keratosis: Secondary | ICD-10-CM | POA: Diagnosis not present

## 2017-12-14 DIAGNOSIS — D692 Other nonthrombocytopenic purpura: Secondary | ICD-10-CM | POA: Diagnosis not present

## 2017-12-14 DIAGNOSIS — D1801 Hemangioma of skin and subcutaneous tissue: Secondary | ICD-10-CM | POA: Diagnosis not present

## 2017-12-14 DIAGNOSIS — D2272 Melanocytic nevi of left lower limb, including hip: Secondary | ICD-10-CM | POA: Diagnosis not present

## 2017-12-14 DIAGNOSIS — D225 Melanocytic nevi of trunk: Secondary | ICD-10-CM | POA: Diagnosis not present

## 2017-12-14 DIAGNOSIS — L821 Other seborrheic keratosis: Secondary | ICD-10-CM | POA: Diagnosis not present

## 2017-12-20 ENCOUNTER — Other Ambulatory Visit: Payer: PPO | Admitting: *Deleted

## 2017-12-20 DIAGNOSIS — E785 Hyperlipidemia, unspecified: Secondary | ICD-10-CM | POA: Diagnosis not present

## 2017-12-20 LAB — LIPID PANEL
CHOLESTEROL TOTAL: 185 mg/dL (ref 100–199)
Chol/HDL Ratio: 3.8 ratio (ref 0.0–5.0)
HDL: 49 mg/dL (ref >39)
LDL Calculated: 110 mg/dL — ABNORMAL HIGH (ref 0–99)
Triglycerides: 130 mg/dL (ref 0–149)
VLDL Cholesterol Cal: 26 mg/dL (ref 5–40)

## 2017-12-20 NOTE — Progress Notes (Signed)
[facility] [place], [place] [address] Phone: [phone] Subjective:      CC: Neck pain, thumb pain follow-up  [id number]  Randy Cantu is a 67 y.o. male coming in with complaint of neck pain.  Was having cervical radiculopathy with C6 weakness.  Patient did have x-rays done November 23, 2017.  Showed that there was moderate to severe disc space narrowing from C5 through C7.  Also had a slight retrolisthesis at C6-C7. Patient notes that he has been having headaches daily since his last visit. He wonders if the gabapentin or the pennsaid are contributing.   Laboratory workup was unremarkable.  Found to also have [place] arthritis.  Patient was given an injection November 23, 2017.  Patient states that the 4th finger is still swollen as he isn't able to wear his wedding band. He also states that his thenar eminence is painful but if he pushes on those muscles that he his able to increase his range of motion and decreases pain. He has pain with writing but is able to use his hand at the gym without pain.     Past Medical History:  Diagnosis Date  . Allergic rhinitis   . HLD (hyperlipidemia)   . Lactose intolerance   . PAF (paroxysmal atrial fibrillation) ([place])    s/p afib ablation 11/17/10  . Periodic limb movement disorder (PLMD)   . Right foot pain    all of foot 1--/2016   Past Surgical History:  Procedure Laterality Date  . atrial fibrillation ablation  11/17/10   PVI by Dr Rayann Heman  . TONSILLECTOMY     Social History   Socioeconomic History  . Marital status: Married    Spouse name: None  . Number of children: 2  . Years of education: None  . Highest education level: None  Social Needs  . Financial resource strain: None  . Food insecurity - worry: None  . Food insecurity - inability: None  . Transportation needs - medical: None  . Transportation needs - non-medical: None  Occupational History  . Occupation: DOCTOR/EYE   Employer: SELF EMPLOYED  Tobacco Use  . Smoking status: Never Smoker  . Smokeless tobacco: Never Used  Substance and Sexual Activity  . Alcohol use: Yes    Alcohol/week: 4.2 oz    Types: 7 Glasses of wine per week  . Drug use: No  . Sexual activity: None  Other Topics Concern  . 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   Allergies  Allergen Reactions  . Pantoprazole Sodium     REACTION: rash from this or from  topical disinfectant   Family History  Problem Relation Age of Onset  . Peripheral vascular disease Mother        afib in her 29s  . Aplastic anemia Father   . Cancer Unknown   . Prostate cancer Maternal Grandfather   . Leukemia Paternal Grandfather   . Colon cancer Neg Hx      Past medical history, social, surgical and family history all reviewed in electronic medical record.  No pertanent information unless stated regarding to the chief complaint.   Review of Systems:Review of systems updated and as accurate as of 12/21/17  No headache, visual changes, nausea, vomiting, diarrhea, constipation, dizziness, abdominal pain, skin rash, fevers, chills, night sweats, weight loss, swollen lymph nodes, body aches, joint swelling, muscle aches,  chest pain, shortness of breath, mood changes.   Objective  Blood pressure 118/74, pulse 66, height 6\' 1"  (1.854 m), weight 224 lb (101.6 kg), SpO2 98 %. Systems examined below as of 12/21/17   General: No apparent distress alert and oriented x3 mood and affect normal, dressed appropriately.  HEENT: Pupils equal, extraocular movements intact  Respiratory: Patient's speak in full sentences and does not appear short of breath  Cardiovascular: No lower extremity edema, non tender, no erythema  Skin: Warm dry intact with no signs of infection or rash on extremities or on axial skeleton.  Abdomen: Soft nontender  Neuro: Cranial nerves II through XII are intact,  neurovascularly intact in all extremities with 2+ DTRs and 2+ pulses.  Lymph: No lymphadenopathy of posterior or anterior cervical chain or axillae bilaterally.  Gait normal with good balance and coordination.  MSK:  Non tender with full range of motion and good stability and symmetric strength and tone of shoulders, elbows, wrist, hip, knee and ankles bilaterally.  Neck: Inspection mild loss of lordosis.  Patient does have some mild limitation with right-sided rotation.. No palpable stepoffs. Negative Spurling's maneuver. Full neck range of motion Grip strength and sensation normal in bilateral hands Strength good C4 to T1 distribution No sensory change to C4 to T1 Negative Hoffman sign bilaterally Reflexes normal Mild tightness of the right trapezius  Osteopathic findings C2 flexed rotated and side bent right C4 flexed rotated and side bent left C6 flexed rotated and side bent right  T3 extended rotated and side bent right inhaled third rib     Impression and Recommendations:     This case required medical decision making of moderate complexity.      Note: This dictation was prepared with Dragon dictation along with smaller phrase technology. Any transcriptional errors that result from this process are unintentional.

## 2017-12-21 ENCOUNTER — Encounter: Payer: Self-pay | Admitting: Family Medicine

## 2017-12-21 ENCOUNTER — Ambulatory Visit (INDEPENDENT_AMBULATORY_CARE_PROVIDER_SITE_OTHER): Payer: PPO | Admitting: Family Medicine

## 2017-12-21 ENCOUNTER — Other Ambulatory Visit: Payer: Self-pay | Admitting: Family Medicine

## 2017-12-21 ENCOUNTER — Ambulatory Visit: Payer: PPO | Admitting: Family Medicine

## 2017-12-21 DIAGNOSIS — M999 Biomechanical lesion, unspecified: Secondary | ICD-10-CM | POA: Insufficient documentation

## 2017-12-21 DIAGNOSIS — M5412 Radiculopathy, cervical region: Secondary | ICD-10-CM

## 2017-12-21 DIAGNOSIS — M1812 Unilateral primary osteoarthritis of first carpometacarpal joint, left hand: Secondary | ICD-10-CM | POA: Diagnosis not present

## 2017-12-21 MED ORDER — VENLAFAXINE HCL ER 37.5 MG PO CP24: 37.5000 mg | ORAL_CAPSULE | Freq: Every day | ORAL | 1 refills | Status: DC

## 2017-12-21 NOTE — Assessment & Plan Note (Signed)
Decision today to treat with OMT was based on Physical Exam  After verbal consent patient was treated with HVLA, ME, FPR techniques in cervical, thoracic, rib areas  Patient tolerated the procedure well with improvement in symptoms  Patient given exercises, stretches and lifestyle modifications  See medications in patient instructions if given  Patient will follow up in 4-6 weeks 

## 2017-12-21 NOTE — Assessment & Plan Note (Signed)
Improved after the injection but still having some discomfort and pain.  We will continue to monitor.  Can repeat every 10 weeks.

## 2017-12-21 NOTE — Telephone Encounter (Signed)
Refill done.  

## 2017-12-21 NOTE — Patient Instructions (Addendum)
Good to see you  Ice is your friend still  Effexor 37.5 mg daily.  Stop the gabapentin and the pennsaid.  Lets see if headaches improve Continue the exercises Lets watch the thumb but can repeat in 12 weeks  [email or web address] for your son See me again  n4-6 weeks

## 2017-12-21 NOTE — Assessment & Plan Note (Signed)
Patient's weakness is significantly improved at this time.  Patient feels that the gabapentin could be causing headaches.  Will transition to Effexor.  Warned of potential side effects but patient has been on this medication previously.  Discussed icing regimen and home exercises.  Patient will follow up with me again 4 weeks.

## 2018-01-03 ENCOUNTER — Telehealth: Payer: Self-pay | Admitting: Internal Medicine

## 2018-01-03 NOTE — Telephone Encounter (Signed)
Left pt a messages : per Dr Caryl Comes that MD will contact pt to discuss what medication he can take.

## 2018-01-03 NOTE — Telephone Encounter (Signed)
New Message   Patient is calling reference to his labs. Please call to discuss. He states that a detailed voice message can be left.

## 2018-01-10 ENCOUNTER — Telehealth: Payer: Self-pay | Admitting: Internal Medicine

## 2018-01-10 NOTE — Telephone Encounter (Signed)
Returned patients call. He had questions regarding his last cholesterol lab results. He stated he had started himself back on Pravastatin 40mg  over a month ago and wanted to wait to begin Crestor until after he went out of town. I told him I thought that would be fine.  He also discussed he Afib history and he had been out of rhythm recently. He stated he has not been taking Eliquis and asked if he could take it when he felt himself out of rhythm. He asked about "keeping an Eliquis in his pocket for when he feels himself out of rhythm." I responded saying I did not think that would be therapeutic enough to protect him from stroke, but I would clarify with Dr Caryl Comes.  He had no additional questions or concerns.

## 2018-01-10 NOTE — Telephone Encounter (Signed)
New message    Patient calling for lipid panel results. Please call

## 2018-01-16 NOTE — Telephone Encounter (Signed)
See additional note for documentation regarding this call.

## 2018-01-30 DIAGNOSIS — M9901 Segmental and somatic dysfunction of cervical region: Secondary | ICD-10-CM | POA: Diagnosis not present

## 2018-01-30 DIAGNOSIS — S138XXA Sprain of joints and ligaments of other parts of neck, initial encounter: Secondary | ICD-10-CM | POA: Diagnosis not present

## 2018-01-30 DIAGNOSIS — S29012A Strain of muscle and tendon of back wall of thorax, initial encounter: Secondary | ICD-10-CM | POA: Diagnosis not present

## 2018-01-30 DIAGNOSIS — M9902 Segmental and somatic dysfunction of thoracic region: Secondary | ICD-10-CM | POA: Diagnosis not present

## 2018-02-01 DIAGNOSIS — M9901 Segmental and somatic dysfunction of cervical region: Secondary | ICD-10-CM | POA: Diagnosis not present

## 2018-02-01 DIAGNOSIS — S138XXA Sprain of joints and ligaments of other parts of neck, initial encounter: Secondary | ICD-10-CM | POA: Diagnosis not present

## 2018-02-01 DIAGNOSIS — M9902 Segmental and somatic dysfunction of thoracic region: Secondary | ICD-10-CM | POA: Diagnosis not present

## 2018-02-01 DIAGNOSIS — S29012A Strain of muscle and tendon of back wall of thorax, initial encounter: Secondary | ICD-10-CM | POA: Diagnosis not present

## 2018-02-07 DIAGNOSIS — S29012A Strain of muscle and tendon of back wall of thorax, initial encounter: Secondary | ICD-10-CM | POA: Diagnosis not present

## 2018-02-07 DIAGNOSIS — M9901 Segmental and somatic dysfunction of cervical region: Secondary | ICD-10-CM | POA: Diagnosis not present

## 2018-02-07 DIAGNOSIS — M9902 Segmental and somatic dysfunction of thoracic region: Secondary | ICD-10-CM | POA: Diagnosis not present

## 2018-02-07 DIAGNOSIS — S138XXA Sprain of joints and ligaments of other parts of neck, initial encounter: Secondary | ICD-10-CM | POA: Diagnosis not present

## 2018-02-26 NOTE — Telephone Encounter (Signed)
Reached out to the patient and he was ok with al  I will see him in the office as scheduled in June

## 2018-03-06 ENCOUNTER — Other Ambulatory Visit: Payer: Self-pay | Admitting: Internal Medicine

## 2018-03-06 DIAGNOSIS — I480 Paroxysmal atrial fibrillation: Secondary | ICD-10-CM

## 2018-03-06 NOTE — Telephone Encounter (Signed)
Pt is a 67 yr old male who saw Dr Caryl Comes on 10/08/2017. Weight on 12/21/17 was 101.6Kg. Pt due to see Dr Caryl Comes on 04/18/18 will have him do BMET/CBC at that time will refill Eliquis 5mg  BID until that appt.

## 2018-03-20 DIAGNOSIS — N5203 Combined arterial insufficiency and corporo-venous occlusive erectile dysfunction: Secondary | ICD-10-CM | POA: Diagnosis not present

## 2018-03-20 DIAGNOSIS — N400 Enlarged prostate without lower urinary tract symptoms: Secondary | ICD-10-CM | POA: Diagnosis not present

## 2018-03-21 DIAGNOSIS — M9902 Segmental and somatic dysfunction of thoracic region: Secondary | ICD-10-CM | POA: Diagnosis not present

## 2018-03-21 DIAGNOSIS — S29012A Strain of muscle and tendon of back wall of thorax, initial encounter: Secondary | ICD-10-CM | POA: Diagnosis not present

## 2018-03-21 DIAGNOSIS — S138XXA Sprain of joints and ligaments of other parts of neck, initial encounter: Secondary | ICD-10-CM | POA: Diagnosis not present

## 2018-03-21 DIAGNOSIS — M9901 Segmental and somatic dysfunction of cervical region: Secondary | ICD-10-CM | POA: Diagnosis not present

## 2018-03-27 ENCOUNTER — Telehealth: Payer: Self-pay | Admitting: Internal Medicine

## 2018-03-27 DIAGNOSIS — M542 Cervicalgia: Secondary | ICD-10-CM | POA: Diagnosis not present

## 2018-03-27 DIAGNOSIS — R221 Localized swelling, mass and lump, neck: Secondary | ICD-10-CM | POA: Diagnosis not present

## 2018-03-27 NOTE — Telephone Encounter (Signed)
New Message   Patient is calling in reference to his appointment on 04/17/2018. He indicates that Dr. Caryl Comes was trying him on some new cholesterol medication. He needs to know does he need to have labs done prior to appointment. Please call to discuss.

## 2018-03-28 NOTE — Telephone Encounter (Signed)
LVM for return call. 

## 2018-03-29 NOTE — Telephone Encounter (Signed)
I advised pt that Dr Caryl Comes will not need cholesterol checked before his next appt. Pt verbalized understanding and had no additional questions.

## 2018-04-11 ENCOUNTER — Other Ambulatory Visit: Payer: Self-pay | Admitting: Neurology

## 2018-04-11 DIAGNOSIS — G4752 REM sleep behavior disorder: Secondary | ICD-10-CM

## 2018-04-11 DIAGNOSIS — G478 Other sleep disorders: Secondary | ICD-10-CM

## 2018-04-11 NOTE — Telephone Encounter (Signed)
Rx sent electronically by Dr. Brett Fairy

## 2018-04-11 NOTE — Telephone Encounter (Signed)
Rx registry checked. Last fill date 11/25/17 for #[id number]. Last OV 08/21/17, next OV 08/21/18.

## 2018-04-18 ENCOUNTER — Ambulatory Visit: Payer: PPO | Admitting: Internal Medicine

## 2018-04-18 ENCOUNTER — Encounter: Payer: Self-pay | Admitting: Internal Medicine

## 2018-04-18 VITALS — BP 120/76 | HR 56 | Ht 73.0 in | Wt 227.4 lb

## 2018-04-18 DIAGNOSIS — E785 Hyperlipidemia, unspecified: Secondary | ICD-10-CM | POA: Diagnosis not present

## 2018-04-18 DIAGNOSIS — I480 Paroxysmal atrial fibrillation: Secondary | ICD-10-CM

## 2018-04-18 MED ORDER — APIXABAN 5 MG PO TABS: 5.0000 mg | ORAL_TABLET | Freq: Two times a day (BID) | ORAL | 3 refills | Status: DC

## 2018-04-18 NOTE — Progress Notes (Signed)
      Patient Care Team: [name], MD as PCP - General (Family Medicine) [name], MD as Consulting Physician (Cardiology)   HPI  Randy Cantu is a 67 y.o. male Seen in follow-up for atrial fibrillation managed with a combination of verapamil and flecainide.  He is status post PVI 1/12-JA  DATE PR interval QRSduration Flecainide        12/18 208 108 150   He continues to have infrequent atrial fibrillation.  Most episodes are short.  If he fails to take his medications he has more symptoms.   He underwent risk stratification and was started on a statin.  He is tolerating this well.  No problems with exercise tolerance.  No edema or chest pain.  Or shortness of breath.  Records and Results Reviewed  Past Medical History:  Diagnosis Date  . Allergic rhinitis   . HLD (hyperlipidemia)   . Lactose intolerance   . PAF (paroxysmal atrial fibrillation) ([place])    s/p afib ablation 11/17/10  . Periodic limb movement disorder (PLMD)   . Right foot pain    all of foot 1--/2016    Past Surgical History:  Procedure Laterality Date  . atrial fibrillation ablation  11/17/10   PVI by Dr Rayann Heman  . TONSILLECTOMY      Current Outpatient Medications  Medication Sig Dispense Refill  . apixaban (ELIQUIS) 5 MG TABS tablet Take 1 tablet (5 mg total) by mouth 2 (two) times daily. 180 tablet 3  . busPIRone (BUSPAR) 5 MG tablet Take 1 tablet by mouth daily.  11  . clonazePAM (KLONOPIN) 0.5 MG tablet TAKE 1 AND 1/2 TABLETS BY MOUTH AT BEDTIME 135 tablet 0  . Diclofenac Sodium (PENNSAID) 2 % SOLN Place 2 g onto the skin 2 (two) times daily. 112 g 3  . flecainide (TAMBOCOR) 150 MG tablet TAKE 1 TABLET BY MOUTH TWICE DAILY 180 tablet 3  . ranitidine (ZANTAC) 150 MG capsule Take 150 mg by mouth daily as needed for heartburn.    . diltiazem (CARDIZEM CD) 120 MG 24 hr capsule Take 1 capsule (120 mg total) by mouth daily. 90 capsule 3  . rosuvastatin (CRESTOR) 10 MG tablet Take 1  tablet (10 mg total) by mouth daily. 90 tablet 3   No current facility-administered medications for this visit.     Allergies  Allergen Reactions  . Pantoprazole Sodium     REACTION: rash from this or from  topical disinfectant      Review of Systems negative except from HPI and PMH  Physical Exam BP 120/76   Pulse (!) 56   Ht 6\' 1"  (1.854 m)   Wt 227 lb 6.4 oz (103.1 kg)   SpO2 98%   BMI 30.00 kg/m  Well developed and nourished in no acute distress HENT normal Neck supple with JVP-flat Clear Regular rate and rhythm, no murmurs or gallops Abd-soft with active BS No Clubbing cyanosis edema Skin-warm and dry A & Oriented  Grossly normal sensory and motor function   ECG demonstrates sinus @ 56 22/11/46  Assessment and  Plan  Atrial fib paroxysmal  Constipation  Hyperlipidemia  AFib stable  On Anticoagulation;  No bleeding issues   Tolerating statins  Continues current plan

## 2018-04-18 NOTE — Patient Instructions (Signed)
Medication Instructions:  Your physician recommends that you continue on your current medications as directed. Please refer to the Current Medication list given to you today.   Labwork: You will have labs drawn today: CBC and BMP   Testing/Procedures: None ordered.  Follow-Up: Your physician wants you to follow-up in: One Year with Dr Klein.  You will receive a reminder letter in the mail two months in advance. If you don't receive a letter, please call our office to schedule the follow-up appointment.   Any Other Special Instructions Will Be Listed Below (If Applicable).     If you need a refill on your cardiac medications before your next appointment, please call your pharmacy.  

## 2018-04-19 LAB — CBC WITH DIFFERENTIAL/PLATELET
BASOS ABS: 0 10*3/uL (ref 0.0–0.2)
BASOS: 0 %
EOS (ABSOLUTE): 0 10*3/uL (ref 0.0–0.4)
EOS: 0 %
HEMATOCRIT: 43.1 % (ref 37.5–51.0)
HEMOGLOBIN: 15.2 g/dL (ref 13.0–17.7)
IMMATURE GRANS (ABS): 0 10*3/uL (ref 0.0–0.1)
Immature Granulocytes: 1 %
LYMPHS ABS: 1.7 10*3/uL (ref 0.7–3.1)
LYMPHS: 25 %
MCH: 34.1 pg — AB (ref 26.6–33.0)
MCHC: 35.3 g/dL (ref 31.5–35.7)
MCV: 97 fL (ref 79–97)
MONOCYTES: 8 %
Monocytes Absolute: 0.6 10*3/uL (ref 0.1–0.9)
NEUTROS ABS: 4.6 10*3/uL (ref 1.4–7.0)
Neutrophils: 66 %
Platelets: 218 10*3/uL (ref 150–450)
RBC: 4.46 x10E6/uL (ref 4.14–5.80)
RDW: 13.4 % (ref 12.3–15.4)
WBC: 6.9 10*3/uL (ref 3.4–10.8)

## 2018-04-19 LAB — BASIC METABOLIC PANEL
BUN / CREAT RATIO: 12 (ref 10–24)
BUN: 14 mg/dL (ref 8–27)
CALCIUM: 9.7 mg/dL (ref 8.6–10.2)
CO2: 26 mmol/L (ref 20–29)
CREATININE: 1.13 mg/dL (ref 0.76–1.27)
Chloride: 104 mmol/L (ref 96–106)
GFR, EST AFRICAN AMERICAN: 78 mL/min/{1.73_m2} (ref >59)
GFR, EST NON AFRICAN AMERICAN: 67 mL/min/{1.73_m2} (ref >59)
Glucose: 71 mg/dL (ref 65–99)
Potassium: 4.2 mmol/L (ref 3.5–5.2)
Sodium: 142 mmol/L (ref 134–144)

## 2018-04-19 LAB — LDL CHOLESTEROL, DIRECT: LDL Direct: 70 mg/dL (ref 0–99)

## 2018-07-01 ENCOUNTER — Other Ambulatory Visit: Payer: Self-pay | Admitting: Neurology

## 2018-07-01 DIAGNOSIS — G4752 REM sleep behavior disorder: Secondary | ICD-10-CM

## 2018-07-01 DIAGNOSIS — G478 Other sleep disorders: Secondary | ICD-10-CM

## 2018-08-08 ENCOUNTER — Other Ambulatory Visit: Payer: Self-pay | Admitting: Internal Medicine

## 2018-08-08 DIAGNOSIS — E785 Hyperlipidemia, unspecified: Secondary | ICD-10-CM

## 2018-08-21 ENCOUNTER — Ambulatory Visit: Payer: PPO | Admitting: Neurology

## 2018-09-05 DIAGNOSIS — K5900 Constipation, unspecified: Secondary | ICD-10-CM | POA: Diagnosis not present

## 2018-09-05 DIAGNOSIS — Z7189 Other specified counseling: Secondary | ICD-10-CM | POA: Diagnosis not present

## 2018-09-05 DIAGNOSIS — I4891 Unspecified atrial fibrillation: Secondary | ICD-10-CM | POA: Diagnosis not present

## 2018-09-17 ENCOUNTER — Other Ambulatory Visit: Payer: Self-pay | Admitting: Internal Medicine

## 2018-09-29 ENCOUNTER — Other Ambulatory Visit: Payer: Self-pay | Admitting: Internal Medicine

## 2018-10-24 DIAGNOSIS — N289 Disorder of kidney and ureter, unspecified: Secondary | ICD-10-CM | POA: Diagnosis not present

## 2018-11-11 ENCOUNTER — Telehealth: Payer: Self-pay | Admitting: Neurology

## 2018-11-11 NOTE — Telephone Encounter (Signed)
Called the patient to inform that Dr Brett Fairy will be out of the office on 11/21/2018 on date of his upcoming apt. No answer. LVM informing the patient of this and advising the patient call the office to get him rescheduled.   Pt can be seen with Ward Givens, NP or Dr Brett Fairy.

## 2018-11-12 NOTE — Telephone Encounter (Signed)
Pt returning RNs call, scheduled pt for 1/15 with Dr. Brett Fairy

## 2018-11-15 DIAGNOSIS — N5203 Combined arterial insufficiency and corporo-venous occlusive erectile dysfunction: Secondary | ICD-10-CM | POA: Diagnosis not present

## 2018-11-15 DIAGNOSIS — N400 Enlarged prostate without lower urinary tract symptoms: Secondary | ICD-10-CM | POA: Diagnosis not present

## 2018-11-20 ENCOUNTER — Ambulatory Visit (INDEPENDENT_AMBULATORY_CARE_PROVIDER_SITE_OTHER): Payer: PPO | Admitting: Neurology

## 2018-11-20 ENCOUNTER — Encounter: Payer: Self-pay | Admitting: Neurology

## 2018-11-20 ENCOUNTER — Encounter

## 2018-11-20 VITALS — BP 123/78 | HR 58 | Ht 73.0 in | Wt 226.0 lb

## 2018-11-20 DIAGNOSIS — G478 Other sleep disorders: Secondary | ICD-10-CM

## 2018-11-20 DIAGNOSIS — G4752 REM sleep behavior disorder: Secondary | ICD-10-CM

## 2018-11-20 MED ORDER — METRONIDAZOLE 375 MG PO CAPS: 375.0000 mg | ORAL_CAPSULE | Freq: Two times a day (BID) | ORAL | 0 refills | Status: DC

## 2018-11-20 MED ORDER — ALPRAZOLAM 0.5 MG PO TABS: 0.5000 mg | ORAL_TABLET | Freq: Every evening | ORAL | 0 refills | Status: DC | PRN

## 2018-11-20 MED ORDER — CLONAZEPAM 0.5 MG PO TABS: ORAL_TABLET | ORAL | 1 refills | Status: DC

## 2018-11-20 NOTE — Progress Notes (Signed)
[facility]  Provider:  Larey Seat, MD   Referring Provider: [name], MD Primary Care Physician:  [name], MD  Chief Complaint  Patient presents with  . Follow-up    pt alone, rm 11. pt states things are going well. he has a couple questions to discuss today. pt is going on a cruise and he wanted to discuss a med to help with the flight.     HPI: Randy Cantu is a 68 y.o. 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  RV on 11-20-2018, Epworth is 14, FSS is 22 points. Has planned a travel to [place] and will use Xanax on the flight.  No    He is seen here as a revisit  from Dr. Orland Mustard for persistent daytime sleepiness in the absence of apnea. The patient was evaluated in Dec 2013 for EDS, underwent a PSG on 10-17-12 - but had only frequent PLMs , not associated with a significant arousal index. His  AHI was low, (1.7) .  Dr. Sabra Heck endorsed  the Epworth sleepiness score at 16 points during our visit on 11-01-12. He had also reported diplopia intermittent and it was concerned that this may be related to his treatment with flecainide, also known as Tambocor, for his atrial fibrillation. His wife noted that he has been still acting out dreams behavior, but never leaving the bed. He had been prescribed Klonopin which he felt had an allover positive effect-  he may have slept with a deeper or more restorative quality. Unfortunately, his wife felt that he had personality changes and became more of a bruit or impulsive in response to taking the medication.Therefore he discontinued Klonopin. These REM behaviors have still continued, at times he may be a sleep talking sometimes he will cry out or holler loudly . He never leaves his bed , he sometimes thrashing about , often  talking. He seems to produce these behaviors at 3-5 AM, not within the first 90 minutes of sleep.  This seems to be separate form : characterized as jerking upper  movements, his wife has used the term myoclonus, twitching and jerking. The is occurs earlier with the first onset of sleep .  Interval history 12-09-14; Dr. Sabra Heck is seen here today in a regular follow-up he does have REM behavior disorder which is well controlled on Klonopin. He restarted Klonopin at 0.5 mg total at bedtime. The patient also suffers from atrial fibrillation, has not woken up with palpitations shortness of breath or any panic or breathing difficulties. His weight control was achieved with verapamil and flecainide. Headaches occur less than once a month and are really not in that timeframe or occurrence to use a preventive medication. He has not been acting out dreams or been sleep talking or loudly snoring at all since his last visit here. Today is a re-visit with routine refills.  Interval history 12-08-15 Ongoing twitching at night, about once ever moth a REM enactment. Vivid dreams. No parkinsonism. Bought a new mattress and would like to go off medication, now that movements not longer bother his wife.    Interval history from 08/21/2017, I have the pleasure of seeing Randy Cantu today following up on his REM behavior disorder, diagnosed about 5 years ago as well as refilling his Klonopin as needed. He remains with a higher than average daytime sleepiness score at 15 and 16 points out of 24 ( Epworth). He drinks coffee or soda, pulse rate remains in  the 50's.  Review of Systems: Out of a complete 14 system review, the patient complains of only the following symptoms, and all other reviewed systems are negative. How likely are you to doze in the following situations: 0 = not likely, 1 = slight chance, 2 = moderate chance, 3 = high chance  Sitting and Reading?2 Watching Television?1 Sitting inactive in a public place (theater or meeting)?3 Lying down in the afternoon when circumstances permit?2 Sitting and talking to someone?3 Sitting quietly after lunch without  alcohol?1 In a car, while stopped for a few minutes in traffic?1 As a passenger in a car for an hour without a break?2  Total = 15/ 16      Social History   Socioeconomic History  . Marital status: Married    Spouse name: Not on file  . Number of children: 2  . Years of education: Not on file  . Highest education level: Not on file  Occupational History  . Occupation: DOCTOR/EYE    Employer: SELF EMPLOYED  Social Needs  . Financial resource strain: Not on file  . Food insecurity:    Worry: Not on file    Inability: Not on file  . Transportation needs:    Medical: Not on file    Non-medical: Not on file  Tobacco Use  . Smoking status: Never Smoker  . Smokeless tobacco: Never Used  Substance and Sexual Activity  . Alcohol use: Yes    Alcohol/week: 7.0 standard drinks    Types: 7 Glasses of wine per week  . Drug use: No  . Sexual activity: Not on file  Lifestyle  . Physical activity:    Days per week: Not on file    Minutes per session: Not on file  . Stress: Not on file  Relationships  . Social connections:    Talks on phone: Not on file    Gets together: Not on file    Attends religious service: Not on file    Active member of club or organization: Not on file    Attends meetings of clubs or organizations: Not on file    Relationship status: Not on file  . Intimate partner violence:    Fear of current or ex partner: Not on file    Emotionally abused: Not on file    Physically abused: Not on file    Forced sexual activity: Not on file  Other Topics Concern  . 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    Family History  Problem Relation Age of Onset  . Peripheral vascular disease Mother        afib in her 21s  . Aplastic anemia Father   . Cancer Unknown   . Prostate cancer Maternal Grandfather   . Leukemia Paternal Grandfather   . Colon cancer Neg Hx     Past Medical  History:  Diagnosis Date  . Allergic rhinitis   . HLD (hyperlipidemia)   . Lactose intolerance   . PAF (paroxysmal atrial fibrillation) ([place])    s/p afib ablation 11/17/10  . Periodic limb movement disorder (PLMD)   . Right foot pain    all of foot 1--/2016    Past Surgical History:  Procedure Laterality Date  . atrial fibrillation ablation  11/17/10   PVI by Dr Rayann Heman  . TONSILLECTOMY      Current Outpatient Medications  Medication Sig Dispense Refill  .  apixaban (ELIQUIS) 5 MG TABS tablet Take 1 tablet (5 mg total) by mouth 2 (two) times daily. 180 tablet 3  . CARTIA XT 120 MG 24 hr capsule TAKE 1 CAPSULE(120 MG) BY MOUTH DAILY 90 capsule 2  . clonazePAM (KLONOPIN) 0.5 MG tablet TAKE 1 AND 1/2 TABLETS BY MOUTH AT BEDTIME 135 tablet 1  . flecainide (TAMBOCOR) 150 MG tablet TAKE 1 TABLET BY MOUTH TWICE DAILY 180 tablet 3  . rosuvastatin (CRESTOR) 10 MG tablet TAKE 1 TABLET BY MOUTH DAILY 30 tablet 7   No current facility-administered medications for this visit.     Allergies as of 11/20/2018 - Review Complete 11/20/2018  Allergen Reaction Noted  . Pantoprazole sodium  01/04/2011    Vitals: BP 123/78   Pulse (!) 58   Ht 6\' 1"  (1.854 m)   Wt 226 lb (102.5 kg)   BMI 29.82 kg/m  Last Weight:  Wt Readings from Last 1 Encounters:  11/20/18 226 lb (102.5 kg)   Last Height:   Ht Readings from Last 1 Encounters:  11/20/18 6\' 1"  (1.854 m)    Physical exam:  General: The patient is awake, alert and appears not in acute distress. The patient is well groomed. Head: Normocephalic, atraumatic. Neck is supple. Mallampati 2, neck circumference:17 inches  .  Cardiovascular:  Regular rate and rhythm, without  murmurs or carotid bruit, and without distended neck veins. Respiratory: Lungs are clear to auscultation. Skin:  Without evidence of edema, or rash Trunk: has normal posture.  Neurologic exam : The patient is awake and alert, oriented to place and time. Memory subjective   described as intact.  There is a normal attention span & concentration ability. Speech is fluent without dysarthria, dysphonia or aphasia.  Mood and affect are appropriate.  Cranial nerves: Pupils are equal.  Extraocular movements in vertical and horizontal planes intact and without nystagmus.  Hearing to finger rub intact. Facial sensation intact to fine touch.  Facial motor strength is symmetric and tongue and uvula move midline.  Motor exam:   Normal muscle bulk and symmetric normal strength in all extremities. Mild cog -wheeling over each biceps, very mild- unchanged  Coordination- very fine finger tremor in both hands, not PD like. .  Gait and station: Patient walks without assistive device. Deep tendon reflexes: in the upper and lower extremities are symmetric and intact. Babinski deferred.  .  Assessment:  After physical and neurologic examination, review of laboratory studies, imaging, neurophysiology testing and pre-existing records, assessment is  1) REM BD - restarted klonipin. Well controlled on 0.5 mg nightly. 2) PLM disorder with myoclonus. Not qualifying as RLS.  3) EDS- continued, preceded atrial fib and related medication.   Plan:  15 minute RV -Treatment plan and additional workup :  Klonopin refilled , yearly screening EDS/ REM BD visit for PD. Dr Linna Darner   Larey Seat, MD

## 2018-11-21 ENCOUNTER — Ambulatory Visit: Payer: PPO | Admitting: Neurology

## 2018-11-29 ENCOUNTER — Other Ambulatory Visit: Payer: Self-pay | Admitting: Gastroenterology

## 2018-11-29 DIAGNOSIS — R1030 Lower abdominal pain, unspecified: Secondary | ICD-10-CM

## 2018-11-29 DIAGNOSIS — K5900 Constipation, unspecified: Secondary | ICD-10-CM | POA: Diagnosis not present

## 2018-12-02 ENCOUNTER — Ambulatory Visit
Admission: RE | Admit: 2018-12-02 | Discharge: 2018-12-02 | Disposition: A | Discharge status: Home / Self Care | Payer: PPO | Source: Ambulatory Visit | Attending: Gastroenterology | Admitting: Gastroenterology

## 2018-12-02 DIAGNOSIS — K449 Diaphragmatic hernia without obstruction or gangrene: Secondary | ICD-10-CM | POA: Diagnosis not present

## 2018-12-02 DIAGNOSIS — N200 Calculus of kidney: Secondary | ICD-10-CM | POA: Diagnosis not present

## 2018-12-02 DIAGNOSIS — R1030 Lower abdominal pain, unspecified: Secondary | ICD-10-CM

## 2018-12-02 MED ORDER — IOPAMIDOL (ISOVUE-300) INJECTION 61%
100.0000 mL | Freq: Once | INTRAVENOUS | Status: AC | PRN
  Administered 2018-12-02: 100 mL via INTRAVENOUS

## 2018-12-17 IMAGING — DX DG CERVICAL SPINE COMPLETE 4+V
6 series · 6 of 6 positions shown · non-contrast
Comparison: None.

CLINICAL DATA: Chronic neck pain.  Left thumb pain.

EXAM:
CERVICAL SPINE - COMPLETE 4+ VIEW

[c-spine lat]
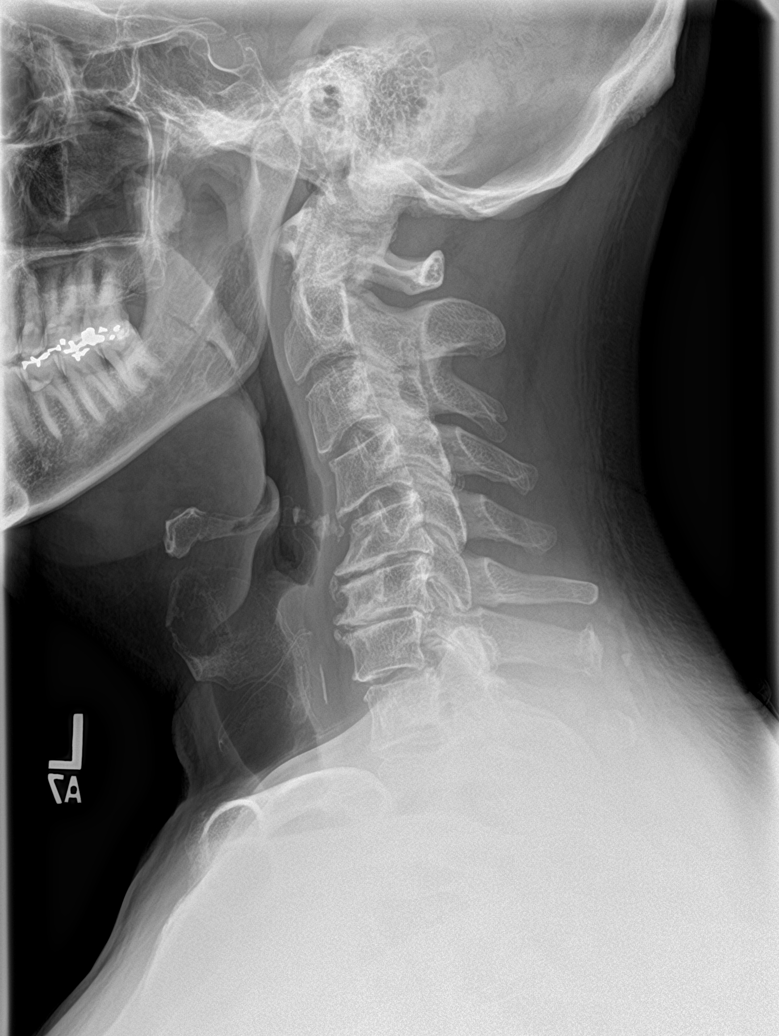

[c-spine obl (1 of 2)]
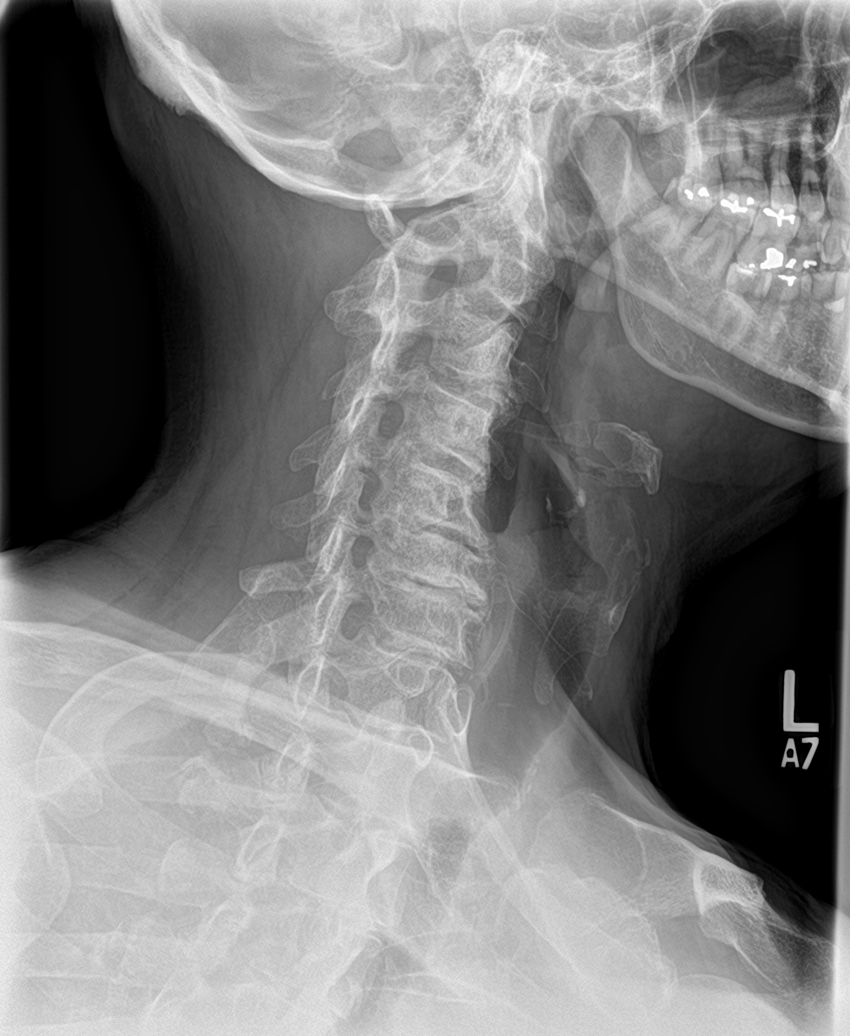

[c-spine obl (2 of 2)]
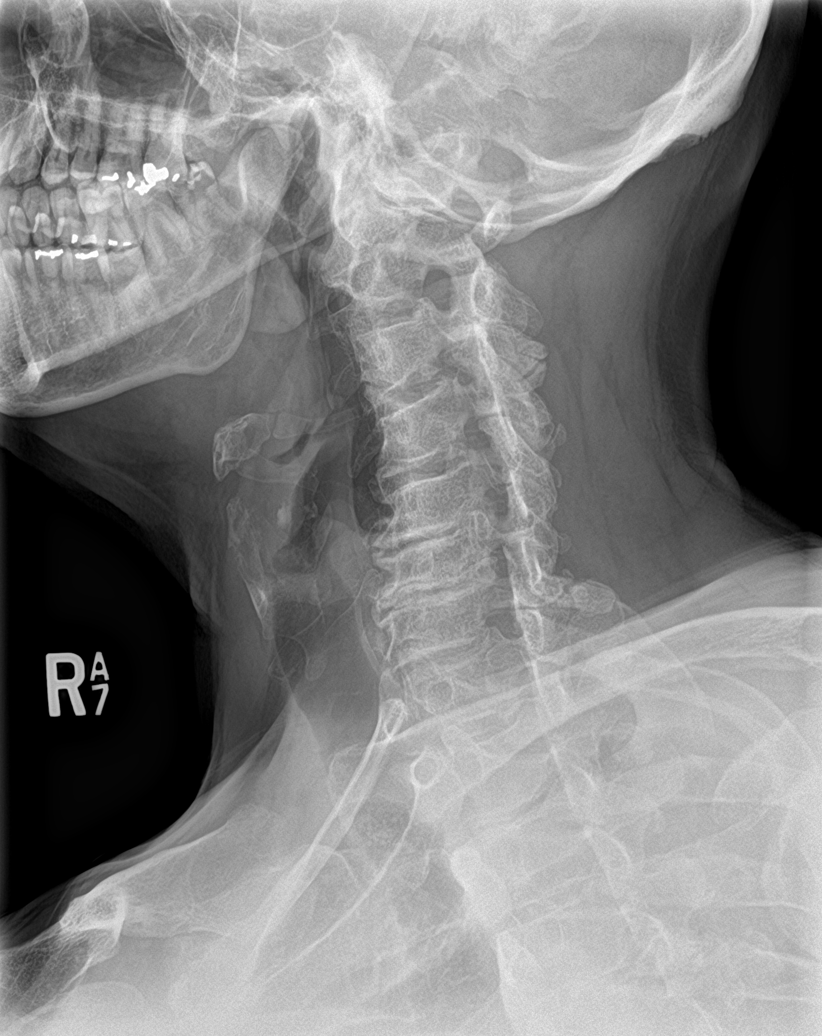

[c-spine ap]
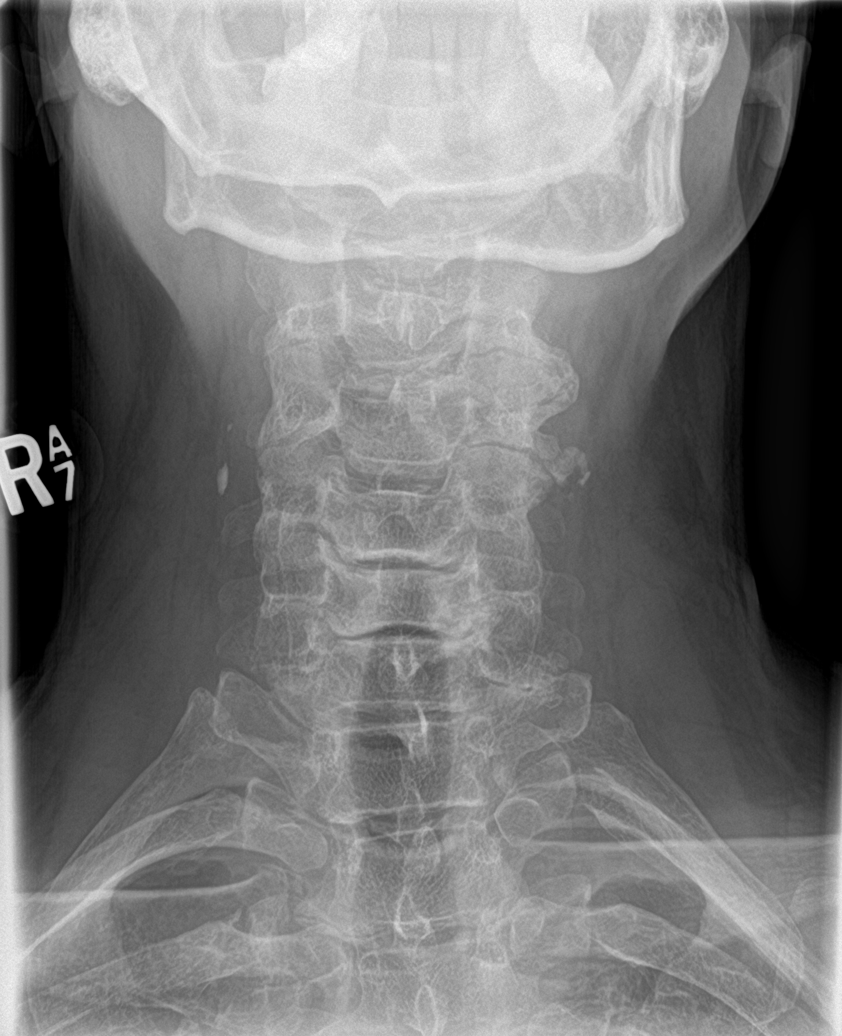

[c-spine open mouth]
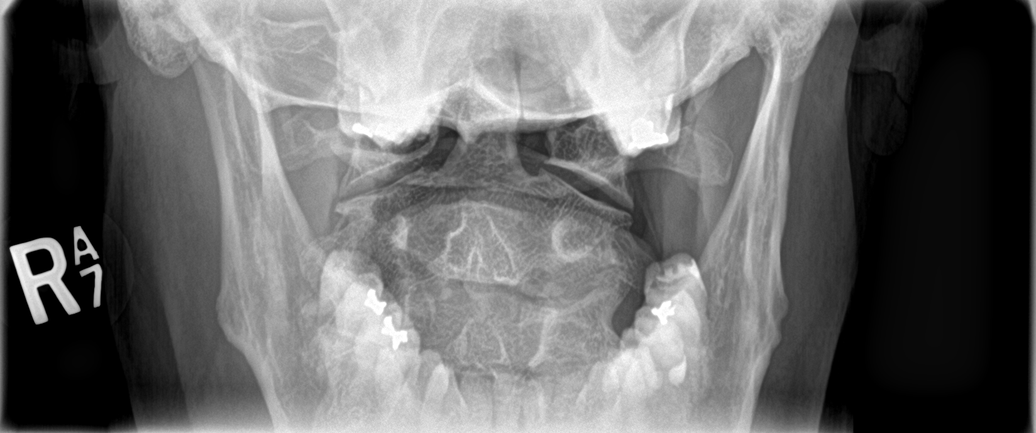

[swimmer]
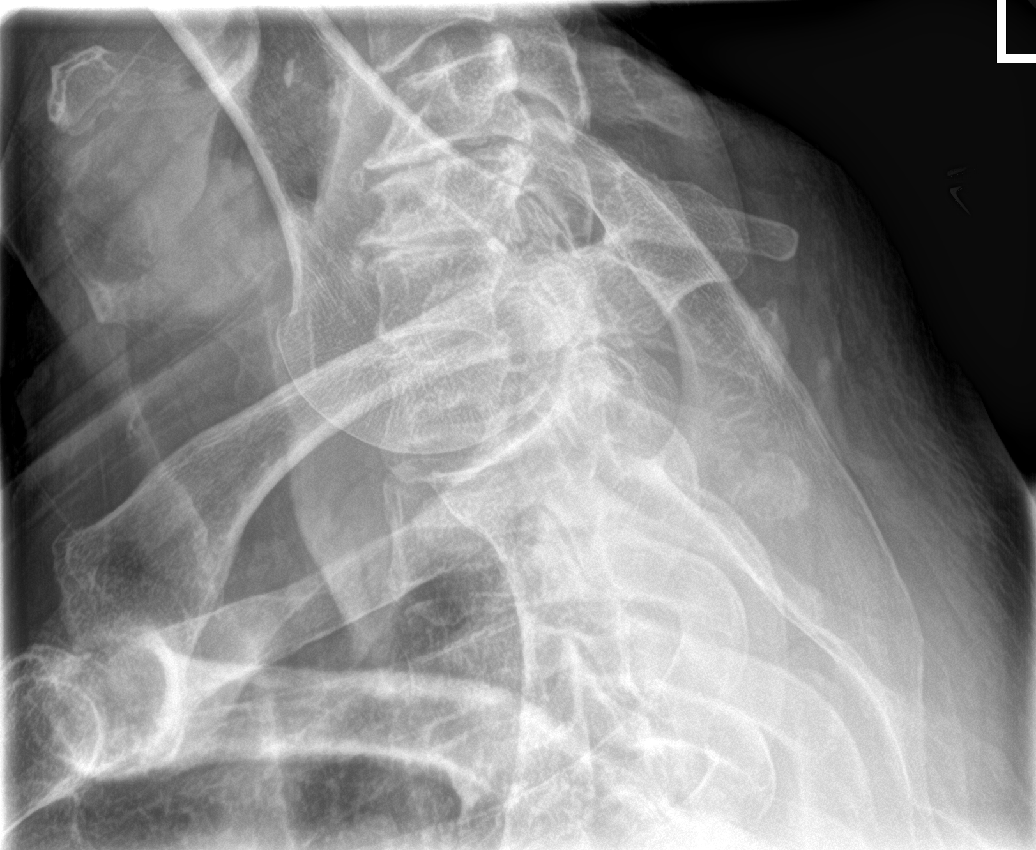

[6 of 6 positions shown; findings below may reference images not displayed]

FINDINGS: There reversal of the cervical lordosis. There is disc space
narrowing at C5-6 and C6-7. Slight retrolisthesis at C6-7 and slight
anterolisthesis at C7-T1.

Left foraminal stenosis at C3-4 due to facet arthritis. Slight left
foraminal stenosis at C6-7.

Prevertebral soft tissues are normal.  Facet arthritis at C7-T1.
IMPRESSION: No acute abnormality. Degenerative disc and joint disease in the
cervical spine as described.

## 2018-12-31 NOTE — Progress Notes (Signed)
[facility] [address], [place] [address] Phone: [phone] Subjective:    CC: Right knee pain  EUM:[id number]  Randy Cantu is a 68 y.o. male coming in with complaint of right knee pain. Patient has inflammation over tibial tuberosity for past 6 months. Patient landed on wooden step and this caused the inflammation. Knelt down to put air in his tires and irritated that area again 2 days ago. Is planning on going skiing on Friday.   Also having pain over the medial patella.  Patient did fall initially.  Did have some pain initially but now only hurts when he puts pressure on it.  Denies any fevers chills or any abnormal weight loss.      Past Medical History:  Diagnosis Date  . Allergic rhinitis   . HLD (hyperlipidemia)   . Lactose intolerance   . PAF (paroxysmal atrial fibrillation) ([place])    s/p afib ablation 11/17/10  . Periodic limb movement disorder (PLMD)   . Right foot pain    all of foot 1--/2016   Past Surgical History:  Procedure Laterality Date  . atrial fibrillation ablation  11/17/10   PVI by Dr Rayann Heman  . TONSILLECTOMY     Social History   Socioeconomic History  . Marital status: Married    Spouse name: Not on file  . Number of children: 2  . Years of education: Not on file  . Highest education level: Not on file  Occupational History  . Occupation: DOCTOR/EYE    Employer: SELF EMPLOYED  Social Needs  . Financial resource strain: Not on file  . Food insecurity:    Worry: Not on file    Inability: Not on file  . Transportation needs:    Medical: Not on file    Non-medical: Not on file  Tobacco Use  . Smoking status: Never Smoker  . Smokeless tobacco: Never Used  Substance and Sexual Activity  . Alcohol use: Yes    Alcohol/week: 7.0 standard drinks    Types: 7 Glasses of wine per week  . Drug use: No  . Sexual activity: Not on file  Lifestyle  . Physical activity:    Days per week: Not on file   Minutes per session: Not on file  . Stress: Not on file  Relationships  . Social connections:    Talks on phone: Not on file    Gets together: Not on file    Attends religious service: Not on file    Active member of club or organization: Not on file    Attends meetings of clubs or organizations: Not on file    Relationship status: Not on file  Other Topics Concern  . 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   Allergies  Allergen Reactions  . Pantoprazole Sodium     REACTION: rash from this or from  topical disinfectant   Family History  Problem Relation Age of Onset  . Peripheral vascular disease Mother        afib in her 21s  . Aplastic anemia Father   . Cancer Unknown   . Prostate cancer Maternal Grandfather   . Leukemia Paternal Grandfather   . Colon cancer Neg Hx      Current Outpatient Medications (Cardiovascular):  Marland Kitchen  CARTIA XT 120 MG 24 hr capsule, TAKE 1 CAPSULE(120 MG) BY MOUTH DAILY .  flecainide (TAMBOCOR) 150 MG tablet, TAKE 1 TABLET BY MOUTH TWICE DAILY .  rosuvastatin (CRESTOR) 10 MG tablet, TAKE 1 TABLET BY MOUTH DAILY    Current Outpatient Medications (Hematological):  .  apixaban (ELIQUIS) 5 MG TABS tablet, Take 1 tablet (5 mg total) by mouth 2 (two) times daily.  Current Outpatient Medications (Other):  Marland Kitchen  ALPRAZolam (XANAX) 0.5 MG tablet, Take 1 tablet (0.5 mg total) by mouth at bedtime as needed for anxiety. .  clonazePAM (KLONOPIN) 0.5 MG tablet, At night po 1.5 tab .  metronidazole (FLAGYL) 375 MG capsule, Take 1 capsule (375 mg total) by mouth 2 (two) times daily. .  Vitamin D, Ergocalciferol, (DRISDOL) 1.25 MG (50000 UT) CAPS capsule, Take 1 capsule (50,000 Units total) by mouth every 7 (seven) days.    Past medical history, social, surgical and family history all reviewed in electronic medical record.  No pertanent information unless stated regarding to the  chief complaint.   Review of Systems:  No headache, visual changes, nausea, vomiting, diarrhea, constipation, dizziness, abdominal pain, skin rash, fevers, chills, night sweats, weight loss, swollen lymph nodes, body aches, joint swelling,  chest pain, shortness of breath, mood changes.  Positive muscle aches  Objective  Blood pressure 122/64, pulse (!) 53, height 6\' 1"  (1.854 m), weight 226 lb (102.5 kg), SpO2 96 %.   General: No apparent distress alert and oriented x3 mood and affect normal, dressed appropriately.  HEENT: Pupils equal, extraocular movements intact  Respiratory: Patient's speak in full sentences and does not appear short of breath  Cardiovascular: No lower extremity edema, non tender, no erythema  Skin: Warm dry intact with no signs of infection or rash on extremities or on axial skeleton.  Abdomen: Soft nontender  Neuro: Cranial nerves II through XII are intact, neurovascularly intact in all extremities with 2+ DTRs and 2+ pulses.  Lymph: No lymphadenopathy of posterior or anterior cervical chain or axillae bilaterally.  Gait normal with good balance and coordination.  MSK:  Non tender with full range of motion and good stability and symmetric strength and tone of shoulders, elbows, wrist, hip and ankles bilaterally.  Knee: Right Normal patient does have swelling over the tibial tuberosity area.  Nontender though really to examine.  Patient does have full extension full flexion.  Some mild warmness in the area.  Medial joint space possible mild narrowing.  Mild crepitus with the patellofemoral but negative grinding test. Contralateral knee unremarkable . MSK US performed of: right knee  This study was ordered, performed, and interpreted by Charlann Boxer D.O.  Knee: Limited ultrasound of patient's knee shows that there is some mild to moderate narrowing of the medial compartment.  Trace effusion of the knee joint noted with mild synovitis.  Patient also has what appears to be  a calcific bursitis superficial to the patella tendon but no true tearing of the tendon noted.  Some mild abnormality of the tibial plateau noted but no true defect.  IMPRESSION: Calcific superficial bursitis of the patella tendon.  Also mild to moderate narrowing of the medial compartment    Impression and Recommendations:     This case required medical decision making of moderate complexity. The above documentation has been reviewed and is accurate and complete Lyndal Pulley, DO       Note: This dictation was prepared with Dragon dictation along with smaller phrase technology. Any transcriptional errors that result from this process are unintentional.

## 2019-01-01 ENCOUNTER — Encounter: Payer: Self-pay | Admitting: Family Medicine

## 2019-01-01 ENCOUNTER — Other Ambulatory Visit (INDEPENDENT_AMBULATORY_CARE_PROVIDER_SITE_OTHER): Payer: PPO

## 2019-01-01 ENCOUNTER — Ambulatory Visit: Payer: PPO | Admitting: Family Medicine

## 2019-01-01 ENCOUNTER — Ambulatory Visit: Payer: Self-pay

## 2019-01-01 ENCOUNTER — Ambulatory Visit (INDEPENDENT_AMBULATORY_CARE_PROVIDER_SITE_OTHER)
Admission: RE | Admit: 2019-01-01 | Discharge: 2019-01-01 | Disposition: A | Discharge status: Home / Self Care | Payer: PPO | Source: Ambulatory Visit | Attending: Family Medicine | Admitting: Family Medicine

## 2019-01-01 VITALS — BP 122/64 | HR 53 | Ht 73.0 in | Wt 226.0 lb

## 2019-01-01 DIAGNOSIS — M25561 Pain in right knee: Principal | ICD-10-CM

## 2019-01-01 DIAGNOSIS — G8929 Other chronic pain: Secondary | ICD-10-CM | POA: Diagnosis not present

## 2019-01-01 DIAGNOSIS — M2550 Pain in unspecified joint: Secondary | ICD-10-CM | POA: Diagnosis not present

## 2019-01-01 DIAGNOSIS — M25461 Effusion, right knee: Secondary | ICD-10-CM | POA: Diagnosis not present

## 2019-01-01 LAB — CBC WITH DIFFERENTIAL/PLATELET
Basophils Absolute: 0.1 10*3/uL (ref 0.0–0.1)
Basophils Relative: 0.9 % (ref 0.0–3.0)
Eosinophils Absolute: 0 10*3/uL (ref 0.0–0.7)
Eosinophils Relative: 0.5 % (ref 0.0–5.0)
HCT: 46.4 % (ref 39.0–52.0)
Hemoglobin: 16 g/dL (ref 13.0–17.0)
Lymphocytes Relative: 22.2 % (ref 12.0–46.0)
Lymphs Abs: 1.6 10*3/uL (ref 0.7–4.0)
MCHC: 34.5 g/dL (ref 30.0–36.0)
MCV: 96.3 fl (ref 78.0–100.0)
Monocytes Absolute: 0.7 10*3/uL (ref 0.1–1.0)
Monocytes Relative: 10.3 % (ref 3.0–12.0)
Neutro Abs: 4.8 10*3/uL (ref 1.4–7.7)
Neutrophils Relative %: 66.1 % (ref 43.0–77.0)
PLATELETS: 216 10*3/uL (ref 150.0–400.0)
RBC: 4.82 Mil/uL (ref 4.22–5.81)
RDW: 12.6 % (ref 11.5–15.5)
WBC: 7.3 10*3/uL (ref 4.0–10.5)

## 2019-01-01 LAB — SEDIMENTATION RATE: Sed Rate: 2 mm/hr (ref 0–20)

## 2019-01-01 LAB — URIC ACID: Uric Acid, Serum: 6.7 mg/dL (ref 4.0–7.8)

## 2019-01-01 MED ORDER — VITAMIN D (ERGOCALCIFEROL) 1.25 MG (50000 UNIT) PO CAPS: 50000.0000 [IU] | ORAL_CAPSULE | ORAL | 0 refills | Status: DC

## 2019-01-01 NOTE — Assessment & Plan Note (Signed)
Right knee pain.  Discussed with patient about calcific changes.  We discussed that the tibial area this is likely from the injury.  Possible gout.  Labs ordered.  Discussed avoiding direct impact in the area but warm compresses.  Follow-up again in 4 weeks

## 2019-01-01 NOTE — Patient Instructions (Addendum)
Good to see you  Labs and xray downstairs  Exercises 3 times a week.  pennsaid pinkie amount topically 2 times daily as needed.   Heat 20 minutes at least daily  Once weekly vitamin D for 12 weeks Over the counter get  Tart cherry extract any dose  Thigh compression with biking or try not to fully extend knee as well See me again in 3-6 weeks

## 2019-01-10 DIAGNOSIS — S022XXA Fracture of nasal bones, initial encounter for closed fracture: Secondary | ICD-10-CM | POA: Diagnosis not present

## 2019-01-10 DIAGNOSIS — J342 Deviated nasal septum: Secondary | ICD-10-CM | POA: Diagnosis not present

## 2019-01-20 ENCOUNTER — Other Ambulatory Visit: Payer: Self-pay | Admitting: Internal Medicine

## 2019-01-20 DIAGNOSIS — E785 Hyperlipidemia, unspecified: Secondary | ICD-10-CM

## 2019-01-21 DIAGNOSIS — S39012A Strain of muscle, fascia and tendon of lower back, initial encounter: Secondary | ICD-10-CM | POA: Diagnosis not present

## 2019-01-21 DIAGNOSIS — M9902 Segmental and somatic dysfunction of thoracic region: Secondary | ICD-10-CM | POA: Diagnosis not present

## 2019-01-21 DIAGNOSIS — M9903 Segmental and somatic dysfunction of lumbar region: Secondary | ICD-10-CM | POA: Diagnosis not present

## 2019-01-21 DIAGNOSIS — M9901 Segmental and somatic dysfunction of cervical region: Secondary | ICD-10-CM | POA: Diagnosis not present

## 2019-01-21 DIAGNOSIS — M5032 Other cervical disc degeneration, mid-cervical region, unspecified level: Secondary | ICD-10-CM | POA: Diagnosis not present

## 2019-01-21 DIAGNOSIS — S29012A Strain of muscle and tendon of back wall of thorax, initial encounter: Secondary | ICD-10-CM | POA: Diagnosis not present

## 2019-01-24 ENCOUNTER — Encounter: Payer: Self-pay | Admitting: Family Medicine

## 2019-01-27 ENCOUNTER — Ambulatory Visit: Payer: PPO | Admitting: Family Medicine

## 2019-03-03 ENCOUNTER — Ambulatory Visit: Payer: PPO | Admitting: Podiatry

## 2019-03-03 ENCOUNTER — Other Ambulatory Visit: Payer: Self-pay

## 2019-03-03 ENCOUNTER — Ambulatory Visit (INDEPENDENT_AMBULATORY_CARE_PROVIDER_SITE_OTHER): Payer: PPO

## 2019-03-03 ENCOUNTER — Encounter: Payer: Self-pay | Admitting: Podiatry

## 2019-03-03 VITALS — Temp 97.7°F

## 2019-03-03 DIAGNOSIS — M779 Enthesopathy, unspecified: Secondary | ICD-10-CM

## 2019-03-03 DIAGNOSIS — M7752 Other enthesopathy of left foot: Secondary | ICD-10-CM | POA: Diagnosis not present

## 2019-03-03 DIAGNOSIS — M7751 Other enthesopathy of right foot: Secondary | ICD-10-CM

## 2019-03-03 DIAGNOSIS — S90211A Contusion of right great toe with damage to nail, initial encounter: Secondary | ICD-10-CM

## 2019-03-03 NOTE — Progress Notes (Signed)
Subjective:   Patient ID: Randy Cantu, male   DOB: 68 y.o.   MRN: [id number]   HPI Patient presents stating he jammed his right big toe and it is been black and blue with a blister and also he has a painful big toe joint and painful feet that orthotics have really helped him with but they are over 42 years old and are increasingly not holding his arch up properly.  Patient does not smoke and likes to be active   Review of Systems  All other systems reviewed and are negative.       Objective:  Physical Exam Vitals signs and nursing note reviewed.  Constitutional:      Appearance: He is well-developed.  Pulmonary:     Effort: Pulmonary effort is normal.  Musculoskeletal: Normal range of motion.  Skin:    General: Skin is warm.  Neurological:     Mental Status: He is alert.     Neurovascular status intact muscle strength is adequate range of motion was within normal limits with significant redness and irritation of the right big toe with blistering occurring proximal to the inner phalangeal joint.  There is also limitation of motion of the big toe joint right over left and there is low-grade inflammation of the lesser MPJs bilateral     Assessment:  Traumatized right hallux with blister formation and redness along with chronic hallux limitus rigidus capsulitis condition with pain controlled well with orthotics which have started to wear out at this point and patient who is noted to have good digital perfusion currently     Plan:  H&P conditions reviewed and reviewed his past medical history.  At this point I recommended orthotics to try to continue to encourage good motion hopefully avoid him from needing surgery on the first MPJ as we have been able to do up to this point and he is casted for new orthotics as they are over 6 years old.  I did drain the blister I flushed it and cleaned it and applied medication with sterile dressing and he will be seen back when orthotics are  ready  X-rays were negative for signs of fracture or any other signs of bone pathology associated with the injury.  I did note that there is quite a bit of bone spur formation of the first MPJ right with flattening of the joint surface

## 2019-03-06 ENCOUNTER — Telehealth: Payer: Self-pay | Admitting: Podiatry

## 2019-03-06 NOTE — Telephone Encounter (Signed)
Pt called and asked for [facility].But he was with a pt and stated has several pair of orthotics already and was scanned on Monday after seeing Dr Paulla Dolly but would like to hold off until we see if insurance is going to cover them. He said if he was to get them he would like them to be like his 3rd pair that he got. I got Jocelyn Lamer to take the charges out for now.   But pt would still like a call from [facility]

## 2019-03-24 ENCOUNTER — Other Ambulatory Visit: Payer: PPO | Admitting: Orthotics

## 2019-03-29 ENCOUNTER — Encounter: Payer: Self-pay | Admitting: Podiatry

## 2019-04-01 ENCOUNTER — Other Ambulatory Visit: Payer: Self-pay

## 2019-04-01 ENCOUNTER — Ambulatory Visit: Payer: PPO | Admitting: Orthotics

## 2019-04-01 ENCOUNTER — Ambulatory Visit: Payer: PPO | Admitting: Sports Medicine

## 2019-04-01 ENCOUNTER — Ambulatory Visit (INDEPENDENT_AMBULATORY_CARE_PROVIDER_SITE_OTHER): Payer: PPO

## 2019-04-01 ENCOUNTER — Encounter: Payer: Self-pay | Admitting: Sports Medicine

## 2019-04-01 ENCOUNTER — Other Ambulatory Visit: Payer: Self-pay | Admitting: Sports Medicine

## 2019-04-01 DIAGNOSIS — M79674 Pain in right toe(s): Secondary | ICD-10-CM

## 2019-04-01 DIAGNOSIS — S92414A Nondisplaced fracture of proximal phalanx of right great toe, initial encounter for closed fracture: Secondary | ICD-10-CM

## 2019-04-01 DIAGNOSIS — M999 Biomechanical lesion, unspecified: Secondary | ICD-10-CM

## 2019-04-01 DIAGNOSIS — M779 Enthesopathy, unspecified: Secondary | ICD-10-CM

## 2019-04-01 DIAGNOSIS — S90211A Contusion of right great toe with damage to nail, initial encounter: Secondary | ICD-10-CM

## 2019-04-01 DIAGNOSIS — S90211D Contusion of right great toe with damage to nail, subsequent encounter: Secondary | ICD-10-CM | POA: Diagnosis not present

## 2019-04-01 NOTE — Progress Notes (Signed)
Subjective: Randy Cantu is a 68 y.o. male patient who presents to office for evaluation of Right great toe pain reports that last month he bumped the toe and had bleeding underneath the nail had it checked by Dr. Paulla Dolly but since the toe has continued to stay red and swollen states that there is some pain at the knuckle joint of the first toe with some loss of motion.  Patient came in to see Liliane Channel today for orthotics and wants to have the toe checked.  Patient denies drainage odor nausea vomiting fever chills or any other constitutional symptoms at this time.  Patient denies any repeat injury to right great toe since last month.   Patient Active Problem List   Diagnosis Date Noted  . Right knee pain 01/01/2019  . Nonallopathic lesion of thoracic region 12/21/2017  . Nonallopathic lesion of cervical region 12/21/2017  . Nonallopathic lesion of rib cage 12/21/2017  . Arthritis of carpometacarpal [facility]) joint of left thumb 11/23/2017  . Cervical radiculopathy at C6 11/23/2017  . Anosmia 08/21/2017  . Controlled REM sleep behavior disorder 12/09/2014  . Periodic limb movement disorder (PLMD)   . History of colon polyps 07/21/2011  . GERD 01/04/2011  . ABDOMINAL PAIN, EPIGASTRIC 01/04/2011  . MIGRAINE VARIANT 06/10/2010  . MUSCLE STRAIN, HAMSTRING MUSCLE 06/10/2010  . ATRIAL FIBRILLATION 03/04/2010  . LOW BACK PAIN, ACUTE 05/13/2008  . HYPERLIPIDEMIA 06/14/2007  . GILBERT'S SYNDROME 06/14/2007  . ALLERGIC RHINITIS 06/14/2007  . WEAKNESS, MUSCLE 06/14/2007    Current Outpatient Medications on File Prior to Visit  Medication Sig Dispense Refill  . ALPRAZolam (XANAX) 0.5 MG tablet Take 1 tablet (0.5 mg total) by mouth at bedtime as needed for anxiety. 12 tablet 0  . apixaban (ELIQUIS) 5 MG TABS tablet Take 1 tablet (5 mg total) by mouth 2 (two) times daily. 180 tablet 3  . CARTIA XT 120 MG 24 hr capsule TAKE 1 CAPSULE(120 MG) BY MOUTH DAILY 90 capsule 2  . clonazePAM (KLONOPIN) 0.5 MG  tablet At night po 1.5 tab 135 tablet 1  . flecainide (TAMBOCOR) 150 MG tablet TAKE 1 TABLET BY MOUTH TWICE DAILY 180 tablet 3  . metronidazole (FLAGYL) 375 MG capsule Take 1 capsule (375 mg total) by mouth 2 (two) times daily. 30 capsule 0  . rosuvastatin (CRESTOR) 10 MG tablet TAKE 1 TABLET BY MOUTH DAILY 90 tablet 0  . Vitamin D, Ergocalciferol, (DRISDOL) 1.25 MG (50000 UT) CAPS capsule Take 1 capsule (50,000 Units total) by mouth every 7 (seven) days. 12 capsule 0   No current facility-administered medications on file prior to visit.     Allergies  Allergen Reactions  . Pantoprazole Sodium     REACTION: rash from this or from  topical disinfectant    Objective:  General: Alert and oriented x3 in no acute distress  Dermatology: Right great toenail partially attached with dried blood at the distal medial nail fold.  No open lesions bilateral lower extremities, no webspace macerations, no ecchymosis bilateral.  Vascular: Focal edema noted to right great toe.  Dorsalis Pedis and Posterior Tibial pedal pulses 1/4, Capillary Fill Time 5 seconds,(+) pedal hair growth bilateral, Temperature gradient within normal limits no increased warmth to the right great toe.  Neurology: Johney Maine sensation intact via light touch bilateral.  Musculoskeletal: There is tenderness with palpation at interphalangeal joint of the right great toe with mild stiffness and limitation of range of motion at the first toe, There is no pain to palpation only  with range of motion at the interphalangeal joint of the right great toe.   Xrays  Right Foot   Impression: Normal osseous mineralization there is a possible hallux interphalangeal joint fracture at the head of the proximal phalanx with no displacement that could possibly suggest why there is pain and stiffness at the IPJ joint.  There is significant arthritis noted at the first metatarsophalangeal joint on the right.  Mild focal soft tissue swelling to the right  great toe no other acute findings.    Assessment and Plan: Problem List Items Addressed This Visit    None    Visit Diagnoses    Contusion of right great toe with damage to nail, initial encounter    -  Primary   Nondisplaced fracture of proximal phalanx of right great toe, initial encounter for closed fracture       Possible at IPJ   Toe pain, right           -Complete examination performed -Xrays reviewed -Discussed treatement options for contusion with possible fracture at the interphalangeal joint of the right great toe with hematoma/lysis of nail; risks, alternatives, and benefits explained. -Patient declined nail avulsion procedure at this visit and states that he wants to allow the nail to fall off on its own -Advised patient to wear a stiff soled shoe that protects the first toe but does not been and may use custom orthotics as long as there is no increased pain rubbing stiffness at the first toe on the right foot -Recommend protection, rest, ice, elevation daily until symptoms improve -May take Tylenol as needed for pain -Patient to return to office if the right first toe fails to continue to improve or sooner if condition worsens.  Landis Martins, DPM

## 2019-04-01 NOTE — Progress Notes (Signed)
Patient came in today to pick up custom made foot orthotics.  The goals were accomplished and the patient reported no dissatisfaction with said orthotics.  Patient was advised of breakin period and how to report any issues. 

## 2019-04-11 ENCOUNTER — Other Ambulatory Visit: Payer: Self-pay | Admitting: Internal Medicine

## 2019-04-11 DIAGNOSIS — E785 Hyperlipidemia, unspecified: Secondary | ICD-10-CM

## 2019-04-11 DIAGNOSIS — I480 Paroxysmal atrial fibrillation: Secondary | ICD-10-CM

## 2019-04-11 MED ORDER — DILTIAZEM HCL ER COATED BEADS 120 MG PO CP24: ORAL_CAPSULE | ORAL | 0 refills | Status: DC

## 2019-04-11 MED ORDER — ROSUVASTATIN CALCIUM 10 MG PO TABS: 10.0000 mg | ORAL_TABLET | Freq: Every day | ORAL | 0 refills | Status: DC

## 2019-04-11 MED ORDER — FLECAINIDE ACETATE 150 MG PO TABS: 150.0000 mg | ORAL_TABLET | Freq: Two times a day (BID) | ORAL | 0 refills | Status: DC

## 2019-04-11 MED ORDER — APIXABAN 5 MG PO TABS: 5.0000 mg | ORAL_TABLET | Freq: Two times a day (BID) | ORAL | 1 refills | Status: DC

## 2019-04-11 NOTE — Telephone Encounter (Signed)
Age 68, weight 102.5kg, SCr 1.13 on 04/18/18

## 2019-04-11 NOTE — Telephone Encounter (Signed)
New Message             *STAT* If patient is at the pharmacy, call can be transferred to refill team.   1. Which medications need to be refilled? (please list name of each medication and dose if known) Fleecianide/Eliquis/Rouvastation/Cartia  2. Which pharmacy/location (including street and city if local pharmacy) is medication to be sent to?[facility] [phone]  3. Do they need a 30 day or 90 day supply? [place]

## 2019-05-14 ENCOUNTER — Ambulatory Visit: Payer: PPO | Admitting: Internal Medicine

## 2019-05-14 ENCOUNTER — Encounter: Payer: Self-pay | Admitting: Internal Medicine

## 2019-05-14 ENCOUNTER — Other Ambulatory Visit: Payer: Self-pay

## 2019-05-14 VITALS — BP 114/78 | HR 44 | Ht 73.0 in | Wt 219.4 lb

## 2019-05-14 DIAGNOSIS — I480 Paroxysmal atrial fibrillation: Secondary | ICD-10-CM

## 2019-05-14 DIAGNOSIS — E785 Hyperlipidemia, unspecified: Secondary | ICD-10-CM

## 2019-05-14 MED ORDER — DILTIAZEM HCL ER COATED BEADS 120 MG PO CP24: 120.0000 mg | ORAL_CAPSULE | Freq: Every day | ORAL | 3 refills | Status: DC

## 2019-05-14 MED ORDER — APIXABAN 5 MG PO TABS: 5.0000 mg | ORAL_TABLET | Freq: Two times a day (BID) | ORAL | 3 refills | Status: DC

## 2019-05-14 MED ORDER — FLECAINIDE ACETATE 100 MG PO TABS: 100.0000 mg | ORAL_TABLET | Freq: Two times a day (BID) | ORAL | 3 refills | Status: DC

## 2019-05-14 MED ORDER — ROSUVASTATIN CALCIUM 10 MG PO TABS: 10.0000 mg | ORAL_TABLET | Freq: Every day | ORAL | 3 refills | Status: DC

## 2019-05-14 NOTE — Patient Instructions (Signed)
Medication Instructions:  Decrease your flecainide (Tambocor) to 100mg  two times daily.  Labwork: None  Testing/Procedures: None  Follow-Up: 1 year with Dr. Caryl Comes  Any Other Special Instructions Will Be Listed Below (If Applicable).     If you need a refill on your cardiac medications before your next appointment, please call your pharmacy.

## 2019-05-14 NOTE — Progress Notes (Addendum)
Patient Care Team: [name], MD as PCP - General (Family Medicine) [name], MD as Consulting Physician (Cardiology)   HPI  Randy Cantu is a 68 y.o. male Seen in follow-up for atrial fibrillation managed with a combination of verapamil and flecainide.  He is status post PVI 1/12-JA  DATE PR interval QRSduration Flecainide HR   2009 19 90 0   12/18 208 108 150   6/19 222 114 150 56  6/20 224 106 150 44   No interval atrial fibrillation.  He is now fully retired.  No exercise intolerance chest pain or edema.  No shortness of breath.  Living his life.  No bleeding on Eliquis.  Tolerating his statin.  Thromboembolic risk factors ( age -84) for a CHADSVASc Score of 1    Records and Results Reviewed  Past Medical History:  Diagnosis Date  . Allergic rhinitis   . HLD (hyperlipidemia)   . Lactose intolerance   . PAF (paroxysmal atrial fibrillation) ([place])    s/p afib ablation 11/17/10  . Periodic limb movement disorder (PLMD)   . Right foot pain    all of foot 1--/2016    Past Surgical History:  Procedure Laterality Date  . atrial fibrillation ablation  11/17/10   PVI by Dr Rayann Heman  . TONSILLECTOMY      Current Outpatient Medications  Medication Sig Dispense Refill  . apixaban (ELIQUIS) 5 MG TABS tablet Take 1 tablet (5 mg total) by mouth 2 (two) times daily. 180 tablet 1  . clonazePAM (KLONOPIN) 0.5 MG tablet Take 1.5 mg by mouth at bedtime.    Marland Kitchen diltiazem (CARTIA XT) 120 MG 24 hr capsule TAKE 1 CAPSULE(120 MG) BY MOUTH DAILY. Please keep upcoming appt with Dr. Caryl Comes in July for future refills. Thank you 90 capsule 0  . flecainide (TAMBOCOR) 150 MG tablet Take 1 tablet (150 mg total) by mouth 2 (two) times daily. Please keep upcoming appt with Dr. Caryl Comes in July for future refills. Thank you 180 tablet 0  . rosuvastatin (CRESTOR) 10 MG tablet Take 1 tablet (10 mg total) by mouth daily. Please keep upcoming appt with Dr. Caryl Comes in July for future refills.  Thank you 90 tablet 0   No current facility-administered medications for this visit.     Allergies  Allergen Reactions  . Pantoprazole Sodium     REACTION: rash from this or from  topical disinfectant      Review of Systems negative except from HPI and PMH  Physical Exam BP 114/78   Pulse (!) 44   Ht 6\' 1"  (1.854 m)   Wt 219 lb 6.4 oz (99.5 kg)   SpO2 97%   BMI 28.95 kg/m  Well developed and nourished in no acute distress HENT normal Neck supple with JVP-  flat   Clear Regular rate and rhythm, no murmurs or gallops Abd-soft with active BS No Clubbing cyanosis edema Skin-warm and dry A & Oriented  Grossly normal sensory and motor function  ECG sinus at 44 Interval 22/11/49  Assessment and  Plan  Atrial fib paroxysmal  Sinus bradycardia  Hyperlipidemia  On Anticoagulation;  No bleeding issues   No intercurrent atrial fibrillation or flutter  Bradycardia seems to be asymptomatic.  For now we will continue him on his calcium blocker; alternative would be an ISA beta-blocker   Given the paucity of atrial fibrillation and bradycardia, we have elected to decrease his flecainide from 150--100 mg twice daily  We  have reviewed his 10-year cardiovascular risk.  He has intermediate risk and we discussed the role of calcium scoring to help with statin therapy.  He would like to continue doing what he is doing  .We spent more than 50% of our >25 min visit in face to face counseling regarding the above

## 2019-05-29 NOTE — Telephone Encounter (Signed)
Answered previously  Duplicate message

## 2019-06-24 ENCOUNTER — Telehealth: Payer: Self-pay | Admitting: Podiatry

## 2019-06-24 NOTE — Telephone Encounter (Signed)
Pt left message stating he would like to order another pair of orthotics.  I returned call and asked pt to call me back to discuss. To see if he wanted to bill insurance or cash pay option.

## 2019-06-26 ENCOUNTER — Other Ambulatory Visit: Payer: Self-pay | Admitting: Internal Medicine

## 2019-06-26 DIAGNOSIS — I480 Paroxysmal atrial fibrillation: Secondary | ICD-10-CM

## 2019-06-27 ENCOUNTER — Telehealth: Payer: Self-pay | Admitting: Podiatry

## 2019-06-27 NOTE — Telephone Encounter (Signed)
Pt left message about getting a second pair of orthotics ordered. He would like to see if we can get another auth from [employer] if not he will pay the 199.00. He asked if the new orthotics he is ordering could have more cushioning in them.   I returned call and told pt I would send over paperwork to [employer] but could not quarentee they will cover another pair and I would send this message to Liliane Channel to have him order the second pair.

## 2019-07-01 DIAGNOSIS — M779 Enthesopathy, unspecified: Secondary | ICD-10-CM | POA: Diagnosis not present

## 2019-07-17 ENCOUNTER — Other Ambulatory Visit: Payer: Self-pay | Admitting: Neurology

## 2019-07-29 ENCOUNTER — Other Ambulatory Visit: Payer: Self-pay

## 2019-07-29 ENCOUNTER — Ambulatory Visit: Payer: PPO | Admitting: Orthotics

## 2019-07-29 DIAGNOSIS — S90211A Contusion of right great toe with damage to nail, initial encounter: Secondary | ICD-10-CM

## 2019-07-29 DIAGNOSIS — S92414A Nondisplaced fracture of proximal phalanx of right great toe, initial encounter for closed fracture: Secondary | ICD-10-CM

## 2019-07-29 NOTE — Progress Notes (Signed)
Spent a lot of time talking with patient/modifying old set of orthoics.  Added reverse mortons extension to left to drop 1st ray, help windless mechanism due to [employer].     He brought in F/O that he just received and wanted more cushioning; I will send back to Richy to add 1/8" poron; however, he is going to try out the f/o I modifies to see if he wants also reverse morton's in those.

## 2019-08-07 ENCOUNTER — Telehealth: Payer: Self-pay | Admitting: Sports Medicine

## 2019-08-07 NOTE — Telephone Encounter (Signed)
Pt returned Rick's call and left message stating the orthotics he got are good except the little piece that was added it is to hard. If you could add some cushioning to that spot they would be great.

## 2019-09-23 ENCOUNTER — Other Ambulatory Visit: Payer: PPO | Admitting: Orthotics

## 2019-09-23 ENCOUNTER — Other Ambulatory Visit: Payer: Self-pay

## 2019-10-07 ENCOUNTER — Ambulatory Visit (INDEPENDENT_AMBULATORY_CARE_PROVIDER_SITE_OTHER): Payer: PPO | Admitting: Family Medicine

## 2019-10-07 ENCOUNTER — Encounter: Payer: Self-pay | Admitting: Family Medicine

## 2019-10-07 ENCOUNTER — Ambulatory Visit: Payer: Self-pay

## 2019-10-07 ENCOUNTER — Other Ambulatory Visit: Payer: Self-pay

## 2019-10-07 VITALS — BP 122/74 | HR 47 | Ht 73.0 in | Wt 221.0 lb

## 2019-10-07 DIAGNOSIS — S7002XA Contusion of left hip, initial encounter: Secondary | ICD-10-CM

## 2019-10-07 DIAGNOSIS — M25552 Pain in left hip: Secondary | ICD-10-CM

## 2019-10-07 DIAGNOSIS — S46012A Strain of muscle(s) and tendon(s) of the rotator cuff of left shoulder, initial encounter: Secondary | ICD-10-CM

## 2019-10-07 DIAGNOSIS — M75102 Unspecified rotator cuff tear or rupture of left shoulder, not specified as traumatic: Secondary | ICD-10-CM | POA: Insufficient documentation

## 2019-10-07 NOTE — Assessment & Plan Note (Signed)
Bruise with only pain with palpation.  Discussed that this can take 3 weeks.  No need for any anal increase in imaging.  Follow-up again in 3 to 4 weeks

## 2019-10-07 NOTE — Patient Instructions (Addendum)
  Injected shoulder today Exercises 3x a week Arnica lotion Warm compresses and massage to side of hip See me in 5-6 weeks    [address], [address]st floor [place], [place] [address] Phone [phone]

## 2019-10-07 NOTE — Progress Notes (Signed)
Randy Cantu, [place] [address] Phone: [phone] Subjective:   [name], am serving as a scribe for Dr. Hulan Saas.  This visit occurred during the SARS-CoV-2 public health emergency.  Safety protocols were in place, including screening questions prior to the visit, additional usage of staff PPE, and extensive cleaning of exam room while observing appropriate contact time as indicated for disinfecting solutions.    CC: Left shoulder and left hip pain  [id number]   01/01/2019 Right knee pain.  Discussed with patient about calcific changes.  We discussed that the tibial area this is likely from the injury.  Possible gout.  Labs ordered.  Discussed avoiding direct impact in the area but warm compresses.  Follow-up again in 4 weeks  Update 10/07/2019 Randy Cantu is a 68 y.o. male coming in with complaint of left hip pain and left shoulder pain. Feel one week ago on his hip and shoulder after catching his foot on a [employer] trailer. Pain with lateral shift to the left hip. Pain is over the ASIS and iliac crest.   Anterior shoulder pain since fall as well. Hard to sleep on that side.  Patient states it has been a dull, throbbing aching pain.  Patient states symptoms fluid has been worsening over the course of the since the fall.  Denies though any significant weakness.  Worse pain with trying to reach across his body.    Past Medical History:  Diagnosis Date  . Allergic rhinitis   . HLD (hyperlipidemia)   . Lactose intolerance   . PAF (paroxysmal atrial fibrillation) ([place])    s/p afib ablation 11/17/10  . Periodic limb movement disorder (PLMD)   . Right foot pain    all of foot 1--/2016   Past Surgical History:  Procedure Laterality Date  . atrial fibrillation ablation  11/17/10   PVI by Dr Rayann Heman  . TONSILLECTOMY     Social History   Socioeconomic History  . Marital status: Married    Spouse name: Not on file  .  Number of children: 2  . Years of education: Not on file  . Highest education level: Not on file  Occupational History  . Occupation: DOCTOR/EYE    Employer: SELF EMPLOYED  Social Needs  . Financial resource strain: Not on file  . Food insecurity    Worry: Not on file    Inability: Not on file  . Transportation needs    Medical: Not on file    Non-medical: Not on file  Tobacco Use  . Smoking status: Never Smoker  . Smokeless tobacco: Never Used  Substance and Sexual Activity  . Alcohol use: Yes    Alcohol/week: 7.0 standard drinks    Types: 7 Glasses of wine per week  . Drug use: No  . Sexual activity: Not on file  Lifestyle  . Physical activity    Days per week: Not on file    Minutes per session: Not on file  . Stress: Not on file  Relationships  . Social Herbalist on phone: Not on file    Gets together: Not on file    Attends religious service: Not on file    Active member of club or organization: Not on file    Attends meetings of clubs or organizations: Not on file    Relationship status: Not on file  Other Topics Concern  . 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   Allergies  Allergen Reactions  . Pantoprazole Sodium     REACTION: rash from this or from  topical disinfectant   Family History  Problem Relation Age of Onset  . Peripheral vascular disease Mother        afib in her 73s  . Aplastic anemia Father   . Cancer Other   . Prostate cancer Maternal Grandfather   . Leukemia Paternal Grandfather   . Colon cancer Neg Hx      Current Outpatient Medications (Cardiovascular):  .  diltiazem (CARDIZEM CD) 120 MG 24 hr capsule, TAKE 1 CAPSULE(120 MG) BY MOUTH DAILY .  flecainide (TAMBOCOR) 100 MG tablet, Take 1 tablet (100 mg total) by mouth 2 (two) times daily. .  rosuvastatin (CRESTOR) 10 MG tablet, Take 1 tablet (10 mg total) by mouth daily.    Current  Outpatient Medications (Hematological):  .  apixaban (ELIQUIS) 5 MG TABS tablet, Take 1 tablet (5 mg total) by mouth 2 (two) times daily.  Current Outpatient Medications (Other):  .  clonazePAM (KLONOPIN) 0.5 MG tablet, TAKE 1 AND 1/2 TABLETS BY MOUTH AT NIGHT    Past medical history, social, surgical and family history all reviewed in electronic medical record.  No pertanent information unless stated regarding to the chief complaint.   Review of Systems:  No headache, visual changes, nausea, vomiting, diarrhea, constipation, dizziness, abdominal pain, skin rash, fevers, chills, night sweats, weight loss, swollen lymph nodes, body aches, joint swelling,  chest pain, shortness of breath, mood changes.  Positive muscle aches  Objective  Blood pressure 122/74, pulse (!) 47, height 6\' 1"  (1.854 m), weight 221 lb (100.2 kg), SpO2 99 %.    General: No apparent distress alert and oriented x3 mood and affect normal, dressed appropriately.  HEENT: Pupils equal, extraocular movements intact  Respiratory: Patient's speak in full sentences and does not appear short of breath  Cardiovascular: No lower extremity edema, non tender, no erythema  Skin: Warm dry intact with no signs of infection or rash on extremities or on axial skeleton.  Abdomen: Soft nontender  Neuro: Cranial nerves II through XII are intact, neurovascularly intact in all extremities with 2+ DTRs and 2+ pulses.  Lymph: No lymphadenopathy of posterior or anterior cervical chain or axillae bilaterally.  Gait mild antalgic MSK:  tender with full range of motion and good stability and symmetric strength and tone of  elbows, wrist,  knee and ankles bilaterally.  Left hip exam does have some bruising noted on the lateral aspect.  Full range of motion of the hip noted.  Negative straight leg test.  Patient is tender over the greater trochanteric area but otherwise unremarkable.  Left shoulder exam has a positive impingement.  Rotator cuff  strength 4+ out of 5 compared to the contralateral side.  Lacks last 5 degrees of external rotation.  Limited musculoskeletal ultrasound was performed and interpreted by Lyndal Pulley  Limited ultrasound of patient's left shoulder shows the patient does have a supraspinatus tear noted.  No significant retraction with a significant bursitis deltoid  Impression: Rotator cuff tear  Procedure: Real-time Ultrasound Guided Injection of left glenohumeral joint Device: GE Logiq E  Ultrasound guided injection is preferred based studies that show increased duration, increased effect, greater accuracy, decreased procedural pain, increased response rate with ultrasound guided versus blind injection.  Verbal informed consent obtained.  Time-out conducted.  Noted no overlying erythema,  induration, or other signs of local infection.  Skin prepped in a sterile fashion.  Local anesthesia: Topical Ethyl chloride.  With sterile technique and under real time ultrasound guidance:  Joint visualized.  21g 2 inch needle inserted posterior approach. Pictures taken for needle placement. Patient did have injection of 2 cc of 0.5% Marcaine, and 1cc of Kenalog 40 mg/dL. Completed without difficulty  Pain immediately resolved suggesting accurate placement of the medication.  Advised to call if fevers/chills, erythema, induration, drainage, or persistent bleeding.  Images permanently stored and available for review in the ultrasound unit.  Impression: Technically successful ultrasound guided injection.  [id number]; 15 additional minutes spent for Therapeutic exercises as stated in above notes.  This included exercises focusing on stretching, strengthening, with significant focus on eccentric aspects.   Long term goals include an improvement in range of motion, strength, endurance as well as avoiding reinjury. Patient's frequency would include in 1-2 times a day, 3-5 times a week for a duration of 6-12 weeks. Shoulder Exercises  that included:  Basic scapular stabilization to include adduction and depression of scapula Scaption, focusing on proper movement and good control Internal and External rotation utilizing a theraband, with elbow tucked at side entire time Rows with theraband   Proper technique shown and discussed handout in great detail with ATC.  All questions were discussed and answered.       Impression and Recommendations:     This case required medical decision making of moderate complexity. The above documentation has been reviewed and is accurate and complete Lyndal Pulley, DO       Note: This dictation was prepared with Dragon dictation along with smaller phrase technology. Any transcriptional errors that result from this process are unintentional.

## 2019-10-07 NOTE — Assessment & Plan Note (Signed)
Left rotator cuff tear.  With a fairly significant reactive bursitis.  Injection given today.  Tolerated the procedure well, topical anti-inflammatories given trial, icing regimen, home exercises.  Follow-up again in 4 to 8 weeks

## 2019-10-13 ENCOUNTER — Other Ambulatory Visit: Payer: Self-pay

## 2019-10-13 DIAGNOSIS — Z20822 Contact with and (suspected) exposure to covid-19: Secondary | ICD-10-CM

## 2019-10-15 LAB — NOVEL CORONAVIRUS, NAA: SARS-CoV-2, NAA: DETECTED — AB

## 2019-10-17 ENCOUNTER — Telehealth: Payer: Self-pay | Admitting: Unknown Physician Specialty

## 2019-10-17 ENCOUNTER — Other Ambulatory Visit: Payer: Self-pay | Admitting: Unknown Physician Specialty

## 2019-10-17 DIAGNOSIS — U071 COVID-19: Secondary | ICD-10-CM

## 2019-10-17 NOTE — Telephone Encounter (Signed)
Called to discuss with patient about Covid symptoms and the use of bamlanivimab, a monoclonal antibody infusion for those with mild to moderate Covid symptoms and at a high risk of hospitalization.  Pt is qualified for this infusion at the [facility] infusion center due to Age > 65   Left message to call back

## 2019-10-17 NOTE — Telephone Encounter (Signed)
  I connected by phone with Houston Siren on 10/17/2019 at 3:37 PM to discuss the potential use of an new treatment for mild to moderate COVID-19 viral infection in non-hospitalized patients.  This patient is a 68 y.o. male that meets the FDA criteria for Emergency Use Authorization of bamlanivimab or casirivimab\imdevimab.  Has a (+) direct SARS-CoV-2 viral test result  Has mild or moderate COVID-19   Is ? 67 years of age and weighs ? 40 kg  Is NOT hospitalized due to COVID-19  Is NOT requiring oxygen therapy or requiring an increase in baseline oxygen flow rate due to COVID-19  Is within 10 days of symptom onset  Has at least one of the high risk factor(s) for progression to severe COVID-19 and/or hospitalization as defined in EUA.  Specific high risk criteria : >/= 67 yo   I have spoken and communicated the following to the patient or parent/caregiver:  1. FDA has authorized the emergency use of bamlanivimab and casirivimab\imdevimab for the treatment of mild to moderate COVID-19 in adults and pediatric patients with positive results of direct SARS-CoV-2 viral testing who are 7 years of age and older weighing at least 40 kg, and who are at high risk for progressing to severe COVID-19 and/or hospitalization.  2. The significant known and potential risks and benefits of bamlanivimab and casirivimab\imdevimab, and the extent to which such potential risks and benefits are unknown.  3. Information on available alternative treatments and the risks and benefits of those alternatives, including clinical trials.  4. Patients treated with bamlanivimab and casirivimab\imdevimab should continue to self-isolate and use infection control measures (e.g., wear mask, isolate, social distance, avoid sharing personal items, clean and disinfect "high touch" surfaces, and frequent handwashing) according to CDC guidelines.   5. The patient or parent/caregiver has the option to accept or refuse  bamlanivimab or casirivimab\imdevimab .  After reviewing this information with the patient, has agreed to the infusion. Kathrine Haddock 10/17/2019 3:37 PM

## 2019-10-20 ENCOUNTER — Ambulatory Visit (HOSPITAL_COMMUNITY)
Admission: RE | Admit: 2019-10-20 | Discharge: 2019-10-20 | Disposition: A | Discharge status: Home / Self Care | Payer: Medicare Other | Source: Ambulatory Visit | Attending: Critical Care Medicine | Admitting: Critical Care Medicine

## 2019-10-20 DIAGNOSIS — Z23 Encounter for immunization: Secondary | ICD-10-CM | POA: Insufficient documentation

## 2019-10-20 DIAGNOSIS — U071 COVID-19: Secondary | ICD-10-CM | POA: Insufficient documentation

## 2019-10-20 MED ORDER — DIPHENHYDRAMINE HCL 50 MG/ML IJ SOLN: 50.0000 mg | Freq: Once | INTRAMUSCULAR | Status: DC | PRN

## 2019-10-20 MED ORDER — EPINEPHRINE 0.3 MG/0.3ML IJ SOAJ: 0.3000 mg | Freq: Once | INTRAMUSCULAR | Status: DC | PRN

## 2019-10-20 MED ORDER — METHYLPREDNISOLONE SODIUM SUCC 125 MG IJ SOLR: 125.0000 mg | Freq: Once | INTRAMUSCULAR | Status: DC | PRN

## 2019-10-20 MED ORDER — FAMOTIDINE IN NACL 20-0.9 MG/50ML-% IV SOLN: 20.0000 mg | Freq: Once | INTRAVENOUS | Status: DC | PRN

## 2019-10-20 MED ORDER — SODIUM CHLORIDE 0.9 % IV SOLN
INTRAVENOUS | Status: DC | PRN
  Administered 2019-10-20: 13:00:00 250 mL via INTRAVENOUS

## 2019-10-20 MED ORDER — SODIUM CHLORIDE 0.9 % IV SOLN
700.0000 mg | Freq: Once | INTRAVENOUS | Status: AC
  Administered 2019-10-20: 700 mg via INTRAVENOUS
  Filled 2019-10-20: qty 20

## 2019-10-20 MED ORDER — ALBUTEROL SULFATE HFA 108 (90 BASE) MCG/ACT IN AERS: 2.0000 | INHALATION_SPRAY | Freq: Once | RESPIRATORY_TRACT | Status: DC | PRN

## 2019-10-20 NOTE — Progress Notes (Signed)
  Diagnosis: COVID-19  Physician: Dr. Joya Gaskins  Procedure: [facility]fusion [facility] Med: bamlanivimab infusion - Provided patient with bamlanimivab fact sheet for patients, parents and caregivers prior to infusion.  Complications: No immediate complications noted.  Discharge: Discharged home   [facility] 10/20/2019

## 2019-10-24 DIAGNOSIS — U071 COVID-19: Secondary | ICD-10-CM | POA: Diagnosis not present

## 2019-10-28 DIAGNOSIS — R21 Rash and other nonspecific skin eruption: Secondary | ICD-10-CM | POA: Diagnosis not present

## 2019-10-28 DIAGNOSIS — U071 COVID-19: Secondary | ICD-10-CM | POA: Diagnosis not present

## 2019-11-19 ENCOUNTER — Ambulatory Visit (INDEPENDENT_AMBULATORY_CARE_PROVIDER_SITE_OTHER): Payer: PPO | Admitting: Family Medicine

## 2019-11-19 ENCOUNTER — Ambulatory Visit (INDEPENDENT_AMBULATORY_CARE_PROVIDER_SITE_OTHER): Payer: PPO

## 2019-11-19 ENCOUNTER — Encounter: Payer: Self-pay | Admitting: Family Medicine

## 2019-11-19 ENCOUNTER — Other Ambulatory Visit: Payer: Self-pay

## 2019-11-19 VITALS — BP 114/80 | HR 54 | Ht 73.0 in | Wt 226.0 lb

## 2019-11-19 DIAGNOSIS — G8929 Other chronic pain: Secondary | ICD-10-CM

## 2019-11-19 DIAGNOSIS — S46012A Strain of muscle(s) and tendon(s) of the rotator cuff of left shoulder, initial encounter: Secondary | ICD-10-CM

## 2019-11-19 DIAGNOSIS — M25512 Pain in left shoulder: Secondary | ICD-10-CM

## 2019-11-19 MED ORDER — PREDNISONE 50 MG PO TABS: ORAL_TABLET | ORAL | 0 refills | Status: DC

## 2019-11-19 NOTE — Patient Instructions (Signed)
Good to see you Ice before bed Start exercises Prednisone 5 days See me again in 4 weeks if not better we will inject bursa

## 2019-11-19 NOTE — Assessment & Plan Note (Signed)
Patient shows healing interval healing on the ultrasound.  Patient will does have what seems to be more of a reactive bursitis noted today.  Discussed with patient about icing regimen and home exercises.  Discussed which activities to do which wants to avoid.  Topical anti-inflammatories.  Icing regimen.  Follow-up again in 4 weeks and if continuing to have pain consider injection again.

## 2019-11-19 NOTE — Progress Notes (Signed)
[facility] [address] [place] Phone: [phone] Subjective:   I Randy Cantu am serving as a Education administrator for Dr. Hulan Saas.  This visit occurred during the SARS-CoV-2 public health emergency.  Safety protocols were in place, including screening questions prior to the visit, additional usage of staff PPE, and extensive cleaning of exam room while observing appropriate contact time as indicated for disinfecting solutions.   I'm seeing this patient by the request  of:    CC: Left arm pain follow-up  [id number]   10/07/2019 Bruise with only pain with palpation.  Discussed that this can take 3 weeks.  No need for any anal increase in imaging.  Follow-up again in 3 to 4 weeks  Left rotator cuff tear.  With a fairly significant reactive bursitis.  Injection given today.  Tolerated the procedure well, topical anti-inflammatories given trial, icing regimen, home exercises.  Follow-up again in 4 to 8 weeks  Update 11/19/2019 Randy Cantu is a 69 y.o. male coming in with complaint of left shoulder and hip pain. Patient states his hip is doing great. Shoulder wakes him up at night. States he is TTP on the lateral side of his shoulder. The pain is deep.  Patient states it is mostly only at night.  Certain movements can become a little difficult throughout the day.  Patient did have Covid and did have shortness of breath.     Past Medical History:  Diagnosis Date  . Allergic rhinitis   . HLD (hyperlipidemia)   . Lactose intolerance   . PAF (paroxysmal atrial fibrillation) ([facility])    s/p afib ablation 11/17/10  . Periodic limb movement disorder (PLMD)   . Right foot pain    all of foot 1--/2016   Past Surgical History:  Procedure Laterality Date  . atrial fibrillation ablation  11/17/10   PVI by Dr Rayann Heman  . TONSILLECTOMY     Social History   Socioeconomic History  . Marital status: Married    Spouse name: Not on file  . Number of  children: 2  . Years of education: Not on file  . Highest education level: Not on file  Occupational History  . Occupation: DOCTOR/EYE    Employer: SELF EMPLOYED  Tobacco Use  . Smoking status: Never Smoker  . Smokeless tobacco: Never Used  Substance and Sexual Activity  . Alcohol use: Yes    Alcohol/week: 7.0 standard drinks    Types: 7 Glasses of wine per week  . Drug use: No  . Sexual activity: Not on file  Other Topics Concern  . 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   Social Determinants of Health   Financial Resource Strain:   . Difficulty of Paying Living Expenses: Not on file  Food Insecurity:   . Worried About Charity fundraiser in the Last Year: Not on file  . Ran Out of Food in the Last Year: Not on file  Transportation Needs:   . Lack of Transportation (Medical): Not on file  . Lack of Transportation (Non-Medical): Not on file  Physical Activity:   . Days of Exercise per Week: Not on file  . Minutes of Exercise per Session: Not on file  Stress:   . Feeling of Stress : Not on file  Social Connections:   . Frequency of Communication with Friends and Family: Not on  file  . Frequency of Social Gatherings with Friends and Family: Not on file  . Attends Religious Services: Not on file  . Active Member of Clubs or Organizations: Not on file  . Attends Archivist Meetings: Not on file  . Marital Status: Not on file   Allergies  Allergen Reactions  . Pantoprazole Sodium     REACTION: rash from this or from  topical disinfectant   Family History  Problem Relation Age of Onset  . Peripheral vascular disease Mother        afib in her 33s  . Aplastic anemia Father   . Cancer Other   . Prostate cancer Maternal Grandfather   . Leukemia Paternal Grandfather   . Colon cancer Neg Hx     Current Outpatient Medications (Endocrine & Metabolic):  .  predniSONE (DELTASONE)  50 MG tablet, 1 tablet by mouth daily  Current Outpatient Medications (Cardiovascular):  .  diltiazem (CARDIZEM CD) 120 MG 24 hr capsule, TAKE 1 CAPSULE(120 MG) BY MOUTH DAILY .  flecainide (TAMBOCOR) 100 MG tablet, Take 1 tablet (100 mg total) by mouth 2 (two) times daily. .  rosuvastatin (CRESTOR) 10 MG tablet, Take 1 tablet (10 mg total) by mouth daily.    Current Outpatient Medications (Hematological):  .  apixaban (ELIQUIS) 5 MG TABS tablet, Take 1 tablet (5 mg total) by mouth 2 (two) times daily.  Current Outpatient Medications (Other):  .  clonazePAM (KLONOPIN) 0.5 MG tablet, TAKE 1 AND 1/2 TABLETS BY MOUTH AT NIGHT    Past medical history, social, surgical and family history all reviewed in electronic medical record.  No pertanent information unless stated regarding to the chief complaint.   Review of Systems:  No headache, visual changes, nausea, vomiting, diarrhea, constipation, dizziness, abdominal pain, skin rash, fevers, chills, night sweats, weight loss, swollen lymph nodes, body aches, joint swelling, muscle aches, chest pain, shortness of breath, mood changes.   Objective  Blood pressure 114/80, pulse (!) 54, height 6\' 1"  (1.854 m), weight 226 lb (102.5 kg), SpO2 97 %.   General: No apparent distress alert and oriented x3 mood and affect normal, dressed appropriately.  HEENT: Pupils equal, extraocular movements intact  Respiratory: Patient's speak in full sentences and does not appear short of breath  Cardiovascular: No lower extremity edema, non tender, no erythema  Skin: Warm dry intact with no signs of infection or rash on extremities or on axial skeleton.  Abdomen: Soft nontender  Neuro: Cranial nerves II through XII are intact, neurovascularly intact in all extremities with 2+ DTRs and 2+ pulses.  Lymph: No lymphadenopathy of posterior or anterior cervical chain or axillae bilaterally.  Gait normal with good balance and coordination.  MSK:  Non tender with full  range of motion and good stability and symmetric strength and tone of  elbows, wrist, hip, knee and ankles bilaterally.  Left shoulder exam shows the patient does have positive impingement still remaining.  4+ out of 5 strength of the rotator cuff.  Near full range of motion noted today.  Limited musculoskeletal ultrasound was performed and interpreted by Lyndal Pulley  Limited ultrasound shows the patient does have good interval healing noted of the rotator cuff tear that was appreciated previously by a severe bursitis noted in the shoulder at this time.   Impression and Recommendations:     This case required medical decision making of moderate complexity. The above documentation has been reviewed and is accurate and complete Lyndal Pulley, DO  Note: This dictation was prepared with Dragon dictation along with smaller phrase technology. Any transcriptional errors that result from this process are unintentional.

## 2019-11-21 DIAGNOSIS — R0602 Shortness of breath: Secondary | ICD-10-CM | POA: Diagnosis not present

## 2019-11-21 DIAGNOSIS — L309 Dermatitis, unspecified: Secondary | ICD-10-CM | POA: Diagnosis not present

## 2019-11-27 ENCOUNTER — Other Ambulatory Visit: Payer: Self-pay

## 2019-11-27 ENCOUNTER — Encounter: Payer: Self-pay | Admitting: Neurology

## 2019-11-27 ENCOUNTER — Ambulatory Visit: Payer: PPO | Admitting: Neurology

## 2019-11-27 VITALS — BP 121/69 | HR 57 | Temp 98.1°F | Ht 73.0 in | Wt 213.0 lb

## 2019-11-27 DIAGNOSIS — G478 Other sleep disorders: Secondary | ICD-10-CM | POA: Diagnosis not present

## 2019-11-27 DIAGNOSIS — G4752 REM sleep behavior disorder: Secondary | ICD-10-CM

## 2019-11-27 MED ORDER — CLONAZEPAM 0.5 MG PO TABS: 1.0000 mg | ORAL_TABLET | Freq: Every day | ORAL | 1 refills | Status: DC

## 2019-11-27 NOTE — Patient Instructions (Signed)

## 2019-11-27 NOTE — Progress Notes (Signed)
[facility]  Provider:  Larey Seat, MD   Referring Provider: [name], MD Primary Care Physician:  [name], MD  Chief Complaint  Patient presents with  . Follow-up    pt alone, rm 11. here for yearly check up. things are going well.     HPI: Randy Cantu is a 69 y.o. Caucasian, married patient and retired Academic librarian at [employer].  RV 11-27-2019:  Patient's wife still works 3 days a week, son is ready to take the practice. Patient has had Covid 19 in late November, was very sick. He is here for a RV.  Has REM-BD, had more sleep talking at about 5 AM- smacks his hand to the side of the bed.   RV on 11-20-2018, Epworth is 14, FSS is 22 points. Has planned a travel to [place] and will use Xanax on the flight.  He is seen here as a revisit  from Dr. Orland Mustard for persistent daytime sleepiness in the absence of apnea. The patient was evaluated in Dec 2013 for EDS, underwent a PSG on 10-17-12 - but had only frequent PLMs , not associated with a significant arousal index. His  AHI was low, (1.7) .  Dr. Sabra Heck endorsed  the Epworth sleepiness score at 16 points during our visit on 11-01-12. He had also reported diplopia intermittent and it was concerned that this may be related to his treatment with flecainide, also known as Tambocor, for his atrial fibrillation. His wife noted that he has been still acting out dreams behavior, but never leaving the bed. He had been prescribed Klonopin which he felt had an allover positive effect-  he may have slept with a deeper or more restorative quality. Unfortunately, his wife felt that he had personality changes and became more of a bruit or impulsive in response to taking the medication.Therefore he discontinued Klonopin. These REM behaviors have still continued, at times he may be a sleep talking sometimes he will cry out or holler loudly . He never leaves his bed , he sometimes thrashing about , often  talking. He  seems to produce these behaviors at 3-5 AM, not within the first 90 minutes of sleep.  This seems to be separate form : characterized as jerking upper movements, his wife has used the term myoclonus, twitching and jerking. The is occurs earlier with the first onset of sleep .  Interval history 12-09-14; Dr. Sabra Heck is seen here today in a regular follow-up he does have REM behavior disorder which is well controlled on Klonopin. He restarted Klonopin at 0.5 mg total at bedtime. The patient also suffers from atrial fibrillation, has not woken up with palpitations shortness of breath or any panic or breathing difficulties. His weight control was achieved with verapamil and flecainide. Headaches occur less than once a month and are really not in that timeframe or occurrence to use a preventive medication. He has not been acting out dreams or been sleep talking or loudly snoring at all since his last visit here. Today is a re-visit with routine refills.  Interval history 12-08-15 Ongoing twitching at night, about once ever moth a REM enactment. Vivid dreams. No parkinsonism. Bought a new mattress and would like to go off medication, now that movements not longer bother his wife.    Interval history from 08/21/2017, I have the pleasure of seeing Randy Cantu today following up on his REM behavior disorder, diagnosed about 5 years ago as well as refilling his Klonopin  as needed. He remains with a higher than average daytime sleepiness score at 15 and 16 points out of 24 ( Epworth). He drinks coffee or soda, pulse rate remains in the 50's.  Review of Systems: Out of a complete 14 system review, the patient complains of only the following symptoms, and all other reviewed systems are negative. How likely are you to doze in the following situations: 0 = not likely, 1 = slight chance, 2 = moderate chance, 3 = high chance  Sitting and Reading?2 Watching Television?1 Sitting inactive in a public place  (theater or meeting)?3 Lying down in the afternoon when circumstances permit?2 Sitting and talking to someone?3 Sitting quietly after lunch without alcohol?1 In a car, while stopped for a few minutes in traffic?1 As a passenger in a car for an hour without a break?2  Total = 15/ 24     Social History   Socioeconomic History  . Marital status: Married    Spouse name: Not on file  . Number of children: 2  . Years of education: Not on file  . Highest education level: Not on file  Occupational History  . Occupation: DOCTOR/EYE    Employer: SELF EMPLOYED  Tobacco Use  . Smoking status: Never Smoker  . Smokeless tobacco: Never Used  Substance and Sexual Activity  . Alcohol use: Yes    Alcohol/week: 7.0 standard drinks    Types: 7 Glasses of wine per week  . Drug use: No  . Sexual activity: Not on file  Other Topics Concern  . 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   Social Determinants of Health   Financial Resource Strain:   . Difficulty of Paying Living Expenses: Not on file  Food Insecurity:   . Worried About Charity fundraiser in the Last Year: Not on file  . Ran Out of Food in the Last Year: Not on file  Transportation Needs:   . Lack of Transportation (Medical): Not on file  . Lack of Transportation (Non-Medical): Not on file  Physical Activity:   . Days of Exercise per Week: Not on file  . Minutes of Exercise per Session: Not on file  Stress:   . Feeling of Stress : Not on file  Social Connections:   . Frequency of Communication with Friends and Family: Not on file  . Frequency of Social Gatherings with Friends and Family: Not on file  . Attends Religious Services: Not on file  . Active Member of Clubs or Organizations: Not on file  . Attends Archivist Meetings: Not on file  . Marital Status: Not on file  Intimate Partner Violence:   . Fear of Current or  Ex-Partner: Not on file  . Emotionally Abused: Not on file  . Physically Abused: Not on file  . Sexually Abused: Not on file    Family History  Problem Relation Age of Onset  . Peripheral vascular disease Mother        afib in her 11s  . Aplastic anemia Father   . Cancer Other   . Prostate cancer Maternal Grandfather   . Leukemia Paternal Grandfather   . Colon cancer Neg Hx     Past Medical History:  Diagnosis Date  . Allergic rhinitis   . HLD (hyperlipidemia)   . Lactose intolerance   . PAF (paroxysmal atrial fibrillation) ([facility])    s/p afib ablation  11/17/10  . Periodic limb movement disorder (PLMD)   . Right foot pain    all of foot 1--/2016    Past Surgical History:  Procedure Laterality Date  . atrial fibrillation ablation  11/17/10   PVI by Dr Rayann Heman  . TONSILLECTOMY      Current Outpatient Medications  Medication Sig Dispense Refill  . apixaban (ELIQUIS) 5 MG TABS tablet Take 1 tablet (5 mg total) by mouth 2 (two) times daily. (Patient taking differently: Take 2.5 mg by mouth 2 (two) times daily. ) 180 tablet 3  . clonazePAM (KLONOPIN) 0.5 MG tablet TAKE 1 AND 1/2 TABLETS BY MOUTH AT NIGHT 135 tablet 1  . diltiazem (CARDIZEM CD) 120 MG 24 hr capsule TAKE 1 CAPSULE(120 MG) BY MOUTH DAILY 90 capsule 3  . flecainide (TAMBOCOR) 100 MG tablet Take 1 tablet (100 mg total) by mouth 2 (two) times daily. 180 tablet 3  . predniSONE (DELTASONE) 50 MG tablet 1 tablet by mouth daily (Patient taking differently: 1 tablet by mouth daily for 5 days then stop) 5 tablet 0  . rosuvastatin (CRESTOR) 10 MG tablet Take 1 tablet (10 mg total) by mouth daily. 90 tablet 3   No current facility-administered medications for this visit.    Allergies as of 11/27/2019 - Review Complete 11/27/2019  Allergen Reaction Noted  . Pantoprazole sodium  01/04/2011    Vitals: BP 121/69   Pulse (!) 57   Temp 98.1 F (36.7 C)   Ht 6\' 1"  (1.854 m)   Wt 213 lb (96.6 kg)   BMI 28.10 kg/m  Last  Weight:  Wt Readings from Last 1 Encounters:  11/27/19 213 lb (96.6 kg)   Last Height:   Ht Readings from Last 1 Encounters:  11/27/19 6\' 1"  (1.854 m)    Physical exam:  General: The patient is awake, alert and appears not in acute distress. The patient is well groomed. Head: Normocephalic, atraumatic. Neck is supple. Mallampati 2, neck circumference:17 inches  .  Cardiovascular:  Regular rate and rhythm, slowed- but not symptomatic-without  murmurs or carotid bruit, and without distended neck veins. Respiratory: wheezing, pain with deep inspiration, coughing.COVID residual.  Skin:  Without evidence of edema, or rash Trunk: has normal posture.  Neurologic exam : The patient is awake and alert, oriented to place and time. Memory subjective  described as intact.  There is a normal attention span & concentration ability. Speech is fluent without dysarthria, dysphonia or aphasia.  Mood and affect are appropriate.  Cranial nerves: Pupils are equal.  Extraocular movements in vertical and horizontal planes intact and without nystagmus.  Hearing to finger rub intact. Facial sensation intact to fine touch.  Facial motor strength is symmetric and tongue and uvula move midline.  Motor exam:   Normal muscle bulk and symmetric normal strength in all extremities. Mild cog -wheeling over each biceps, very mild- unchanged  Coordination- very fine finger tremor in both hands, not PD like.  Gait and station: Patient walks without assistive device. Deep tendon reflexes: in the upper and lower extremities are symmetric and intact. Babinski deferred.  .  Assessment:  After physical and neurologic examination, review of laboratory studies, imaging, neurophysiology testing and pre-existing records, assessment is  1) REM BD - klonipin. Well controlled on 0.5 mg nightly. He is taken 1.5 tab 2) EDS- continued, preceded atrial fib and related medication. Right now on prednisone.    Plan:  20 minute RV  -Treatment plan and additional workup :  Klonopin refilled ,   Larey Seat, MD

## 2019-12-05 ENCOUNTER — Other Ambulatory Visit: Payer: Self-pay

## 2019-12-05 ENCOUNTER — Ambulatory Visit (INDEPENDENT_AMBULATORY_CARE_PROVIDER_SITE_OTHER): Payer: PPO

## 2019-12-05 ENCOUNTER — Encounter: Payer: Self-pay | Admitting: Pulmonary Disease

## 2019-12-05 ENCOUNTER — Ambulatory Visit: Payer: PPO | Admitting: Pulmonary Disease

## 2019-12-05 VITALS — BP 120/66 | HR 52 | Temp 97.9°F | Ht 73.0 in | Wt 213.8 lb

## 2019-12-05 DIAGNOSIS — R0602 Shortness of breath: Secondary | ICD-10-CM | POA: Diagnosis not present

## 2019-12-05 DIAGNOSIS — R0600 Dyspnea, unspecified: Secondary | ICD-10-CM | POA: Diagnosis not present

## 2019-12-05 DIAGNOSIS — R0609 Other forms of dyspnea: Secondary | ICD-10-CM

## 2019-12-05 MED ORDER — PREDNISONE 10 MG PO TABS: 40.0000 mg | ORAL_TABLET | Freq: Every day | ORAL | 0 refills | Status: DC

## 2019-12-05 NOTE — Telephone Encounter (Signed)
I discussed with patient and sent a prescription of prednisone to the pharmacy.

## 2019-12-05 NOTE — Progress Notes (Addendum)
Randy Cantu    [id number]    1951-03-13  Primary Care Physician:Morrow, Marjory Lies, MD  Referring Physician: [name], MD [place],  [place] [address]  Chief complaint: Follow-up for post COVID-72  HPI: 69 year old with atrial fibrillation, hyperlipidemia, allergies Diagnosed with COVID-19 on October 12, 2020.  He did not require hospitalization and was treated with bamlanimivab infusion as an outpatient.  States that since then his fever has resolved but has persistent dyspnea on exertion, cough which is nonproductive in nature. Steroids have been suggested for treatment.  He discussed this with his primary care doctor who is referred to pulmonary clinic for evaluation.  Reviewed note from Dr. Orland Mustard, primary care which notes persistent dyspnea, cough Is also being evaluated for dermatitis with triamcinolone cream.  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  He has no feather pillow and comforter Smoking history: Never smoked Travel history: No significant smoking Relevant family history: No significant family history of lung disease  Outpatient Encounter Medications as of 12/05/2019  Medication Sig  . apixaban (ELIQUIS) 5 MG TABS tablet Take 1 tablet (5 mg total) by mouth 2 (two) times daily. (Patient taking differently: Take 2.5 mg by mouth 2 (two) times daily. )  . clonazePAM (KLONOPIN) 0.5 MG tablet Take 2 tablets (1 mg total) by mouth at bedtime.  Marland Kitchen diltiazem (CARDIZEM CD) 120 MG 24 hr capsule TAKE 1 CAPSULE(120 MG) BY MOUTH DAILY  . flecainide (TAMBOCOR) 100 MG tablet Take 1 tablet (100 mg total) by mouth 2 (two) times daily.  . rosuvastatin (CRESTOR) 10 MG tablet Take 1 tablet (10 mg total) by mouth daily.  . predniSONE (DELTASONE) 50 MG tablet 1 tablet by  mouth daily (Patient not taking: Reported on 12/05/2019)   No facility-administered encounter medications on file as of 12/05/2019.    Allergies as of 12/05/2019 - Review Complete 12/05/2019  Allergen Reaction Noted  . Pantoprazole sodium  01/04/2011    Past Medical History:  Diagnosis Date  . Allergic rhinitis   . HLD (hyperlipidemia)   . Lactose intolerance   . PAF (paroxysmal atrial fibrillation) ([place])    s/p afib ablation 11/17/10  . Periodic limb movement disorder (PLMD)   . Right foot pain    all of foot 1--/2016    Past Surgical History:  Procedure Laterality Date  . atrial fibrillation ablation  11/17/10   PVI by Dr Rayann Heman  . TONSILLECTOMY      Family History  Problem Relation Age of Onset  . Peripheral vascular disease Mother        afib in her 62s  . Aplastic anemia Father   . Cancer Other   . Prostate cancer Maternal Grandfather   . Leukemia Paternal Grandfather   . Colon cancer Neg Hx     Social History   Socioeconomic History  . Marital status: Married    Spouse name: Not on file  . Number of children: 2  . Years of education: Not on file  . Highest education level: Not on file  Occupational History  . Occupation: DOCTOR/EYE    Employer: SELF EMPLOYED  Tobacco Use  . Smoking status: Never Smoker  . Smokeless tobacco: Never Used  Substance and Sexual Activity  . Alcohol use: Yes    Alcohol/week: 7.0  standard drinks    Types: 7 Glasses of wine per week  . Drug use: No  . Sexual activity: Not on file  Other Topics Concern  . 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   Social Determinants of Health   Financial Resource Strain:   . Difficulty of Paying Living Expenses: Not on file  Food Insecurity:   . Worried About Charity fundraiser in the Last Year: Not on file  . Ran Out of Food in the Last Year: Not on file  Transportation Needs:   . Lack of  Transportation (Medical): Not on file  . Lack of Transportation (Non-Medical): Not on file  Physical Activity:   . Days of Exercise per Week: Not on file  . Minutes of Exercise per Session: Not on file  Stress:   . Feeling of Stress : Not on file  Social Connections:   . Frequency of Communication with Friends and Family: Not on file  . Frequency of Social Gatherings with Friends and Family: Not on file  . Attends Religious Services: Not on file  . Active Member of Clubs or Organizations: Not on file  . Attends Archivist Meetings: Not on file  . Marital Status: Not on file  Intimate Partner Violence:   . Fear of Current or Ex-Partner: Not on file  . Emotionally Abused: Not on file  . Physically Abused: Not on file  . Sexually Abused: Not on file    Review of systems: Review of Systems  Constitutional: Negative for fever and chills.  HENT: Negative.   Eyes: Negative for blurred vision.  Respiratory: as per HPI  Cardiovascular: Negative for chest pain and palpitations.  Gastrointestinal: Negative for vomiting, diarrhea, blood per rectum. Genitourinary: Negative for dysuria, urgency, frequency and hematuria.  Musculoskeletal: Negative for myalgias, back pain and joint pain.  Skin: Negative for itching and rash.  Neurological: Negative for dizziness, tremors, focal weakness, seizures and loss of consciousness.  Endo/Heme/Allergies: Negative for environmental allergies.  Psychiatric/Behavioral: Negative for depression, suicidal ideas and hallucinations.  All other systems reviewed and are negative.  Physical Exam: Blood pressure 120/66, pulse (!) 52, temperature 97.9 F (36.6 C), temperature source Temporal, height 6\' 1"  (1.854 m), weight 213 lb 12.8 oz (97 kg), SpO2 97 %. Gen:      No acute distress HEENT:  EOMI, sclera anicteric Neck:     No masses; no thyromegaly Lungs:    Clear to auscultation bilaterally; normal respiratory effort CV:         Regular rate and  rhythm; no murmurs Abd:      + bowel sounds; soft, non-tender; no palpable masses, no distension Ext:    No edema; adequate peripheral perfusion Skin:      Warm and dry; no rash Neuro: alert and oriented x 3 Psych: normal mood and affect  Data Reviewed: Left cheek skin biopsy 11/17/2019-granulomatous dermatitis.  PAS, GMS and AFB stains were negative.  Represent sarcoidosis  Assessment:  Post COVID-19 Overall he is improving but continues to have some dyspnea with nonproductive cough May have post COVID-19 pneumonitis, ILD Exposures noted for down pillows and comforters raising the possibility of hypersensitivity pneumonitis Also noted to have recent skin biopsy with findings suggestive of sarcoidosis.  We will get a chest x-ray today.  If abnormal then treat with prednisone for several weeks Schedule for high-resolution CT and PFTs to  evaluate interstitial lung disease, sarcoidosis  Encouraged him to start a gradual exercise program as it may take a while for him to improve back to baseline.  Plan/Recommendations: - Chest x-ray today - Schedule PFTs, high-res CT  Marshell Garfinkel MD [facility] 12/05/2019, 9:59 AM  CC: [name], MD

## 2019-12-05 NOTE — Telephone Encounter (Signed)
mychart message sent by pt requesting the results of the cxr which was performed today 1/29. Pt has seen the results as it came through to his mychart and based on the results, pt wants to know if he might need to begin steroids.  Dr. Vaughan Browner, please advise on this for pt.

## 2019-12-05 NOTE — Patient Instructions (Signed)
We will get a chest x-ray today Schedule high-res CT and pulmonary function test Follow-up in 2 to 4 weeks.

## 2019-12-05 NOTE — Addendum Note (Signed)
Addended byMarshell Garfinkel on: 12/05/2019 02:19 PM   Modules accepted: Level of Service

## 2019-12-23 ENCOUNTER — Other Ambulatory Visit: Payer: Self-pay | Admitting: Internal Medicine

## 2019-12-23 ENCOUNTER — Ambulatory Visit: Payer: PPO | Admitting: Family Medicine

## 2019-12-26 ENCOUNTER — Telehealth: Payer: Self-pay | Admitting: Pulmonary Disease

## 2019-12-26 DIAGNOSIS — R0602 Shortness of breath: Secondary | ICD-10-CM

## 2019-12-26 NOTE — Telephone Encounter (Signed)
I called patient to see how he is doing.  He feels much better on prednisone with improving dyspnea and energy He was supposed to get high-res CT schedule but has not been done yet  Erica-can you schedule CT and follow-up with me in the next week or 2?  Thanks  Marshell Garfinkel MD [facility] 12/26/2019, 10:00 AM

## 2019-12-29 NOTE — Telephone Encounter (Signed)
So sorry I did not realize that the patient did not have an order. I misread the message. I have placed the order for it to be scheduled.

## 2019-12-29 NOTE — Telephone Encounter (Signed)
I did not see an order for a CT or a HRCT for this patient.

## 2019-12-29 NOTE — Telephone Encounter (Signed)
Hello Ladies can you all check into this CT scan scheduling and let me know so that I can schedule him to come into the office to see Dr. Vaughan Browner. Thank you

## 2020-01-08 ENCOUNTER — Ambulatory Visit
Admission: RE | Admit: 2020-01-08 | Discharge: 2020-01-08 | Disposition: A | Discharge status: Home / Self Care | Payer: Medicare Other | Source: Ambulatory Visit | Attending: Pulmonary Disease | Admitting: Pulmonary Disease

## 2020-01-08 DIAGNOSIS — Z8619 Personal history of other infectious and parasitic diseases: Secondary | ICD-10-CM | POA: Diagnosis not present

## 2020-01-08 DIAGNOSIS — R0602 Shortness of breath: Secondary | ICD-10-CM

## 2020-01-12 ENCOUNTER — Telehealth: Payer: Self-pay | Admitting: Neurology

## 2020-01-12 NOTE — Telephone Encounter (Signed)
rep with [facility] called to ask a couple of   questions about pts clonazepam wanting to confirm  the patients dosage changed and date when changed

## 2020-01-13 ENCOUNTER — Telehealth: Payer: Self-pay

## 2020-01-13 DIAGNOSIS — R918 Other nonspecific abnormal finding of lung field: Secondary | ICD-10-CM

## 2020-01-13 DIAGNOSIS — R0609 Other forms of dyspnea: Secondary | ICD-10-CM

## 2020-01-13 MED ORDER — SULFAMETHOXAZOLE-TRIMETHOPRIM 800-160 MG PO TABS: 1.0000 | ORAL_TABLET | ORAL | 3 refills | Status: DC

## 2020-01-13 NOTE — Telephone Encounter (Signed)
-----   Message from Marshell Garfinkel, MD sent at 01/13/2020  4:36 PM EST ----- The CT scan show mild changes consistent with evolving COVID infection.We can start coming down on the prednisone.  Please instruct to start coming down on prednisone by 10 mg every 2 weeks. Start him on Bactrim double strength 3 times a week for pneumonia prophylaxis  In addition CT also shows some suggestion of pulmonary hypertension.  Order echocardiogram

## 2020-01-14 ENCOUNTER — Ambulatory Visit: Payer: PPO | Admitting: Family Medicine

## 2020-01-14 ENCOUNTER — Encounter: Payer: Self-pay | Admitting: Family Medicine

## 2020-01-14 ENCOUNTER — Other Ambulatory Visit: Payer: Self-pay

## 2020-01-14 DIAGNOSIS — S46012A Strain of muscle(s) and tendon(s) of the rotator cuff of left shoulder, initial encounter: Secondary | ICD-10-CM
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
## 2020-01-14 NOTE — Telephone Encounter (Signed)
I called [facility] to give when clonazepam dosage was change. I gave date of November 27, 2019 and gave directions per doctors note. Melody pharmacist verbalized understanding.

## 2020-01-14 NOTE — Patient Instructions (Signed)
Pennsaid 2x daily for next two weeks Ice every night Continue exercises See me again in 5 weeks

## 2020-01-26 ENCOUNTER — Other Ambulatory Visit (HOSPITAL_COMMUNITY)
Admission: RE | Admit: 2020-01-26 | Discharge: 2020-01-26 | Disposition: A | Discharge status: Home / Self Care | Payer: PPO | Source: Ambulatory Visit | Attending: Pulmonary Disease | Admitting: Pulmonary Disease

## 2020-01-26 DIAGNOSIS — Z20822 Contact with and (suspected) exposure to covid-19: Secondary | ICD-10-CM | POA: Diagnosis not present

## 2020-01-26 DIAGNOSIS — Z01812 Encounter for preprocedural laboratory examination: Secondary | ICD-10-CM | POA: Insufficient documentation

## 2020-01-27 LAB — SARS CORONAVIRUS 2 (TAT 6-24 HRS): SARS Coronavirus 2: NEGATIVE

## 2020-01-29 ENCOUNTER — Other Ambulatory Visit: Payer: Self-pay

## 2020-01-29 ENCOUNTER — Ambulatory Visit: Payer: PPO | Admitting: Adult Health

## 2020-01-29 ENCOUNTER — Encounter: Payer: Self-pay | Admitting: Adult Health

## 2020-01-29 ENCOUNTER — Ambulatory Visit (INDEPENDENT_AMBULATORY_CARE_PROVIDER_SITE_OTHER): Payer: PPO | Admitting: Pulmonary Disease

## 2020-01-29 VITALS — BP 122/62 | HR 60 | Temp 96.7°F | Ht 73.0 in | Wt 215.0 lb

## 2020-01-29 DIAGNOSIS — R0600 Dyspnea, unspecified: Secondary | ICD-10-CM | POA: Diagnosis not present

## 2020-01-29 DIAGNOSIS — R0609 Other forms of dyspnea: Secondary | ICD-10-CM

## 2020-01-29 DIAGNOSIS — J8410 Pulmonary fibrosis, unspecified: Secondary | ICD-10-CM | POA: Diagnosis not present

## 2020-01-29 LAB — PULMONARY FUNCTION TEST
DL/VA % pred: 102 %
DL/VA: 4.14 ml/min/mmHg/L
DLCO cor % pred: 95 %
DLCO cor: 27.59 ml/min/mmHg
DLCO unc % pred: 95 %
DLCO unc: 27.59 ml/min/mmHg
FEF 25-75 Post: 3.97 L/sec
FEF 25-75 Pre: 3.92 L/sec
FEF2575-%Change-Post: 1 %
FEF2575-%Pred-Post: 136 %
FEF2575-%Pred-Pre: 134 %
FEV1-%Change-Post: 1 %
FEV1-%Pred-Post: 101 %
FEV1-%Pred-Pre: 99 %
FEV1-Post: 3.81 L
FEV1-Pre: 3.74 L
FEV1FVC-%Change-Post: 0 %
FEV1FVC-%Pred-Pre: 110 %
FEV6-%Change-Post: 1 %
FEV6-%Pred-Post: 95 %
FEV6-%Pred-Pre: 94 %
FEV6-Post: 4.6 L
FEV6-Pre: 4.54 L
FEV6FVC-%Change-Post: 0 %
FEV6FVC-%Pred-Post: 104 %
FEV6FVC-%Pred-Pre: 104 %
FVC-%Change-Post: 1 %
FVC-%Pred-Post: 91 %
FVC-%Pred-Pre: 90 %
FVC-Post: 4.64 L
FVC-Pre: 4.57 L
Post FEV1/FVC ratio: 82 %
Post FEV6/FVC ratio: 99 %
Pre FEV1/FVC ratio: 82 %
Pre FEV6/FVC Ratio: 99 %
RV % pred: 84 %
RV: 2.15 L
TLC % pred: 90 %
TLC: 6.87 L

## 2020-01-29 LAB — SEDIMENTATION RATE: Sed Rate: 2 mm/hr (ref 0–20)
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
## 2020-01-29 NOTE — Assessment & Plan Note (Signed)
Postinflammatory pulmonary fibrosis noted on CT chest most likely from evolving COVID-19 pneumonitis Clinically he is slowly improving.  Seem to have significant improvement since steroids. Pulmonary function testing is normal and DLCO is normal. O2 saturations are 99% on room air. He is continue to make clinical improvement since December 2020 with original COVID-19 diagnosis. We will check some basic routine autoimmune labs with ACE level, rheumatoid factor, ANA and ESR. We will follow closely.  And consider repeat CT chest in 6 to 12 months to follow stability and/or resolution.  Plan  Patient Instructions  Labs today .  Echo as planned.  Activity as tolerated.  Limit your to dust/mold /ash contact.  Follow up with Dr Vaughan Browner in 3 months and As needed

## 2020-01-29 NOTE — Patient Instructions (Addendum)
Labs today .  Echo as planned.  Activity as tolerated.  Limit your to dust/mold /ash contact.  Follow up with Dr Vaughan Browner in 3 months and As needed

## 2020-01-29 NOTE — Assessment & Plan Note (Signed)
Suspect is secondary to postinflammatory fibrosis on CT scan.  Pulmonary function testing shows normal lung function.  He is 3 months out from COVID-19 diagnosis.  He is having gradual clinical improvement. CT closely.  2D echo is pending.

## 2020-01-29 NOTE — Progress Notes (Signed)
PFT done today. 

## 2020-01-31 LAB — ANGIOTENSIN CONVERTING ENZYME: Angiotensin-Converting Enzyme: 58 U/L (ref 9–67)

## 2020-01-31 LAB — RHEUMATOID FACTOR: Rheumatoid fact SerPl-aCnc: <14 IU/mL (ref <14)

## 2020-01-31 LAB — ANA: Anti Nuclear Antibody (ANA): NEGATIVE

## 2020-02-03 ENCOUNTER — Other Ambulatory Visit: Payer: Self-pay

## 2020-02-03 DIAGNOSIS — Z8616 Personal history of COVID-19: Secondary | ICD-10-CM

## 2020-02-03 DIAGNOSIS — I272 Pulmonary hypertension, unspecified: Secondary | ICD-10-CM

## 2020-02-04 ENCOUNTER — Telehealth: Payer: Self-pay | Admitting: Adult Health

## 2020-02-04 NOTE — Telephone Encounter (Signed)
Pt was called earlier this morning for results. These were relayed to him by Horris Latino. Nothing further was needed.

## 2020-02-18 ENCOUNTER — Ambulatory Visit: Payer: PPO | Admitting: Family Medicine

## 2020-02-23 ENCOUNTER — Ambulatory Visit (HOSPITAL_COMMUNITY): Payer: PPO | Attending: Cardiovascular Disease

## 2020-02-23 ENCOUNTER — Other Ambulatory Visit: Payer: Self-pay

## 2020-02-23 DIAGNOSIS — I272 Pulmonary hypertension, unspecified: Secondary | ICD-10-CM | POA: Diagnosis not present

## 2020-02-23 DIAGNOSIS — Z8616 Personal history of COVID-19: Secondary | ICD-10-CM | POA: Diagnosis not present

## 2020-03-04 ENCOUNTER — Telehealth: Payer: Self-pay | Admitting: Internal Medicine

## 2020-03-04 DIAGNOSIS — I422 Other hypertrophic cardiomyopathy: Secondary | ICD-10-CM
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
## 2020-03-05 NOTE — Telephone Encounter (Signed)
THX SO MUCH

## 2020-03-10 ENCOUNTER — Other Ambulatory Visit: Payer: Self-pay

## 2020-03-10 DIAGNOSIS — I480 Paroxysmal atrial fibrillation: Secondary | ICD-10-CM

## 2020-03-10 MED ORDER — FLECAINIDE ACETATE 100 MG PO TABS: 100.0000 mg | ORAL_TABLET | Freq: Two times a day (BID) | ORAL | 0 refills | Status: DC

## 2020-03-16 NOTE — Telephone Encounter (Signed)
I called him over the weekend Can you arrange plz a cMRI for HCM

## 2020-03-19 NOTE — Telephone Encounter (Signed)
Order placed for cMRI as requested by Dr Caryl Comes.  Message sent to [facility]ert [facility] for scheduling.

## 2020-03-24 ENCOUNTER — Encounter: Payer: Self-pay | Admitting: Internal Medicine

## 2020-03-24 ENCOUNTER — Telehealth: Payer: Self-pay | Admitting: Internal Medicine

## 2020-03-24 NOTE — Telephone Encounter (Signed)
Left message for patient regarding appointment for Cardiac MRI scheduled Monday 04/12/20 at 9:00 am---arrival time is 8:15 am 1st floor admissions office at [facility]  Will mail information to patient and it is also available in My Chart.

## 2020-04-12 ENCOUNTER — Telehealth: Payer: Self-pay | Admitting: *Deleted

## 2020-04-12 ENCOUNTER — Ambulatory Visit (HOSPITAL_COMMUNITY): Payer: PPO

## 2020-04-12 ENCOUNTER — Encounter: Payer: Self-pay | Admitting: Internal Medicine

## 2020-04-12 NOTE — Telephone Encounter (Signed)
Left message for patient  Regarding appointment for Cardiac MRI scheduled Friday 05/07/20 at 9:00 am---arrival tim eis 8:15 am 1st floor admissions office---will mail information to patient and it is also in My Chart.

## 2020-04-27 ENCOUNTER — Other Ambulatory Visit: Payer: Self-pay | Admitting: Neurology

## 2020-04-29 DIAGNOSIS — S30860A Insect bite (nonvenomous) of lower back and pelvis, initial encounter: Secondary | ICD-10-CM | POA: Diagnosis not present

## 2020-04-29 DIAGNOSIS — W57XXXA Bitten or stung by nonvenomous insect and other nonvenomous arthropods, initial encounter: Secondary | ICD-10-CM | POA: Diagnosis not present

## 2020-05-05 DIAGNOSIS — N5203 Combined arterial insufficiency and corporo-venous occlusive erectile dysfunction: Secondary | ICD-10-CM | POA: Diagnosis not present

## 2020-05-05 DIAGNOSIS — N400 Enlarged prostate without lower urinary tract symptoms: Secondary | ICD-10-CM | POA: Diagnosis not present

## 2020-05-05 DIAGNOSIS — Z125 Encounter for screening for malignant neoplasm of prostate: Secondary | ICD-10-CM | POA: Diagnosis not present

## 2020-05-06 ENCOUNTER — Telehealth (HOSPITAL_COMMUNITY): Payer: Self-pay | Admitting: *Deleted

## 2020-05-06 NOTE — Telephone Encounter (Signed)
Reaching out to patient to offer assistance regarding upcoming cardiac imaging study; pt verbalizes understanding of appt date/time, parking situation and where to check in, pre-test medications, and verified current allergies; name and call back number provided for further questions should they arise  [facility] [phone] office [phone] cell

## 2020-05-07 ENCOUNTER — Other Ambulatory Visit: Payer: Self-pay

## 2020-05-07 ENCOUNTER — Ambulatory Visit (HOSPITAL_COMMUNITY)
Admission: RE | Admit: 2020-05-07 | Discharge: 2020-05-07 | Disposition: A | Discharge status: Home / Self Care | Payer: PPO | Source: Ambulatory Visit | Attending: Internal Medicine | Admitting: Internal Medicine

## 2020-05-07 DIAGNOSIS — I422 Other hypertrophic cardiomyopathy: Secondary | ICD-10-CM | POA: Insufficient documentation

## 2020-05-07 DIAGNOSIS — I517 Cardiomegaly: Secondary | ICD-10-CM | POA: Diagnosis not present

## 2020-05-07 MED ORDER — GADOBUTROL 1 MMOL/ML IV SOLN
10.0000 mL | Freq: Once | INTRAVENOUS | Status: AC | PRN
  Administered 2020-05-07: 10 mL via INTRAVENOUS

## 2020-05-21 ENCOUNTER — Ambulatory Visit: Payer: PPO | Admitting: Internal Medicine

## 2020-05-21 ENCOUNTER — Encounter: Payer: Self-pay | Admitting: Internal Medicine

## 2020-05-21 ENCOUNTER — Other Ambulatory Visit: Payer: Self-pay

## 2020-05-21 VITALS — BP 98/62 | HR 55 | Ht 73.0 in | Wt 217.0 lb

## 2020-05-21 DIAGNOSIS — I480 Paroxysmal atrial fibrillation: Secondary | ICD-10-CM | POA: Diagnosis not present

## 2020-05-21 NOTE — Patient Instructions (Signed)

## 2020-05-21 NOTE — Progress Notes (Addendum)
Patient Care Team: [name], MD as PCP - General (Family Medicine) [name], MD as Consulting Physician (Cardiology)   HPI  Randy Cantu is a 69 y.o. male Seen in follow-up for atrial fibrillation managed with a combination of verapamil and flecainide.  He is status post PVI 1/12-JA  DATE PR interval QRSduration Flecainide HR   2009 19 90 0   12/18 208 108 150   6/19 222 114 150 56  6/20 224 106 150 44   No interval atrial fibrillation as long as he takes medicines regularly  The patient denies chest pain, shortness of breath, nocturnal dyspnea, orthopnea or peripheral edema.  There have been no palpitations, lightheadedness or syncope.    On Apixoban  ;  No bleeding issues   DATE TEST EF   24/21 Echo   65 % Asymmetric septal hypertrophy 17/11;  BAE mild PA mildly elevated   7/21 cMRI  67 % Asymmetric Septal hypertrophy 66mm/7 mm LGE neg          to discuss cMRI results and afib management strategies, including repeat RFCA  Thromboembolic risk factors ( age -34) for a CHADSVASc Score of 1    Records and Results Reviewed  Past Medical History:  Diagnosis Date  . Allergic rhinitis   . HLD (hyperlipidemia)   . Lactose intolerance   . PAF (paroxysmal atrial fibrillation) ([place])    s/p afib ablation 11/17/10  . Periodic limb movement disorder (PLMD)   . Right foot pain    all of foot 1--/2016    Past Surgical History:  Procedure Laterality Date  . atrial fibrillation ablation  11/17/10   PVI by Dr Rayann Heman  . TONSILLECTOMY      Current Outpatient Medications  Medication Sig Dispense Refill  . apixaban (ELIQUIS) 5 MG TABS tablet Take 1 tablet (5 mg total) by mouth 2 (two) times daily. (Patient taking differently: Take 2.5 mg by mouth 2 (two) times daily. ) 180 tablet 3  . clonazePAM (KLONOPIN) 0.5 MG tablet Take 1 mg by mouth.    . diltiazem (CARDIZEM CD) 120 MG 24 hr capsule TAKE 1 CAPSULE(120 MG) BY MOUTH DAILY 90 capsule 3  . flecainide  (TAMBOCOR) 100 MG tablet Take 1.5 table(150mg ) in am and 1 tablet (100) in pm my mouth daily    . rosuvastatin (CRESTOR) 10 MG tablet Take 1 tablet (10 mg total) by mouth daily. 90 tablet 3   No current facility-administered medications for this visit.    Allergies  Allergen Reactions  . Pantoprazole Sodium     REACTION: rash from this or from  topical disinfectant      Review of Systems negative except from HPI and PMH  Physical Exam BP 98/62   Pulse (!) 55   Ht 6\' 1"  (1.854 m)   Wt 217 lb (98.4 kg)   SpO2 98%   BMI 28.63 kg/m  Well developed and nourished in no acute distress HENT normal Neck supple with JVP-  flat   Clear Regular rate and rhythm, no murmurs or gallops Abd-soft with active BS No Clubbing cyanosis edema Skin-warm and dry A & Oriented  Grossly normal sensory and motor function  ECG sinus at 44 Interval 22/11/49  Assessment and  Plan  Atrial fib paroxysmal  Sinus bradycardia  Hyperlipidemia  On Anticoagulation;  No bleeding issues   No intercurrent atrial fibrillation or flutter  Tolerating low dose statins  Discussed the role of catheter ablation-- he  is currently largely symptom free on flecainide and not sanguine given that he has had one failed procedure.  Much interval progression in technical skills and tools and he will think about having another conversation with JA.  We discussed CABANA znd the subsequently published propensity analysis , the first of which showed no reduction in hard endpoints the latter of which suggested that there was such a reduction; in this relationship acknowledged that JA is not inclined to use these data in discussing possible benefits of ablation  Also discussed cMRI and the important but not clearly diagnostic asymmetric septal hypertrophy   13 mm is accepted with genetesting and family hx but not by itself.  I have reached out to Dr Mina Marble for his insights and reviewed literature briefly    Discussed by txt  with DR Mina Marble who did not feel appropriate to presume a diagnosis of HCM, notwithstanding asymmetric septal hypertrophy

## 2020-06-09 DIAGNOSIS — I480 Paroxysmal atrial fibrillation: Secondary | ICD-10-CM

## 2020-06-09 DIAGNOSIS — E785 Hyperlipidemia, unspecified: Secondary | ICD-10-CM

## 2020-06-09 MED ORDER — FLECAINIDE ACETATE 100 MG PO TABS: ORAL_TABLET | ORAL | 3 refills | Status: DC

## 2020-06-09 MED ORDER — DILTIAZEM HCL ER COATED BEADS 120 MG PO CP24: 120.0000 mg | ORAL_CAPSULE | Freq: Every day | ORAL | 3 refills | Status: DC

## 2020-06-09 MED ORDER — APIXABAN 5 MG PO TABS: 5.0000 mg | ORAL_TABLET | Freq: Two times a day (BID) | ORAL | 3 refills | Status: DC

## 2020-06-09 MED ORDER — ROSUVASTATIN CALCIUM 10 MG PO TABS: 10.0000 mg | ORAL_TABLET | Freq: Every day | ORAL | 3 refills | Status: DC

## 2020-06-17 ENCOUNTER — Telehealth: Payer: Self-pay

## 2020-06-17 NOTE — Telephone Encounter (Signed)
-----   Message from [name], MD sent at 06/16/2020  2:21 PM EDT ----- Please Inform Patient that mild abnormality ispresent   reveiwed by way of informal consult with Dr Mina Marble at [facility] about the assymetric septal hypertrophy and HCM, and does not reach criteria of 15 mm and in the absence of family hx would not pursue  Thanks

## 2020-06-17 NOTE — Telephone Encounter (Signed)
Spoke with pt and advised per Dr Caryl Comes mild abnormality is present.  Reviewed with Dr Mina Marble at [facility] about the asymetric septal hypertrophy and HCM, does not meet the criteria of 11mm and with the absence of family history would not purse.  Pt verbalized understanding and thanked Therapist, sports for call.

## 2020-07-19 DIAGNOSIS — M9902 Segmental and somatic dysfunction of thoracic region: Secondary | ICD-10-CM | POA: Diagnosis not present

## 2020-07-19 DIAGNOSIS — M5032 Other cervical disc degeneration, mid-cervical region, unspecified level: Secondary | ICD-10-CM | POA: Diagnosis not present

## 2020-07-19 DIAGNOSIS — S39012A Strain of muscle, fascia and tendon of lower back, initial encounter: Secondary | ICD-10-CM | POA: Diagnosis not present

## 2020-07-19 DIAGNOSIS — S29012A Strain of muscle and tendon of back wall of thorax, initial encounter: Secondary | ICD-10-CM | POA: Diagnosis not present

## 2020-07-19 DIAGNOSIS — M9901 Segmental and somatic dysfunction of cervical region: Secondary | ICD-10-CM | POA: Diagnosis not present

## 2020-07-19 DIAGNOSIS — M9903 Segmental and somatic dysfunction of lumbar region: Secondary | ICD-10-CM | POA: Diagnosis not present

## 2020-07-22 DIAGNOSIS — M9901 Segmental and somatic dysfunction of cervical region: Secondary | ICD-10-CM | POA: Diagnosis not present

## 2020-07-22 DIAGNOSIS — S39012A Strain of muscle, fascia and tendon of lower back, initial encounter: Secondary | ICD-10-CM | POA: Diagnosis not present

## 2020-07-22 DIAGNOSIS — M5032 Other cervical disc degeneration, mid-cervical region, unspecified level: Secondary | ICD-10-CM | POA: Diagnosis not present

## 2020-07-22 DIAGNOSIS — M9903 Segmental and somatic dysfunction of lumbar region: Secondary | ICD-10-CM | POA: Diagnosis not present

## 2020-07-22 DIAGNOSIS — M9902 Segmental and somatic dysfunction of thoracic region: Secondary | ICD-10-CM | POA: Diagnosis not present

## 2020-07-22 DIAGNOSIS — S29012A Strain of muscle and tendon of back wall of thorax, initial encounter: Secondary | ICD-10-CM | POA: Diagnosis not present

## 2020-07-27 DIAGNOSIS — M9902 Segmental and somatic dysfunction of thoracic region: Secondary | ICD-10-CM | POA: Diagnosis not present

## 2020-07-27 DIAGNOSIS — M9903 Segmental and somatic dysfunction of lumbar region: Secondary | ICD-10-CM | POA: Diagnosis not present

## 2020-07-27 DIAGNOSIS — M5032 Other cervical disc degeneration, mid-cervical region, unspecified level: Secondary | ICD-10-CM | POA: Diagnosis not present

## 2020-07-27 DIAGNOSIS — S39012A Strain of muscle, fascia and tendon of lower back, initial encounter: Secondary | ICD-10-CM | POA: Diagnosis not present

## 2020-07-27 DIAGNOSIS — S29012A Strain of muscle and tendon of back wall of thorax, initial encounter: Secondary | ICD-10-CM | POA: Diagnosis not present

## 2020-07-27 DIAGNOSIS — M9901 Segmental and somatic dysfunction of cervical region: Secondary | ICD-10-CM | POA: Diagnosis not present

## 2020-09-01 ENCOUNTER — Other Ambulatory Visit: Payer: Self-pay | Admitting: Neurology

## 2020-09-01 MED ORDER — CLONAZEPAM 0.5 MG PO TABS: 0.7500 mg | ORAL_TABLET | Freq: Every day | ORAL | 2 refills | Status: DC

## 2020-09-01 NOTE — Telephone Encounter (Signed)
Pt request refill clonazePAM (KLONOPIN) 0.5 MG tablet at [facility] #[id number]

## 2020-09-01 NOTE — Addendum Note (Signed)
Addended by: Darleen Crocker on: 09/01/2020 11:31 AM   Modules accepted: Orders

## 2020-09-01 NOTE — Telephone Encounter (Signed)
I have routed this request to Dr Dohmeier for review. The pt is due for the medication and [place] registry was verified.  

## 2020-09-07 DIAGNOSIS — S29012A Strain of muscle and tendon of back wall of thorax, initial encounter: Secondary | ICD-10-CM | POA: Diagnosis not present

## 2020-09-07 DIAGNOSIS — M9901 Segmental and somatic dysfunction of cervical region: Secondary | ICD-10-CM | POA: Diagnosis not present

## 2020-09-07 DIAGNOSIS — M9903 Segmental and somatic dysfunction of lumbar region: Secondary | ICD-10-CM | POA: Diagnosis not present

## 2020-09-07 DIAGNOSIS — S39012A Strain of muscle, fascia and tendon of lower back, initial encounter: Secondary | ICD-10-CM | POA: Diagnosis not present

## 2020-09-07 DIAGNOSIS — M9902 Segmental and somatic dysfunction of thoracic region: Secondary | ICD-10-CM | POA: Diagnosis not present

## 2020-09-07 DIAGNOSIS — M5032 Other cervical disc degeneration, mid-cervical region, unspecified level: Secondary | ICD-10-CM | POA: Diagnosis not present

## 2020-09-09 DIAGNOSIS — S39012A Strain of muscle, fascia and tendon of lower back, initial encounter: Secondary | ICD-10-CM | POA: Diagnosis not present

## 2020-09-09 DIAGNOSIS — M9903 Segmental and somatic dysfunction of lumbar region: Secondary | ICD-10-CM | POA: Diagnosis not present

## 2020-09-09 DIAGNOSIS — M9902 Segmental and somatic dysfunction of thoracic region: Secondary | ICD-10-CM | POA: Diagnosis not present

## 2020-09-09 DIAGNOSIS — S29012A Strain of muscle and tendon of back wall of thorax, initial encounter: Secondary | ICD-10-CM | POA: Diagnosis not present

## 2020-09-09 DIAGNOSIS — M5032 Other cervical disc degeneration, mid-cervical region, unspecified level: Secondary | ICD-10-CM | POA: Diagnosis not present

## 2020-09-09 DIAGNOSIS — M9901 Segmental and somatic dysfunction of cervical region: Secondary | ICD-10-CM | POA: Diagnosis not present

## 2020-09-16 DIAGNOSIS — M5032 Other cervical disc degeneration, mid-cervical region, unspecified level: Secondary | ICD-10-CM | POA: Diagnosis not present

## 2020-09-16 DIAGNOSIS — S29012A Strain of muscle and tendon of back wall of thorax, initial encounter: Secondary | ICD-10-CM | POA: Diagnosis not present

## 2020-09-16 DIAGNOSIS — M9901 Segmental and somatic dysfunction of cervical region: Secondary | ICD-10-CM | POA: Diagnosis not present

## 2020-09-16 DIAGNOSIS — M9902 Segmental and somatic dysfunction of thoracic region: Secondary | ICD-10-CM | POA: Diagnosis not present

## 2020-09-16 DIAGNOSIS — S39012A Strain of muscle, fascia and tendon of lower back, initial encounter: Secondary | ICD-10-CM | POA: Diagnosis not present

## 2020-09-16 DIAGNOSIS — M9903 Segmental and somatic dysfunction of lumbar region: Secondary | ICD-10-CM | POA: Diagnosis not present

## 2020-09-23 DIAGNOSIS — S39012A Strain of muscle, fascia and tendon of lower back, initial encounter: Secondary | ICD-10-CM | POA: Diagnosis not present

## 2020-09-23 DIAGNOSIS — S29012A Strain of muscle and tendon of back wall of thorax, initial encounter: Secondary | ICD-10-CM | POA: Diagnosis not present

## 2020-09-23 DIAGNOSIS — M5032 Other cervical disc degeneration, mid-cervical region, unspecified level: Secondary | ICD-10-CM | POA: Diagnosis not present

## 2020-09-23 DIAGNOSIS — M9903 Segmental and somatic dysfunction of lumbar region: Secondary | ICD-10-CM | POA: Diagnosis not present

## 2020-09-23 DIAGNOSIS — M9901 Segmental and somatic dysfunction of cervical region: Secondary | ICD-10-CM | POA: Diagnosis not present

## 2020-09-23 DIAGNOSIS — M9902 Segmental and somatic dysfunction of thoracic region: Secondary | ICD-10-CM | POA: Diagnosis not present

## 2020-10-12 DIAGNOSIS — D485 Neoplasm of uncertain behavior of skin: Secondary | ICD-10-CM | POA: Diagnosis not present

## 2020-10-12 DIAGNOSIS — L718 Other rosacea: Secondary | ICD-10-CM | POA: Diagnosis not present

## 2020-10-12 DIAGNOSIS — L821 Other seborrheic keratosis: Secondary | ICD-10-CM | POA: Diagnosis not present

## 2020-10-12 DIAGNOSIS — D225 Melanocytic nevi of trunk: Secondary | ICD-10-CM | POA: Diagnosis not present

## 2020-11-17 ENCOUNTER — Encounter: Payer: Self-pay | Admitting: Neurology

## 2020-11-17 ENCOUNTER — Ambulatory Visit: Payer: PPO | Admitting: Neurology

## 2020-11-17 VITALS — BP 117/73 | HR 50 | Ht 73.0 in | Wt 212.0 lb

## 2020-11-17 DIAGNOSIS — I422 Other hypertrophic cardiomyopathy: Secondary | ICD-10-CM | POA: Insufficient documentation

## 2020-11-17 DIAGNOSIS — J8410 Pulmonary fibrosis, unspecified: Secondary | ICD-10-CM | POA: Diagnosis not present

## 2020-11-17 DIAGNOSIS — I480 Paroxysmal atrial fibrillation: Secondary | ICD-10-CM | POA: Diagnosis not present

## 2020-11-17 DIAGNOSIS — G478 Other sleep disorders: Secondary | ICD-10-CM | POA: Diagnosis not present

## 2020-11-17 DIAGNOSIS — Z23 Encounter for immunization: Secondary | ICD-10-CM | POA: Insufficient documentation

## 2020-11-17 MED ORDER — CLONAZEPAM 0.5 MG PO TABS: 0.7500 mg | ORAL_TABLET | Freq: Every day | ORAL | 3 refills | Status: DC

## 2020-11-17 NOTE — Addendum Note (Signed)
Addended by: Larey Seat on: 11/17/2020 11:21 AM   Modules accepted: Orders
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
## 2020-11-17 NOTE — Patient Instructions (Signed)
Clonazepam tablets What is this medicine? CLONAZEPAM (kloe NA ze pam) is a benzodiazepine. It is used to treat certain types of seizures. It is also used to treat panic disorder. This medicine may be used for other purposes; ask your health care provider or pharmacist if you have questions. COMMON BRAND NAME(S): Ceberclon, Klonopin What should I tell my health care provider before I take this medicine? They need to know if you have any of these conditions:  an alcohol or drug abuse problem  bipolar disorder, depression, psychosis or other mental health condition  glaucoma  kidney or liver disease  lung or breathing disease  myasthenia gravis  Parkinson's disease  porphyria  seizures or a history of seizures  suicidal thoughts  an unusual or allergic reaction to clonazepam, other benzodiazepines, foods, dyes, or preservatives  pregnant or trying to get pregnant  breast-feeding How should I use this medicine? Take this medicine by mouth with a glass of water. Follow the directions on the prescription label. If it upsets your stomach, take it with food or milk. Take your medicine at regular intervals. Do not take it more often than directed. Do not stop taking or change the dose except on the advice of your doctor or health care professional. A special MedGuide will be given to you by the pharmacist with each prescription and refill. Be sure to read this information carefully each time. Talk to your pediatrician regarding the use of this medicine in children. Special care may be needed. Overdosage: If you think you have taken too much of this medicine contact a poison control center or emergency room at once. NOTE: This medicine is only for you. Do not share this medicine with others. What if I miss a dose? If you miss a dose, take it as soon as you can. If it is almost time for your next dose, take only that dose. Do not take double or extra doses. What may interact with this  medicine? Do not take this medication with any of the following medicines:  narcotic medicines for cough  sodium oxybate This medicine may also interact with the following medications:  alcohol  antihistamines for allergy, cough and cold  antiviral medicines for HIV or AIDS  certain medicines for anxiety or sleep  certain medicines for depression, like amitriptyline, fluoxetine, sertraline  certain medicines for fungal infections like ketoconazole and itraconazole  certain medicines for seizures like carbamazepine, phenobarbital, phenytoin, primidone  general anesthetics like halothane, isoflurane, methoxyflurane, propofol  local anesthetics like lidocaine, pramoxine, tetracaine  medicines that relax muscles for surgery  narcotic medicines for pain  phenothiazines like chlorpromazine, mesoridazine, prochlorperazine, thioridazine This list may not describe all possible interactions. Give your health care provider a list of all the medicines, herbs, non-prescription drugs, or dietary supplements you use. Also tell them if you smoke, drink alcohol, or use illegal drugs. Some items may interact with your medicine. What should I watch for while using this medicine? Tell your doctor or health care professional if your symptoms do not start to get better or if they get worse. Do not stop taking except on your doctor's advice. You may develop a severe reaction. Your doctor will tell you how much medicine to take. You may get drowsy or dizzy. Do not drive, use machinery, or do anything that needs mental alertness until you know how this medicine affects you. To reduce the risk of dizzy and fainting spells, do not stand or sit up quickly, especially if you are  an older patient. Alcohol may increase dizziness and drowsiness. Avoid alcoholic drinks. If you are taking another medicine that also causes drowsiness, you may have more side effects. Give your health care provider a list of all  medicines you use. Your doctor will tell you how much medicine to take. Do not take more medicine than directed. Call emergency for help if you have problems breathing or unusual sleepiness. The use of this medicine may increase the chance of suicidal thoughts or actions. Pay special attention to how you are responding while on this medicine. Any worsening of mood, or thoughts of suicide or dying should be reported to your health care professional right away. What side effects may I notice from receiving this medicine? Side effects that you should report to your doctor or health care professional as soon as possible:  allergic reactions like skin rash, itching or hives, swelling of the face, lips, or tongue  breathing problems  confusion  loss of balance or coordination  signs and symptoms of low blood pressure like dizziness; feeling faint or lightheaded, falls; unusually weak or tired  suicidal thoughts or mood changes Side effects that usually do not require medical attention (report to your doctor or health care professional if they continue or are bothersome):  dizziness  headache  tiredness  upset stomach This list may not describe all possible side effects. Call your doctor for medical advice about side effects. You may report side effects to FDA at 1-800-FDA-1088. Where should I keep my medicine? Keep out of the reach of children. This medicine can be abused. Keep your medicine in a safe place to protect it from theft. Do not share this medicine with anyone. Selling or giving away this medicine is dangerous and against the law. This medicine may cause accidental overdose and death if taken by other adults, children, or pets. Mix any unused medicine with a substance like cat litter or coffee grounds. Then throw the medicine away in a sealed container like a sealed bag or a coffee can with a lid. Do not use the medicine after the expiration date. Store at room temperature between  15 and 30 degrees C (59 and 86 degrees F). Protect from light. Keep container tightly closed. NOTE: This sheet is a summary. It may not cover all possible information. If you have questions about this medicine, talk to your doctor, pharmacist, or health care provider.  2021 Elsevier/Gold Standard (2016-03-31 18:46:32)  

## 2020-11-18 ENCOUNTER — Other Ambulatory Visit: Payer: Self-pay | Admitting: Neurology

## 2020-11-18 ENCOUNTER — Telehealth: Payer: Self-pay | Admitting: Internal Medicine

## 2020-11-18 DIAGNOSIS — D485 Neoplasm of uncertain behavior of skin: Secondary | ICD-10-CM | POA: Diagnosis not present

## 2020-11-18 DIAGNOSIS — L988 Other specified disorders of the skin and subcutaneous tissue: Secondary | ICD-10-CM | POA: Diagnosis not present

## 2020-11-18 DIAGNOSIS — I480 Paroxysmal atrial fibrillation: Secondary | ICD-10-CM

## 2020-11-18 MED ORDER — CLONAZEPAM 0.5 MG PO TABS: 0.7500 mg | ORAL_TABLET | Freq: Every day | ORAL | 1 refills | Status: DC

## 2020-11-18 NOTE — Addendum Note (Signed)
Addended by: Noberto Retort C on: 11/18/2020 04:15 PM   Modules accepted: Orders

## 2020-11-18 NOTE — Telephone Encounter (Signed)
Pt is requesting a 90-day supply be sent to mail order.  [place] narcotic registry checked. Last filled for #[id number] on 11/10/20.  Requesting early to provide mail order time to process.  Ok, per vo by Dr. Brett Fairy, to send her this request.

## 2020-11-18 NOTE — Telephone Encounter (Signed)
*  STAT* If patient is at the pharmacy, call can be transferred to refill team.   1. Which medications need to be refilled? (please list name of each medication and dose if known) apixaban (ELIQUIS) 2.5 MG tablet  2. Which pharmacy/location (including street and city if local pharmacy) is medication to be sent to? [facility] - [place], [address]  3. Do they need a 30 day or 90 day supply? 90 day supply  Patient states during his last appointment with Dr. Caryl Comes, he was advised that he could begin splitting his tablets in half and taking 2.5 MG twice daily. He states since that appointment, he has been taking 2.5 MG twice daily. He would like too know if he can be put on a 2.5 MG pill so that he no longer has to split them in half.

## 2020-11-18 NOTE — Telephone Encounter (Signed)
Pt is asking that his clonazePAM (KLONOPIN) 0.5 MG tablet be resent for  90 days and not 30 to [facility]

## 2020-11-18 NOTE — Telephone Encounter (Signed)
Will forward to Dr Caryl Comes to advise on what dosage of Eliquis pt should taking.  No documentation in Dr Olin Pia last [place] note from 05/21/20 advising pt to reduce his dosage of Eliquis to 2.5mg  BID.  Pt is 69, weight 96.2kg, last labs 10/24/18 Creat 1.2 at [facility] per [facility], pt is overdue for follow-up labwork. Based on specified criteria pt should be on Eliquis 5mg  BID.  Please advise what dosage of Eliquis pt should be taking. Thanks

## 2020-11-26 MED ORDER — APIXABAN 5 MG PO TABS: 5.0000 mg | ORAL_TABLET | Freq: Two times a day (BID) | ORAL | 1 refills | Status: DC

## 2020-11-26 NOTE — Telephone Encounter (Signed)
Called pt and left him a message advising him that we are waiting for MD response clarifying Eliquis dosing.

## 2020-11-26 NOTE — Telephone Encounter (Signed)
20 min discussion regarding anticoagulation and the benefits and risks of underdosing; he has decided based on the recommendation to resume 5mg  bid Apixoban

## 2020-11-29 ENCOUNTER — Telehealth: Payer: Self-pay | Admitting: Internal Medicine

## 2020-11-29 NOTE — Telephone Encounter (Signed)
Called and spoke to pt who stated that he did not have any questions on taking his Eliquis, he stated that he had a long discussion with Dr Caryl Comes and is going to be taking Eliquis 5mg , twice a day.   Called and spoke to pt's pharmacy and clarified that pt is to be on Eliquis 5mg , twice a day.

## 2020-11-29 NOTE — Telephone Encounter (Signed)
New Message:    He would like to clarify the dose of pt's Eliquis.

## 2021-03-04 ENCOUNTER — Other Ambulatory Visit: Payer: Self-pay

## 2021-03-04 DIAGNOSIS — E785 Hyperlipidemia, unspecified: Secondary | ICD-10-CM

## 2021-03-04 DIAGNOSIS — I480 Paroxysmal atrial fibrillation: Secondary | ICD-10-CM

## 2021-03-04 MED ORDER — ROSUVASTATIN CALCIUM 10 MG PO TABS: 10.0000 mg | ORAL_TABLET | Freq: Every day | ORAL | 0 refills | Status: DC

## 2021-03-04 MED ORDER — DILTIAZEM HCL ER COATED BEADS 120 MG PO CP24: 120.0000 mg | ORAL_CAPSULE | Freq: Every day | ORAL | 0 refills | Status: DC

## 2021-03-04 MED ORDER — FLECAINIDE ACETATE 100 MG PO TABS: ORAL_TABLET | ORAL | 0 refills | Status: DC

## 2021-03-04 NOTE — Addendum Note (Signed)
Addended by: Carter Kitten D on: 03/04/2021 01:30 PM   Modules accepted: Orders

## 2021-03-08 ENCOUNTER — Telehealth: Payer: Self-pay | Admitting: Podiatry

## 2021-03-08 NOTE — Telephone Encounter (Signed)
Pt left message asking about getting a new pair of orthotics, He has a couple pair from a few yrs ago. Not much has changed just orthotics are getting worn.  I returned call and left message that pt has to have an updated appt with Dr Paulla Dolly to get new orthotics since it has been a while since he was seen and we will need the office note to send to [employer] to get authorization. I asked him to call to schedule an appt.

## 2021-03-17 ENCOUNTER — Other Ambulatory Visit: Payer: Self-pay

## 2021-03-17 ENCOUNTER — Ambulatory Visit: Payer: PPO | Admitting: Podiatry

## 2021-03-17 ENCOUNTER — Encounter: Payer: Self-pay | Admitting: Podiatry

## 2021-03-17 DIAGNOSIS — G629 Polyneuropathy, unspecified: Secondary | ICD-10-CM

## 2021-03-17 DIAGNOSIS — K5900 Constipation, unspecified: Secondary | ICD-10-CM | POA: Insufficient documentation

## 2021-03-17 DIAGNOSIS — M779 Enthesopathy, unspecified: Secondary | ICD-10-CM | POA: Diagnosis not present

## 2021-03-17 NOTE — Progress Notes (Signed)
Subjective:   Patient ID: Randy Cantu, male   DOB: 70 y.o.   MRN: [id number]   HPI Patient presents stating that he needs new orthotics that he is experiencing diminished sensation in his digits that he wanted checked   ROS      Objective:  Physical Exam  Neurovascular status intact with patient found to have chronic forefoot capsulitis bilateral slight nail disease and also slight reduction of sensation in the lesser digits but not any balance issues or progression     Assessment:  Chronic inflammatory condition along with probability for low-grade neuropathy     Plan:  H&P reviewed condition and as long as neuropathy review makes low-grade do not recommend treatment and recommended new orthotics which will be sent off and made and also recovering his old ones at 1 point in the future  X-rays indicate bone structure still looks good not a big change from previous times with moderate flatness of the arch bilateral

## 2021-03-23 DIAGNOSIS — I4891 Unspecified atrial fibrillation: Secondary | ICD-10-CM | POA: Diagnosis not present

## 2021-03-23 DIAGNOSIS — U071 COVID-19: Secondary | ICD-10-CM | POA: Diagnosis not present

## 2021-03-29 DIAGNOSIS — M779 Enthesopathy, unspecified: Secondary | ICD-10-CM | POA: Diagnosis not present

## 2021-03-30 ENCOUNTER — Telehealth: Payer: Self-pay | Admitting: Podiatry

## 2021-03-30 NOTE — Telephone Encounter (Signed)
Orthotics in... pt aware ok to pick them up.

## 2021-05-04 DIAGNOSIS — M79672 Pain in left foot: Secondary | ICD-10-CM | POA: Diagnosis not present

## 2021-05-04 DIAGNOSIS — M79671 Pain in right foot: Secondary | ICD-10-CM | POA: Diagnosis not present

## 2021-05-04 DIAGNOSIS — R269 Unspecified abnormalities of gait and mobility: Secondary | ICD-10-CM | POA: Diagnosis not present

## 2021-05-12 ENCOUNTER — Other Ambulatory Visit: Payer: Self-pay

## 2021-05-12 ENCOUNTER — Other Ambulatory Visit: Payer: PPO

## 2021-05-13 ENCOUNTER — Other Ambulatory Visit: Payer: Self-pay | Admitting: *Deleted

## 2021-05-13 DIAGNOSIS — E785 Hyperlipidemia, unspecified: Secondary | ICD-10-CM

## 2021-05-13 DIAGNOSIS — I480 Paroxysmal atrial fibrillation: Secondary | ICD-10-CM

## 2021-05-13 MED ORDER — DILTIAZEM HCL ER COATED BEADS 120 MG PO CP24: 120.0000 mg | ORAL_CAPSULE | Freq: Every day | ORAL | 0 refills | Status: DC

## 2021-05-13 MED ORDER — FLECAINIDE ACETATE 100 MG PO TABS: ORAL_TABLET | ORAL | 0 refills | Status: DC

## 2021-05-13 MED ORDER — ROSUVASTATIN CALCIUM 10 MG PO TABS
10.0000 mg | ORAL_TABLET | Freq: Every day | ORAL | 0 refills | Status: DC
Start: 2021-05-13 — End: 2021-09-08

## 2021-05-13 NOTE — Telephone Encounter (Signed)
90 day supply sent in to the pharmacy for the following medications:  Diltiazem  Flecainide Rosuvastatin  Included notation patient must keep scheduled appointment for 06/24/2021

## 2021-05-18 DIAGNOSIS — M79671 Pain in right foot: Secondary | ICD-10-CM | POA: Diagnosis not present

## 2021-05-18 DIAGNOSIS — R269 Unspecified abnormalities of gait and mobility: Secondary | ICD-10-CM | POA: Diagnosis not present

## 2021-05-18 DIAGNOSIS — M79672 Pain in left foot: Secondary | ICD-10-CM | POA: Diagnosis not present

## 2021-05-19 ENCOUNTER — Other Ambulatory Visit: Payer: Self-pay | Admitting: Neurology

## 2021-05-19 MED ORDER — CLONAZEPAM 0.5 MG PO TABS: 0.7500 mg | ORAL_TABLET | Freq: Every day | ORAL | 1 refills | Status: DC

## 2021-05-19 NOTE — Telephone Encounter (Signed)
Pt called needing a refill on his clonazePAM (KLONOPIN) 0.5 MG tablet but would like it sent to the [facility] on [address].

## 2021-05-25 ENCOUNTER — Ambulatory Visit: Payer: PPO | Admitting: Internal Medicine

## 2021-06-03 DIAGNOSIS — M79671 Pain in right foot: Secondary | ICD-10-CM | POA: Diagnosis not present

## 2021-06-03 DIAGNOSIS — M79672 Pain in left foot: Secondary | ICD-10-CM | POA: Diagnosis not present

## 2021-06-03 DIAGNOSIS — R269 Unspecified abnormalities of gait and mobility: Secondary | ICD-10-CM | POA: Diagnosis not present

## 2021-06-06 DIAGNOSIS — R269 Unspecified abnormalities of gait and mobility: Secondary | ICD-10-CM | POA: Diagnosis not present

## 2021-06-06 DIAGNOSIS — M79672 Pain in left foot: Secondary | ICD-10-CM | POA: Diagnosis not present

## 2021-06-06 DIAGNOSIS — M79671 Pain in right foot: Secondary | ICD-10-CM | POA: Diagnosis not present

## 2021-06-24 ENCOUNTER — Ambulatory Visit: Payer: PPO | Admitting: Internal Medicine

## 2021-09-08 ENCOUNTER — Other Ambulatory Visit: Payer: Self-pay

## 2021-09-08 ENCOUNTER — Encounter: Payer: Self-pay | Admitting: Internal Medicine

## 2021-09-08 ENCOUNTER — Ambulatory Visit: Payer: PPO | Admitting: Internal Medicine

## 2021-09-08 VITALS — BP 122/70 | HR 50 | Ht 73.0 in | Wt 215.0 lb

## 2021-09-08 DIAGNOSIS — E785 Hyperlipidemia, unspecified: Secondary | ICD-10-CM | POA: Diagnosis not present

## 2021-09-08 DIAGNOSIS — Z79899 Other long term (current) drug therapy: Secondary | ICD-10-CM | POA: Diagnosis not present

## 2021-09-08 DIAGNOSIS — I422 Other hypertrophic cardiomyopathy: Secondary | ICD-10-CM | POA: Diagnosis not present

## 2021-09-08 DIAGNOSIS — I480 Paroxysmal atrial fibrillation: Secondary | ICD-10-CM

## 2021-09-08 MED ORDER — FLECAINIDE ACETATE 100 MG PO TABS: ORAL_TABLET | ORAL | 1 refills | Status: DC

## 2021-09-08 MED ORDER — DILTIAZEM HCL ER COATED BEADS 120 MG PO CP24: 120.0000 mg | ORAL_CAPSULE | Freq: Every day | ORAL | 1 refills | Status: DC

## 2021-09-08 MED ORDER — APIXABAN 5 MG PO TABS: 5.0000 mg | ORAL_TABLET | Freq: Two times a day (BID) | ORAL | 1 refills | Status: DC

## 2021-09-08 MED ORDER — ROSUVASTATIN CALCIUM 10 MG PO TABS: 10.0000 mg | ORAL_TABLET | Freq: Every day | ORAL | 1 refills | Status: DC

## 2021-09-08 NOTE — Progress Notes (Signed)
Patient Care Team: [name], MD as PCP - General (Family Medicine) [name], MD as Consulting Physician (Cardiology)   HPI  Randy Cantu is a 70 y.o. male Seen in follow-up for atrial fibrillation managed with a combination of verapamil and flecainide.  He is status post PVI 1/12-JA  DATE PR interval QRSduration Flecainide HR   2009 19 90 0   12/18 208 108 150   6/19 222 114 150 56  6/20 224 106 150 44  11/22 218 110 150 50   No interval atrial fibrillation of which she is aware.  The patient denies chest pain, shortness of breath, nocturnal dyspnea, orthopnea or peripheral edema.  There have been no palpitations, lightheadedness or syncope.    On Apixoban; no bleeding     DATE TEST EF   24/21 Echo   65 % Asymmetric septal hypertrophy 17/11;  BAE mild PA mildly elevated   7/21 cMRI  67 % Asymmetric Septal hypertrophy 13mm/7 mm LGE neg            Thromboembolic risk factors ( age -83) for a CHADSVASc Score of 1    Records and Results Reviewed  Past Medical History:  Diagnosis Date   Allergic rhinitis    HLD (hyperlipidemia)    Lactose intolerance    PAF (paroxysmal atrial fibrillation) (HCC)    s/p afib ablation 11/17/10   Periodic limb movement disorder (PLMD)    Right foot pain    all of foot 1--/2016    Past Surgical History:  Procedure Laterality Date   atrial fibrillation ablation  11/17/10   PVI by Dr Rayann Heman   TONSILLECTOMY      Current Outpatient Medications  Medication Sig Dispense Refill   clonazePAM (KLONOPIN) 0.5 MG tablet Take 1.5 tablets (0.75 mg total) by mouth at bedtime. 135 tablet 1   levocetirizine (XYZAL) 5 MG tablet Take 5 mg by mouth as needed for allergies.     MAGNESIUM PO Take 750 mg by mouth as needed (constipation).     apixaban (ELIQUIS) 5 MG TABS tablet Take 1 tablet (5 mg total) by mouth 2 (two) times daily. 180 tablet 1   diltiazem (CARDIZEM CD) 120 MG 24 hr capsule Take 1 capsule (120 mg total) by mouth  daily. 90 capsule 1   flecainide (TAMBOCOR) 100 MG tablet Take 1.5 tablets in am and 1 tablet (100) in pm my mouth daily. 225 tablet 1   rosuvastatin (CRESTOR) 10 MG tablet Take 1 tablet (10 mg total) by mouth daily. 90 tablet 1   No current facility-administered medications for this visit.    Allergies  Allergen Reactions   Pantoprazole Sodium     REACTION: rash from this or from  topical disinfectant      Review of Systems negative except from HPI and PMH  Physical Exam BP 122/70   Pulse (!) 50   Ht 6\' 1"  (1.854 m)   Wt 215 lb (97.5 kg)   SpO2 97%   BMI 28.37 kg/m  Well developed and nourished in no acute distress HENT normal Neck supple with JVP-  flat  Clear Regular rate and rhythm, no murmurs or gallops Abd-soft with active BS No Clubbing cyanosis edema Skin-warm and dry A & Oriented  Grossly normal sensory and motor function  ECG sinus at 50 Intervals 21/11/41.      Assessment and  Plan  Atrial fib paroxysmal  Sinus bradycardia  Hyperlipidemia  Septal hypertrophy  No interval atrial fibrillation.  We will continue the flecainide at 150/100.  No bleeding.  We will continue the Eliquis at 5 mg twice daily.  Threshold check CBC and renal function  For now we will continue with Crestor at 10 mg a day.  We will check his LDL.

## 2021-09-08 NOTE — Patient Instructions (Signed)
Medication Instructions:  Your physician recommends that you continue on your current medications as directed. Please refer to the Current Medication list given to you today.  *If you need a refill on your cardiac medications before your next appointment, please call your pharmacy*   Lab Work: CBC, CMET and FLP If you have labs (blood work) drawn today and your tests are completely normal, you will receive your results only by: [place] (if you have MyChart) OR A paper copy in the mail If you have any lab test that is abnormal or we need to change your treatment, we will call you to review the results.   Testing/Procedures: None ordered.    Follow-Up: At [facility], you and your health needs are our priority.  As part of our continuing mission to provide you with exceptional heart care, we have created designated Provider Care Teams.  These Care Teams include your primary Cardiologist (physician) and Advanced Practice Providers (APPs -  Physician Assistants and Nurse Practitioners) who all work together to provide you with the care you need, when you need it.  We recommend signing up for the patient portal called "MyChart".  Sign up information is provided on this After Visit Summary.  MyChart is used to connect with patients for Virtual Visits (Telemedicine).  Patients are able to view lab/test results, encounter notes, upcoming appointments, etc.  Non-urgent messages can be sent to your provider as well.   To learn more about what you can do with MyChart, go to [email or web address].    Your next appointment:   Please keep lab appointment

## 2021-09-12 ENCOUNTER — Other Ambulatory Visit: Payer: Self-pay

## 2021-09-12 ENCOUNTER — Other Ambulatory Visit: Payer: PPO | Admitting: *Deleted

## 2021-09-12 DIAGNOSIS — I480 Paroxysmal atrial fibrillation: Secondary | ICD-10-CM | POA: Diagnosis not present

## 2021-09-12 DIAGNOSIS — E785 Hyperlipidemia, unspecified: Secondary | ICD-10-CM | POA: Diagnosis not present

## 2021-09-12 DIAGNOSIS — Z79899 Other long term (current) drug therapy: Secondary | ICD-10-CM | POA: Diagnosis not present

## 2021-09-12 DIAGNOSIS — I422 Other hypertrophic cardiomyopathy: Secondary | ICD-10-CM | POA: Diagnosis not present

## 2021-09-12 LAB — COMPREHENSIVE METABOLIC PANEL
ALT: 14 IU/L (ref 0–44)
AST: 22 IU/L (ref 0–40)
Albumin/Globulin Ratio: 2.6 — ABNORMAL HIGH (ref 1.2–2.2)
Albumin: 4.6 g/dL (ref 3.8–4.8)
Alkaline Phosphatase: 69 IU/L (ref 44–121)
BUN/Creatinine Ratio: 11 (ref 10–24)
BUN: 18 mg/dL (ref 8–27)
Bilirubin Total: 1.1 mg/dL (ref 0.0–1.2)
CO2: 27 mmol/L (ref 20–29)
Calcium: 10.1 mg/dL (ref 8.6–10.2)
Chloride: 105 mmol/L (ref 96–106)
Creatinine, Ser: 1.58 mg/dL — ABNORMAL HIGH (ref 0.76–1.27)
Globulin, Total: 1.8 g/dL (ref 1.5–4.5)
Glucose: 94 mg/dL (ref 70–99)
Potassium: 4.5 mmol/L (ref 3.5–5.2)
Sodium: 141 mmol/L (ref 134–144)
Total Protein: 6.4 g/dL (ref 6.0–8.5)
eGFR: 47 mL/min/{1.73_m2} — ABNORMAL LOW (ref >59)

## 2021-09-12 LAB — CBC
Hematocrit: 45.1 % (ref 37.5–51.0)
Hemoglobin: 15.9 g/dL (ref 13.0–17.7)
MCH: 32.5 pg (ref 26.6–33.0)
MCHC: 35.3 g/dL (ref 31.5–35.7)
MCV: 92 fL (ref 79–97)
Platelets: 135 10*3/uL — ABNORMAL LOW (ref 150–450)
RBC: 4.89 x10E6/uL (ref 4.14–5.80)
RDW: 11.9 % (ref 11.6–15.4)
WBC: 7.2 10*3/uL (ref 3.4–10.8)

## 2021-09-12 LAB — LIPID PANEL
Chol/HDL Ratio: 2.7 ratio (ref 0.0–5.0)
Cholesterol, Total: 151 mg/dL (ref 100–199)
HDL: 55 mg/dL (ref >39)
LDL Chol Calc (NIH): 77 mg/dL (ref 0–99)
Triglycerides: 105 mg/dL (ref 0–149)
VLDL Cholesterol Cal: 19 mg/dL (ref 5–40)

## 2021-09-13 ENCOUNTER — Other Ambulatory Visit: Payer: PPO

## 2021-09-19 ENCOUNTER — Telehealth: Payer: Self-pay | Admitting: *Deleted

## 2021-09-19 DIAGNOSIS — R1319 Other dysphagia: Secondary | ICD-10-CM | POA: Diagnosis not present

## 2021-09-19 DIAGNOSIS — Z7901 Long term (current) use of anticoagulants: Secondary | ICD-10-CM | POA: Diagnosis not present

## 2021-09-19 DIAGNOSIS — K219 Gastro-esophageal reflux disease without esophagitis: Secondary | ICD-10-CM | POA: Diagnosis not present

## 2021-09-19 DIAGNOSIS — Z1211 Encounter for screening for malignant neoplasm of colon: Secondary | ICD-10-CM | POA: Diagnosis not present

## 2021-09-19 DIAGNOSIS — R194 Change in bowel habit: Secondary | ICD-10-CM | POA: Diagnosis not present

## 2021-09-19 NOTE — Telephone Encounter (Signed)
   Pre-operative Risk Assessment    Patient Name: Randy Cantu  DOB: [birth year] MRN: [id number]      Request for Surgical Clearance   Procedure:   COLONOSCOPY/ENDOSCOPY  Date of Surgery: Clearance 01/18/22                                 Surgeon:  DR. [facility] Surgeon's Group or Practice Name:  [facility] Phone number:  [phone] Fax number:  [phone]   Type of Clearance Requested: - Medical  - Pharmacy:  Hold Apixaban (Eliquis)     Type of Anesthesia:   PROPOFOL   Additional requests/questions:   [name]   09/19/2021, 2:26 PM

## 2021-09-19 NOTE — Telephone Encounter (Signed)
Covering preop today, will route to pharm ([facility] - procedure 01/2022) - then pt will need call. Per notes, overall stable at 09/2021 OV but will need to be reminded that if anything changes between now and procedure date he will need to notify us. Has recall in for 03/2022.

## 2021-09-20 NOTE — Telephone Encounter (Signed)
Patient with diagnosis of afib on Eliquis for anticoagulation.    Procedure: colonoscopy/endoscopy Date of procedure: 01/18/22  CHA2DS2-VASc Score = 1  This indicates a 0.6% annual risk of stroke. The patient's score is based upon: CHF History: 0 HTN History: 0 Diabetes History: 0 Stroke History: 0 Vascular Disease History: 0 Age Score: 1 Gender Score: 0     CrCl 31mL/min Platelet count 135K  Per office protocol, patient can hold Eliquis for 1-2 days prior to procedure assuming no major changes in clinical status before procedure (still 4 months out).

## 2021-09-21 NOTE — Telephone Encounter (Signed)
   Primary Cardiologist: None  Chart reviewed as part of pre-operative protocol coverage. Given past medical history and time since last visit, based on ACC/AHA guidelines, Randy Cantu would be at acceptable risk for the planned procedure without further cardiovascular testing.   Patient with diagnosis of afib on Eliquis for anticoagulation.     Procedure: colonoscopy/endoscopy Date of procedure: 01/18/22   CHA2DS2-VASc Score = 1  This indicates a 0.6% annual risk of stroke. The patient's score is based upon: CHF History: 0 HTN History: 0 Diabetes History: 0 Stroke History: 0 Vascular Disease History: 0 Age Score: 1 Gender Score: 0     CrCl 29mL/min Platelet count 135K   Per office protocol, patient can hold Eliquis for 1-2 days prior to procedure assuming no major changes in clinical status before procedure (still 4 months out).  I will route this recommendation to the requesting party via Epic fax function and remove from pre-op pool.  Please call with questions.  Randy Cantu. Randy Mikles NP-C    09/21/2021, 8:36 AM [address] Office [phone] Fax [phone]

## 2021-11-17 ENCOUNTER — Ambulatory Visit: Payer: Medicare Other | Admitting: Neurology

## 2021-11-17 ENCOUNTER — Encounter: Payer: Self-pay | Admitting: Neurology

## 2021-11-17 VITALS — BP 123/76 | HR 55 | Ht 73.0 in | Wt 218.0 lb

## 2021-11-17 DIAGNOSIS — M5412 Radiculopathy, cervical region: Secondary | ICD-10-CM

## 2021-11-17 DIAGNOSIS — G4752 REM sleep behavior disorder: Secondary | ICD-10-CM

## 2021-11-17 DIAGNOSIS — R0683 Snoring: Secondary | ICD-10-CM

## 2021-11-17 DIAGNOSIS — G4719 Other hypersomnia: Secondary | ICD-10-CM

## 2021-11-17 DIAGNOSIS — I4811 Longstanding persistent atrial fibrillation: Secondary | ICD-10-CM | POA: Diagnosis not present

## 2021-11-17 MED ORDER — CLONAZEPAM 0.5 MG PO TABS: 0.7500 mg | ORAL_TABLET | Freq: Every day | ORAL | 2 refills | Status: DC
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
## 2021-11-17 NOTE — Patient Instructions (Signed)
Clonazepam Tablets What is this medication? CLONAZEPAM (kloe NA ze pam) treats seizures. It may also be used to treat panic disorder. It works by Child psychotherapist system calm down. It belongs to a group of medications called benzodiazepines. This medicine may be used for other purposes; ask your health care provider or pharmacist if you have questions. COMMON BRAND NAME(S): Ceberclon, Klonopin What should I tell my care team before I take this medication? They need to know if you have any of these conditions: An alcohol or drug abuse problem Bipolar disorder, depression, psychosis or other mental health condition Glaucoma Kidney or liver disease Lung or breathing disease Myasthenia gravis Parkinson disease Porphyria Seizures or a history of seizures Suicidal thoughts An unusual or allergic reaction to clonazepam, other benzodiazepines, foods, dyes, or preservatives Pregnant or trying to get pregnant Breast-feeding How should I use this medication? Take this medication by mouth with a glass of water. Follow the directions on the prescription label. If it upsets your stomach, take it with food or milk. Take your medication at regular intervals. Do not take it more often than directed. Do not stop taking or change the dose except on the advice of your care team. A special MedGuide will be given to you by the pharmacist with each prescription and refill. Be sure to read this information carefully each time. Talk to your care team regarding the use of this medication in children. Special care may be needed. Overdosage: If you think you have taken too much of this medicine contact a poison control center or emergency room at once. NOTE: This medicine is only for you. Do not share this medicine with others. What if I miss a dose? If you miss a dose, take it as soon as you can. If it is almost time for your next dose, take only that dose. Do not take double or extra doses. What may interact  with this medication? Do not take this medication with any of the following: Narcotic medications for cough Sodium oxybate This medication may also interact with the following: Alcohol Antihistamines for allergy, cough and cold Antiviral medications for HIV or AIDS Certain medications for anxiety or sleep Certain medications for depression, like amitriptyline, fluoxetine, sertraline Certain medications for fungal infections like ketoconazole and itraconazole Certain medications for seizures like carbamazepine, phenobarbital, phenytoin, primidone General anesthetics like halothane, isoflurane, methoxyflurane, propofol Local anesthetics like lidocaine, pramoxine, tetracaine Medications that relax muscles for surgery Narcotic medications for pain Phenothiazines like chlorpromazine, mesoridazine, prochlorperazine, thioridazine This list may not describe all possible interactions. Give your health care provider a list of all the medicines, herbs, non-prescription drugs, or dietary supplements you use. Also tell them if you smoke, drink alcohol, or use illegal drugs. Some items may interact with your medicine. What should I watch for while using this medication? Tell your care team if your symptoms do not start to get better or if they get worse. Do not stop taking except on your care team's advice. You may develop a severe reaction. Your care team will tell you how much medication to take. You may get drowsy or dizzy. Do not drive, use machinery, or do anything that needs mental alertness until you know how this medication affects you. To reduce the risk of dizzy and fainting spells, do not stand or sit up quickly, especially if you are an older patient. Alcohol may increase dizziness and drowsiness. Avoid alcoholic drinks. If you are taking another medication that also causes drowsiness, you may  have more side effects. Give your care team a list of all medications you use. Your care team will tell  you how much medication to take. Do not take more medication than directed. Call emergency services if you have problems breathing or unusual sleepiness. The use of this medication may increase the chance of suicidal thoughts or actions. Pay special attention to how you are responding while on this medication. Any worsening of mood, or thoughts of suicide or dying should be reported to your care team right away. What side effects may I notice from receiving this medication? Side effects that you should report to your care team as soon as possible: Allergic reactions--skin rash, itching, hives, swelling of the face, lips, tongue, or throat CNS depression--slow or shallow breathing, shortness of breath, feeling faint, dizziness, confusion, trouble staying awake Thoughts of suicide or self-harm, worsening mood, feelings of depression Side effects that usually do not require medical attention (report to your care team if they continue or are bothersome): Dizziness Drowsiness Headache This list may not describe all possible side effects. Call your doctor for medical advice about side effects. You may report side effects to FDA at 1-800-FDA-1088. Where should I keep my medication? Keep out of the reach of children and pets. This medication can be abused. Keep your medication in a safe place to protect it from theft. Do not share this medication with anyone. Selling or giving away this medication is dangerous and against the law. Store at room temperature between 15 and 30 degrees C (59 and 86 degrees F). Protect from light. Keep container tightly closed. This medication may cause accidental overdose and death if taken by other adults, children, or pets. Mix any unused medication with a substance like cat litter or coffee grounds. Then throw the medication away in a sealed container like a sealed bag or a coffee can with a lid. Do not use the medication after the expiration date. NOTE: This sheet is a  summary. It may not cover all possible information. If you have questions about this medicine, talk to your doctor, pharmacist, or health care provider.  2022 Elsevier/Gold Standard (2020-11-19 00:00:00)

## 2021-11-24 DIAGNOSIS — L910 Hypertrophic scar: Secondary | ICD-10-CM | POA: Diagnosis not present

## 2021-11-24 DIAGNOSIS — D485 Neoplasm of uncertain behavior of skin: Secondary | ICD-10-CM | POA: Diagnosis not present

## 2021-11-28 ENCOUNTER — Telehealth: Payer: Self-pay | Admitting: Neurology

## 2021-11-28 NOTE — Telephone Encounter (Signed)
Reviewed pt chart. Rx refill already sent to pharmacy on 11/17/21 #135, 2 refills. Refills should be on file at pharmacy.

## 2021-11-28 NOTE — Telephone Encounter (Signed)
Pt is requesting a refill for clonazePAM (KLONOPIN) 0.5 MG tablet .  Pharmacy: [facility] [phone]

## 2021-12-02 DIAGNOSIS — M9901 Segmental and somatic dysfunction of cervical region: Secondary | ICD-10-CM | POA: Diagnosis not present

## 2021-12-02 DIAGNOSIS — M5414 Radiculopathy, thoracic region: Secondary | ICD-10-CM | POA: Diagnosis not present

## 2021-12-02 DIAGNOSIS — M5032 Other cervical disc degeneration, mid-cervical region, unspecified level: Secondary | ICD-10-CM | POA: Diagnosis not present

## 2021-12-02 DIAGNOSIS — M9902 Segmental and somatic dysfunction of thoracic region: Secondary | ICD-10-CM | POA: Diagnosis not present

## 2022-01-16 ENCOUNTER — Other Ambulatory Visit: Payer: Self-pay

## 2022-01-16 DIAGNOSIS — I480 Paroxysmal atrial fibrillation: Secondary | ICD-10-CM

## 2022-01-16 DIAGNOSIS — E785 Hyperlipidemia, unspecified: Secondary | ICD-10-CM

## 2022-01-16 MED ORDER — FLECAINIDE ACETATE 100 MG PO TABS: ORAL_TABLET | ORAL | 2 refills | Status: DC

## 2022-01-16 MED ORDER — APIXABAN 5 MG PO TABS: 5.0000 mg | ORAL_TABLET | Freq: Two times a day (BID) | ORAL | 1 refills | Status: DC

## 2022-01-16 MED ORDER — ROSUVASTATIN CALCIUM 10 MG PO TABS: 10.0000 mg | ORAL_TABLET | Freq: Every day | ORAL | 2 refills | Status: DC

## 2022-01-16 MED ORDER — DILTIAZEM HCL ER COATED BEADS 120 MG PO CP24: 120.0000 mg | ORAL_CAPSULE | Freq: Every day | ORAL | 2 refills | Status: DC

## 2022-01-16 NOTE — Addendum Note (Signed)
Addended by: Carter Kitten D on: 01/16/2022 02:01 PM ? ? Modules accepted: Orders ? ?

## 2022-01-16 NOTE — Telephone Encounter (Signed)
Pt last saw Dr Caryl Comes 09/08/21, last labs 09/12/21 Creat 1.58, age 71, weight 98.9kg, based on specified criteria pt is on appropriate dosage of Eliquis '5mg'$  BID for afib.  Will refill rx. ?

## 2022-02-23 ENCOUNTER — Other Ambulatory Visit: Payer: Self-pay | Admitting: Neurology

## 2022-02-23 DIAGNOSIS — G4752 REM sleep behavior disorder: Secondary | ICD-10-CM

## 2022-02-28 ENCOUNTER — Other Ambulatory Visit: Payer: Self-pay | Admitting: Neurology

## 2022-02-28 DIAGNOSIS — G4752 REM sleep behavior disorder: Secondary | ICD-10-CM

## 2022-02-28 NOTE — Telephone Encounter (Signed)
Pt is needing to speak to the RN regarding his clonazePAM (KLONOPIN) 0.5 MG tablet. ?Pt is wanting to switch to [facility] for a 90 day qt ?Please advise. ?

## 2022-02-28 NOTE — Telephone Encounter (Signed)
Called the patient. He didn't need to discuss anything only that he needed the refill to go to mail service pharmacy. Advised I would forward this refill to Dr Brett Fairy. ?Pt is due.  ?

## 2022-03-01 MED ORDER — CLONAZEPAM 0.5 MG PO TABS: ORAL_TABLET | ORAL | 3 refills | Status: DC

## 2022-03-29 DIAGNOSIS — K635 Polyp of colon: Secondary | ICD-10-CM | POA: Diagnosis not present

## 2022-03-29 DIAGNOSIS — Z1211 Encounter for screening for malignant neoplasm of colon: Secondary | ICD-10-CM | POA: Diagnosis not present

## 2022-03-29 DIAGNOSIS — K644 Residual hemorrhoidal skin tags: Secondary | ICD-10-CM | POA: Diagnosis not present

## 2022-03-31 DIAGNOSIS — K635 Polyp of colon: Secondary | ICD-10-CM | POA: Diagnosis not present

## 2022-05-03 DIAGNOSIS — N393 Stress incontinence (female) (male): Secondary | ICD-10-CM | POA: Diagnosis not present

## 2022-05-03 DIAGNOSIS — N5201 Erectile dysfunction due to arterial insufficiency: Secondary | ICD-10-CM | POA: Diagnosis not present

## 2022-05-03 DIAGNOSIS — N400 Enlarged prostate without lower urinary tract symptoms: Secondary | ICD-10-CM | POA: Diagnosis not present

## 2022-06-20 ENCOUNTER — Other Ambulatory Visit: Payer: Self-pay | Admitting: Internal Medicine

## 2022-06-20 DIAGNOSIS — I480 Paroxysmal atrial fibrillation: Secondary | ICD-10-CM

## 2022-06-20 NOTE — Telephone Encounter (Signed)
Eliquis '5mg'$  refill request received. Patient is 72 years old, weight-98.9kg, Crea-1.58 on 09/12/2021, Diagnosis-Afib, and last seen by Dr. Caryl Comes on 09/08/2021. Dose is appropriate based on dosing criteria. Will send in refill to requested pharmacy.

## 2022-06-30 DIAGNOSIS — H02423 Myogenic ptosis of bilateral eyelids: Secondary | ICD-10-CM | POA: Diagnosis not present

## 2022-06-30 DIAGNOSIS — H02413 Mechanical ptosis of bilateral eyelids: Secondary | ICD-10-CM | POA: Diagnosis not present

## 2022-06-30 DIAGNOSIS — H02831 Dermatochalasis of right upper eyelid: Secondary | ICD-10-CM | POA: Diagnosis not present

## 2022-06-30 DIAGNOSIS — H02834 Dermatochalasis of left upper eyelid: Secondary | ICD-10-CM | POA: Diagnosis not present

## 2022-08-21 ENCOUNTER — Telehealth: Payer: Self-pay | Admitting: *Deleted

## 2022-08-21 DIAGNOSIS — H02413 Mechanical ptosis of bilateral eyelids: Secondary | ICD-10-CM | POA: Diagnosis not present

## 2022-08-21 DIAGNOSIS — H02423 Myogenic ptosis of bilateral eyelids: Secondary | ICD-10-CM | POA: Diagnosis not present

## 2022-08-21 DIAGNOSIS — H02831 Dermatochalasis of right upper eyelid: Secondary | ICD-10-CM | POA: Diagnosis not present

## 2022-08-21 DIAGNOSIS — H02834 Dermatochalasis of left upper eyelid: Secondary | ICD-10-CM | POA: Diagnosis not present

## 2022-08-21 NOTE — Telephone Encounter (Signed)
   Pre-operative Risk Assessment    Patient Name: Randy Cantu  DOB: [birth year] MRN: [id number]      Request for Surgical Clearance    Procedure:   B/L UPPER EYELID BLEPHAROPLASTY: CC: B/L UPPER EYELID BLEPHAROPLASTY REPAIR, INTERNAL APPROACH WITH B/L LOWER EYELID BLEPHAROPLASTY, MINI MID FACE LIFT AND TRICHOPHYTIC BROW PTSOSIS REPAIR   Date of Surgery:  Clearance TBD                                 Surgeon:  DR. Isidoro Donning Surgeon's Group or Practice Name:  [facility]  Phone number:  [phone] Fax number:  [phone]   Type of Clearance Requested:   - Medical  - Pharmacy:  Hold Apixaban (Eliquis)     Type of Anesthesia:   [place]; I DID TRY TO REACH THE OFFICE TO CONFIRM AS TO WHAT TYPE OF ANESTHESIA, [place] NO ANSWER   Additional requests/questions:    [name]   08/21/2022, 11:20 AM

## 2022-08-27 NOTE — Telephone Encounter (Signed)
Patient with diagnosis of atrial fibrillatio  on Eliquis for anticoagulation.    Procedure:   B/L UPPER EYELID BLEPHAROPLASTY: CC: B/L UPPER EYELID BLEPHAROPLASTY REPAIR, INTERNAL APPROACH WITH B/L LOWER EYELID BLEPHAROPLASTY, MINI MID FACE LIFT AND TRICHOPHYTIC BROW PTSOSIS REPAIR    Date of Surgery:  Clearance TBD           CHA2DS2-VASc Score = 1   This indicates a 0.6% annual risk of stroke. The patient's score is based upon: CHF History: 0 HTN History: 0 Diabetes History: 0 Stroke History: 0 Vascular Disease History: 0 Age Score: 1 Gender Score: 0      CrCl 61 Platelet count 135  Per office protocol, patient can hold Eliquis for 2 days prior to procedure.   Patient will not need bridging with Lovenox (enoxaparin) around procedure.  **This guidance is not considered finalized until pre-operative APP has relayed final recommendations.**

## 2022-08-28 ENCOUNTER — Telehealth: Payer: Self-pay | Admitting: *Deleted

## 2022-08-28 NOTE — Telephone Encounter (Signed)
   Name: Randy Cantu  DOB: 03/15/1951  MRN: [id number]  Primary Cardiologist: None   Preoperative team, please contact this patient and set up a phone call appointment for further preoperative risk assessment. Please obtain consent and complete medication review. Thank you for your help.  I confirm that guidance regarding antiplatelet and oral anticoagulation therapy has been completed and, if necessary, noted below.  Per office protocol, patient can hold Eliquis for 2 days prior to procedure.   Patient will not need bridging with Lovenox (enoxaparin) around procedure.   Deberah Pelton, NP 08/28/2022, 8:33 AM [place]

## 2022-08-28 NOTE — Telephone Encounter (Signed)
Pt agreeable to plan of care for tele pre op appt 08/31/22 @ 2 pm. Med rec and consent are done.      Patient Consent for Virtual Visit        Randy Cantu has provided verbal consent on 08/28/2022 for a virtual visit (video or telephone).   CONSENT FOR VIRTUAL VISIT FOR:  Randy Cantu  By participating in this virtual visit I agree to the following:  I hereby voluntarily request, consent and authorize [facility] and its employed or contracted physicians, physician assistants, nurse practitioners or other licensed health care professionals (the Practitioner), to provide me with telemedicine health care services (the "Services") as deemed necessary by the treating Practitioner. I acknowledge and consent to receive the Services by the Practitioner via telemedicine. I understand that the telemedicine visit will involve communicating with the Practitioner through live audiovisual communication technology and the disclosure of certain medical information by electronic transmission. I acknowledge that I have been given the opportunity to request an in-person assessment or other available alternative prior to the telemedicine visit and am voluntarily participating in the telemedicine visit.  I understand that I have the right to withhold or withdraw my consent to the use of telemedicine in the course of my care at any time, without affecting my right to future care or treatment, and that the Practitioner or I may terminate the telemedicine visit at any time. I understand that I have the right to inspect all information obtained and/or recorded in the course of the telemedicine visit and may receive copies of available information for a reasonable fee.  I understand that some of the potential risks of receiving the Services via telemedicine include:  Delay or interruption in medical evaluation due to technological equipment failure or disruption; Information transmitted may not be  sufficient (e.g. poor resolution of images) to allow for appropriate medical decision making by the Practitioner; and/or  In rare instances, security protocols could fail, causing a breach of personal health information.  Furthermore, I acknowledge that it is my responsibility to provide information about my medical history, conditions and care that is complete and accurate to the best of my ability. I acknowledge that Practitioner's advice, recommendations, and/or decision may be based on factors not within their control, such as incomplete or inaccurate data provided by me or distortions of diagnostic images or specimens that may result from electronic transmissions. I understand that the practice of medicine is not an exact science and that Practitioner makes no warranties or guarantees regarding treatment outcomes. I acknowledge that a copy of this consent can be made available to me via my patient portal ([facility]), or I can request a printed copy by calling the office of [facility].    I understand that my insurance will be billed for this visit.   I have read or had this consent read to me. I understand the contents of this consent, which adequately explains the benefits and risks of the Services being provided via telemedicine.  I have been provided ample opportunity to ask questions regarding this consent and the Services and have had my questions answered to my satisfaction. I give my informed consent for the services to be provided through the use of telemedicine in my medical care

## 2022-08-28 NOTE — Telephone Encounter (Signed)
Pt agreeable to plan of care for tele pre op appt 08/31/22 @ 2 pm. Med rec and consent are done.

## 2022-08-29 DIAGNOSIS — I4891 Unspecified atrial fibrillation: Secondary | ICD-10-CM | POA: Diagnosis not present

## 2022-08-29 DIAGNOSIS — K219 Gastro-esophageal reflux disease without esophagitis: Secondary | ICD-10-CM | POA: Diagnosis not present

## 2022-08-29 DIAGNOSIS — R066 Hiccough: Secondary | ICD-10-CM | POA: Diagnosis not present

## 2022-08-31 ENCOUNTER — Ambulatory Visit (INDEPENDENT_AMBULATORY_CARE_PROVIDER_SITE_OTHER): Payer: Medicare Other | Admitting: Family

## 2022-08-31 ENCOUNTER — Encounter (HOSPITAL_BASED_OUTPATIENT_CLINIC_OR_DEPARTMENT_OTHER): Payer: Self-pay | Admitting: Family

## 2022-08-31 DIAGNOSIS — Z01818 Encounter for other preprocedural examination: Secondary | ICD-10-CM

## 2022-08-31 NOTE — Progress Notes (Signed)
Virtual Visit via Telephone Note   Because of Randy Cantu's co-morbid illnesses, he is at least at moderate risk for complications without adequate follow up.  This format is felt to be most appropriate for this patient at this time.  The patient did not have access to video technology/had technical difficulties with video requiring transitioning to audio format only (telephone).  All issues noted in this document were discussed and addressed.  No physical exam could be performed with this format.  Please refer to the patient's chart for his consent to telehealth for [facility].   Date:  08/31/2022   ID:  Randy Cantu, DOB [birth year], MRN [id number] The patient was identified using 2 identifiers.  Patient Location: Home Provider Location: Home Office  PCP:  [name], [place] Providers Cardiologist:  Dr. Caryl Comes  Evaluation Performed:  Follow-Up Visit  Chief Complaint:  Preop clearance  History of Present Illness:    Randy Cantu is a 71 y.o. male with atrial fibrillation, hypertrophic cardiomyopathy, sinus bradycardia, hyperlipidemia. Last seen 09/08/21.   Presents today for preoperative clearance for upper eyelid blepharoplasty. Per pharmacy team may hold Eliquis 2 days prior to planned procedure. He inquires about holding for a week to minimize bruising, discussed recommendation for only 2 day hold prior.  Reports no shortness of breath and mild stable dyspnea on exertion. He reports no chest pain, pressure, or tightness. No edema, orthopnea, PND. Reports no palpitations.  He stays active golfing, working on projects around the new home which was recently built. He does walk the golf course and has an electric cart that carries his golf bag for him. Does note he is interested in being more active.    Past Medical History:  Diagnosis Date   Allergic rhinitis    HLD (hyperlipidemia)    Lactose intolerance    PAF (paroxysmal atrial  fibrillation) (HCC)    s/p afib ablation 11/17/10   Periodic limb movement disorder (PLMD)    Right foot pain    all of foot 1--/2016   Past Surgical History:  Procedure Laterality Date   atrial fibrillation ablation  11/17/10   PVI by Dr Rayann Heman   TONSILLECTOMY      Current Meds  Medication Sig   clonazePAM (KLONOPIN) 0.5 MG tablet TAKE 1 AND 1/2 TABLETS(0.75 MG) BY MOUTH AT BEDTIME   diltiazem (CARDIZEM CD) 120 MG 24 hr capsule Take 1 capsule (120 mg total) by mouth daily.   ELIQUIS 5 MG TABS tablet TAKE 1 TABLET BY MOUTH TWICE DAILY   fexofenadine (ALLEGRA) 180 MG tablet Take 180 mg by mouth daily as needed for allergies or rhinitis.   flecainide (TAMBOCOR) 100 MG tablet Take 1.5 tablets in am and 1 tablet (100) in pm my mouth daily. (Patient taking differently: Take 100 mg by mouth 2 (two) times daily. PER PT HE HAS DECREASED DOSE TO 100 MG BID)   levocetirizine (XYZAL) 5 MG tablet Take 5 mg by mouth as needed for allergies.   MAGNESIUM PO Take 750 mg by mouth as needed (constipation).   rosuvastatin (CRESTOR) 10 MG tablet Take 1 tablet (10 mg total) by mouth daily.    Allergies:   Pantoprazole sodium   Social History   Tobacco Use   Smoking status: Never   Smokeless tobacco: Never  Vaping Use   Vaping Use: Never used  Substance Use Topics   Alcohol use: Yes    Alcohol/week: 7.0 standard drinks of  alcohol    Types: 7 Glasses of wine per week   Drug use: No     Family Hx: The patient's family history includes Aplastic anemia in his father; Cancer in an other family member; Leukemia in his paternal grandfather; Peripheral vascular disease in his mother; Prostate cancer in his maternal grandfather. There is no history of Colon cancer.  ROS:   Please see the history of present illness.     All other systems reviewed and are negative.  Prior CV studies:   The following studies were reviewed today:  DATE TEST EF    24/21 Echo   65 % Asymmetric septal hypertrophy 17/11;   BAE mild PA mildly elevated   7/21 cMRI  67 % Asymmetric Septal hypertrophy 63m/7 mm LGE neg               Labs/Other Tests and Data Reviewed:    EKG:  No ECG reviewed.  Recent Labs: 09/12/2021: ALT 14; BUN 18; Creatinine, Ser 1.58; Hemoglobin 15.9; Platelets 135; Potassium 4.5; Sodium 141   Recent Lipid Panel Lab Results  Component Value Date/Time   CHOL 151 09/12/2021 10:19 AM   TRIG 105 09/12/2021 10:19 AM   HDL 55 09/12/2021 10:19 AM   CHOLHDL 2.7 09/12/2021 10:19 AM   CHOLHDL 4 08/23/2012 09:41 AM   LDLCALC 77 09/12/2021 10:19 AM   LDLDIRECT 70 04/18/2018 02:18 PM   LDLDIRECT 168.6 06/14/2007 10:49 AM   Wt Readings from Last 3 Encounters:  11/17/21 218 lb (98.9 kg)  09/08/21 215 lb (97.5 kg)  11/17/20 212 lb (96.2 kg)    Risk Assessment/Calculations:    CHA2DS2-VASc Score = 1   This indicates a 0.6% annual risk of stroke. The patient's score is based upon: CHF History: 0 HTN History: 0 Diabetes History: 0 Stroke History: 0 Vascular Disease History: 0 Age Score: 1 Gender Score: 0         Objective:    Vital Signs:  No vitals available for review.   ASSESSMENT & PLAN:    Preoperative clearance - Able to achieve >4 METS. Per AHA/ACC guidelines, he is deemed acceptable risk for the planned procedure without additional cardiovascular testing. Will route to surgical team so they are aware.   Anticoagulant:May hold Eliquis 2 days prior to planned procedure per office protocols and pharmacy review.  Cardiovascular risk reduction - Interested in being more active. [facility]    Time:   Today, I have spent 11 minutes with the patient with telehealth technology discussing the above problems.     Medication Adjustments/Labs and Tests Ordered: Current medicines are reviewed at length with the patient today.  Concerns regarding medicines are outlined above.   Tests Ordered: No orders of the defined types were placed in this  encounter.   Medication Changes: No orders of the defined types were placed in this encounter.   Follow Up:  In Person  as scheduled with Dr. KCaryl Comes Signed, CLoel Dubonnet NP  08/31/2022 2:37 PM    [place]

## 2022-08-31 NOTE — Patient Instructions (Addendum)
Medication Instructions:  You may hold Eliquis 2 days prior to planned procedure.   *If you need a refill on your cardiac medications before your next appointment, please call your pharmacy*   Lab Work/Testing/Procedures: None ordered today.    Follow-Up: At [facility], you and your health needs are our priority.  As part of our continuing mission to provide you with exceptional heart care, we have created designated Provider Care Teams.  These Care Teams include your primary Cardiologist (physician) and Advanced Practice Providers (APPs -  Physician Assistants and Nurse Practitioners) who all work together to provide you with the care you need, when you need it.  We recommend signing up for the patient portal called "MyChart".  Sign up information is provided on this After Visit Summary.  MyChart is used to connect with patients for Virtual Visits (Telemedicine).  Patients are able to view lab/test results, encounter notes, upcoming appointments, etc.  Non-urgent messages can be sent to your provider as well.   To learn more about what you can do with MyChart, go to [email or web address].    Your next appointment:   As scheduled with Dr. Caryl Comes  Other Instructions  We have referred you to [facility] today for free 2 week trial. You can call or stop by the facility to follow up with them regarding starting 2 week trial.  [facility] offers Right Start which is a 9 week program consisting of twice per week exercise classes for $99.  There is also the PREP (Physician Referred Exercise Program) at the [facility] which is 2 exercise classes per week for 3 months. This would be led by a nurse and free to you. If you would like a referral to PREP, please let us know.

## 2022-10-04 ENCOUNTER — Ambulatory Visit: Payer: Medicare Other | Attending: Internal Medicine | Admitting: Internal Medicine

## 2022-10-04 VITALS — BP 124/72 | HR 49 | Ht 73.0 in | Wt 225.6 lb

## 2022-10-04 DIAGNOSIS — E785 Hyperlipidemia, unspecified: Secondary | ICD-10-CM

## 2022-10-04 DIAGNOSIS — R072 Precordial pain: Secondary | ICD-10-CM | POA: Diagnosis not present

## 2022-10-04 DIAGNOSIS — R001 Bradycardia, unspecified: Secondary | ICD-10-CM | POA: Diagnosis not present

## 2022-10-04 DIAGNOSIS — Z01812 Encounter for preprocedural laboratory examination: Secondary | ICD-10-CM

## 2022-10-04 DIAGNOSIS — I480 Paroxysmal atrial fibrillation: Secondary | ICD-10-CM | POA: Diagnosis not present

## 2022-10-04 MED ORDER — ROSUVASTATIN CALCIUM 10 MG PO TABS: 10.0000 mg | ORAL_TABLET | Freq: Every day | ORAL | 3 refills | Status: DC

## 2022-10-04 MED ORDER — FLECAINIDE ACETATE 100 MG PO TABS: 100.0000 mg | ORAL_TABLET | Freq: Two times a day (BID) | ORAL | 3 refills | Status: DC

## 2022-10-04 MED ORDER — APIXABAN 5 MG PO TABS: 5.0000 mg | ORAL_TABLET | Freq: Two times a day (BID) | ORAL | 3 refills | Status: DC

## 2022-10-04 MED ORDER — DILTIAZEM HCL ER COATED BEADS 120 MG PO CP24: 120.0000 mg | ORAL_CAPSULE | Freq: Every day | ORAL | 3 refills | Status: DC

## 2022-10-04 NOTE — Progress Notes (Signed)
Patient Care Team: [name], MD as PCP - General (Family Medicine) [name], MD as Consulting Physician (Cardiology)   HPI  Randy Cantu is a 71 y.o. male Seen in follow-up for atrial fibrillation managed with a combination of verapamil and flecainide.  He is status post PVI 1/12-JA  DATE PR interval QRSduration Flecainide HR   2009 19 90 0   12/18 208 108 150   6/19 222 114 150 56  6/20 224 106 150 44  11/22 218 110 150 50  11/23 232 102 150/100 49   No shortness of breath or nocturnal dyspnea.  Rare palpitations.  Awakened about a month ago with an hour or 2 of tachypalpitations.  First time in decades.  Also some exertional chest discomfort.  On Apixoban; no bleeding     DATE TEST EF   24/21 Echo   65 % Asymmetric septal hypertrophy 17/11;  BAE mild PA mildly elevated   7/21 cMRI  67 % Asymmetric Septal hypertrophy 38m/7 mm LGE neg            Thromboembolic risk factors ( age -[age] for a CHADSVASc Score of 1    Records and Results Reviewed  Past Medical History:  Diagnosis Date   Allergic rhinitis    HLD (hyperlipidemia)    Lactose intolerance    PAF (paroxysmal atrial fibrillation) (HCC)    s/p afib ablation 11/17/10   Periodic limb movement disorder (PLMD)    Right foot pain    all of foot 1--/2016    Past Surgical History:  Procedure Laterality Date   atrial fibrillation ablation  11/17/10   PVI by Dr ARayann Heman  TONSILLECTOMY      Current Outpatient Medications  Medication Sig Dispense Refill   clonazePAM (KLONOPIN) 0.5 MG tablet TAKE 1 AND 1/2 TABLETS(0.75 MG) BY MOUTH AT BEDTIME 135 tablet 3   diltiazem (CARDIZEM CD) 120 MG 24 hr capsule Take 1 capsule (120 mg total) by mouth daily. 90 capsule 2   ELIQUIS 5 MG TABS tablet TAKE 1 TABLET BY MOUTH TWICE DAILY 180 tablet 1   fexofenadine (ALLEGRA) 180 MG tablet Take 180 mg by mouth daily as needed for allergies or rhinitis.     flecainide (TAMBOCOR) 100 MG tablet Take 1.5 tablets  in am and 1 tablet (100) in pm my mouth daily. (Patient taking differently: Take 100 mg by mouth 2 (two) times daily. PER PT HE HAS DECREASED DOSE TO 100 MG BID) 225 tablet 2   levocetirizine (XYZAL) 5 MG tablet Take 5 mg by mouth as needed for allergies.     MAGNESIUM PO Take 750 mg by mouth as needed (constipation).     rosuvastatin (CRESTOR) 10 MG tablet Take 1 tablet (10 mg total) by mouth daily. 90 tablet 2   No current facility-administered medications for this visit.    Allergies  Allergen Reactions   Pantoprazole Sodium     REACTION: rash from this or from  topical disinfectant      Review of Systems negative except from HPI and PMH  Physical Exam BP 124/72   Pulse (!) 49   Ht '6\' 1"'$  (1.854 m)   Wt 225 lb 9.6 oz (102.3 kg)   SpO2 98%   BMI 29.76 kg/m  Well developed and nourished in no acute distress HENT normal Neck supple with JVP-  flat  Clear Regular rate and rhythm, no murmurs or gallops Abd-soft with active BS No Clubbing cyanosis  edema Skin-warm and dry A & Oriented  Grossly normal sensory and motor function  ECG      Assessment and  Plan  Atrial fib paroxysmal  Sinus bradycardia  Hyperlipidemia  Septal hypertrophy  Coronary artery calcification  Exertional chest discomfort     1 episode of interval atrial fibrillation.  We will continue him on flecainide 100 twice daily.  No bleeding.  Continue the Eliquis at 5 mg twice daily we will check his CBC and renal function.  Exertional chest discomfort, we will undertake CTA as a coronary artery calcifications were noted on a CT scan 3/21 \ Continue statin; last LDL was reasonable at 77 albeit if he has significant disease,

## 2022-10-04 NOTE — Patient Instructions (Signed)
Medication Instructions:  Your physician recommends that you continue on your current medications as directed. Please refer to the Current Medication list given to you today.  *If you need a refill on your cardiac medications before your next appointment, please call your pharmacy*   Lab Work: CBC and BMET today If you have labs (blood work) drawn today and your tests are completely normal, you will receive your results only by: [facility] (if you have MyChart) OR A paper copy in the mail If you have any lab test that is abnormal or we need to change your treatment, we will call you to review the results.   Testing/Procedures:   Your cardiac CT will be scheduled at one of the below locations:   [facility] [address] [place], [place] [address] [phone]   If scheduled at [facility], please arrive at the [facility] and Children's Entrance (Entrance C2) of [facility] 30 minutes prior to test start time. You can use the FREE valet parking offered at entrance C (encouraged to control the heart rate for the test)  Proceed to the [facility] Radiology Department (first floor) to check-in and test prep.  All radiology patients and guests should use entrance C2 at [facility], accessed from [facility], even though the hospital's physical address listed is [address]     Please follow these instructions carefully (unless otherwise directed):  Hold all erectile dysfunction medications at least 3 days (72 hrs) prior to test. (Ie viagra, cialis, sildenafil, tadalafil, etc) We will administer nitroglycerin during this exam.   On the Night Before the Test: Be sure to Drink plenty of water. Do not consume any caffeinated/decaffeinated beverages or chocolate 12 hours prior to your test. Do not take any antihistamines 12 hours prior to your test.  On the Day of the Test: Drink plenty of water until 1 hour prior to  the test. Do not eat any food 1 hour prior to test. You may take your regular medications prior to the test.  Take metoprolol (Lopressor) two hours prior to test. HOLD Furosemide/Hydrochlorothiazide morning of the test. FEMALES- please wear underwire-free bra if available, avoid dresses & tight clothing       After the Test: Drink plenty of water. After receiving IV contrast, you may experience a mild flushed feeling. This is normal. On occasion, you may experience a mild rash up to 24 hours after the test. This is not dangerous. If this occurs, you can take Benadryl 25 mg and increase your fluid intake. If you experience trouble breathing, this can be serious. If it is severe call 911 IMMEDIATELY. If it is mild, please call our office. If you take any of these medications: Glipizide/Metformin, Avandament, Glucavance, please do not take 48 hours after completing test unless otherwise instructed.  We will call to schedule your test 2-4 weeks out understanding that some insurance companies will need an authorization prior to the service being performed.   For non-scheduling related questions, please contact the cardiac imaging nurse navigator should you have any questions/concerns: Marchia Bond, Cardiac Imaging Nurse Navigator Gordy Clement, Cardiac Imaging Nurse Navigator [facility] Direct Office Dial: [phone]   For scheduling needs, including cancellations and rescheduling, please call [place], [phone].     Follow-Up: At [facility], you and your health needs are our priority.  As part of our continuing mission to provide you with exceptional heart care, we have  created designated Provider Care Teams.  These Care Teams include your primary Cardiologist (physician) and Advanced Practice Providers (APPs -  Physician Assistants and Nurse Practitioners) who all work together to provide you with the care you need, when you need it.  We  recommend signing up for the patient portal called "MyChart".  Sign up information is provided on this After Visit Summary.  MyChart is used to connect with patients for Virtual Visits (Telemedicine).  Patients are able to view lab/test results, encounter notes, upcoming appointments, etc.  Non-urgent messages can be sent to your provider as well.   To learn more about what you can do with MyChart, go to [email or web address].    Your next appointment:   12 months with Dr Caryl Comes  Important Information About Sugar

## 2022-10-05 LAB — CBC
Hematocrit: 45.9 % (ref 37.5–51.0)
Hemoglobin: 15.7 g/dL (ref 13.0–17.7)
MCH: 32.5 pg (ref 26.6–33.0)
MCHC: 34.2 g/dL (ref 31.5–35.7)
MCV: 95 fL (ref 79–97)
Platelets: 152 10*3/uL (ref 150–450)
RBC: 4.83 x10E6/uL (ref 4.14–5.80)
RDW: 12.1 % (ref 11.6–15.4)
WBC: 6.4 10*3/uL (ref 3.4–10.8)

## 2022-10-05 LAB — BASIC METABOLIC PANEL
BUN/Creatinine Ratio: 16 (ref 10–24)
BUN: 19 mg/dL (ref 8–27)
CO2: 25 mmol/L (ref 20–29)
Calcium: 10 mg/dL (ref 8.6–10.2)
Chloride: 103 mmol/L (ref 96–106)
Creatinine, Ser: 1.21 mg/dL (ref 0.76–1.27)
Glucose: 100 mg/dL — ABNORMAL HIGH (ref 70–99)
Potassium: 4.8 mmol/L (ref 3.5–5.2)
Sodium: 139 mmol/L (ref 134–144)
eGFR: 64 mL/min/{1.73_m2} (ref >59)

## 2022-10-23 ENCOUNTER — Telehealth (HOSPITAL_COMMUNITY): Payer: Self-pay | Admitting: *Deleted

## 2022-10-23 ENCOUNTER — Telehealth (HOSPITAL_COMMUNITY): Payer: Self-pay | Admitting: Emergency Medicine

## 2022-10-23 NOTE — Telephone Encounter (Signed)
Attempted to call patient regarding upcoming cardiac CT appointment. °Left message on voicemail with name and callback number °Carlyn Lemke RN Navigator Cardiac Imaging °[facility] °[phone] Office °[phone] Cell ° °

## 2022-10-23 NOTE — Telephone Encounter (Signed)
Patient returning call regarding upcoming cardiac imaging study; pt verbalizes understanding of appt date/time, parking situation and where to check in, pre-test NPO status and verified current allergies; name and call back number provided for further questions should they arise  Gordy Clement RN Navigator Cardiac Imaging [facility] [phone] office [phone] cell  Patient to take his daily medications two hours prior to his cardiac CT scan.  He is aware to arrive at 9am.

## 2022-10-25 ENCOUNTER — Other Ambulatory Visit: Payer: Self-pay | Admitting: Cardiology

## 2022-10-25 ENCOUNTER — Ambulatory Visit (HOSPITAL_COMMUNITY)
Admission: RE | Admit: 2022-10-25 | Discharge: 2022-10-25 | Disposition: A | Discharge status: Home / Self Care | Payer: Medicare Other | Source: Ambulatory Visit | Attending: Internal Medicine | Admitting: Internal Medicine

## 2022-10-25 DIAGNOSIS — R072 Precordial pain: Secondary | ICD-10-CM | POA: Insufficient documentation

## 2022-10-25 DIAGNOSIS — I2510 Atherosclerotic heart disease of native coronary artery without angina pectoris: Secondary | ICD-10-CM | POA: Diagnosis not present

## 2022-10-25 DIAGNOSIS — R931 Abnormal findings on diagnostic imaging of heart and coronary circulation: Secondary | ICD-10-CM

## 2022-10-25 MED ORDER — NITROGLYCERIN 0.4 MG SL SUBL
SUBLINGUAL_TABLET | SUBLINGUAL | Status: AC
  Filled 2022-10-25: qty 2

## 2022-10-25 MED ORDER — NITROGLYCERIN 0.4 MG SL SUBL
0.8000 mg | SUBLINGUAL_TABLET | Freq: Once | SUBLINGUAL | Status: AC
  Administered 2022-10-25: 0.8 mg via SUBLINGUAL

## 2022-10-25 MED ORDER — IOHEXOL 350 MG/ML SOLN
100.0000 mL | Freq: Once | INTRAVENOUS | Status: AC | PRN
  Administered 2022-10-25: 100 mL via INTRAVENOUS

## 2022-10-26 ENCOUNTER — Ambulatory Visit (HOSPITAL_BASED_OUTPATIENT_CLINIC_OR_DEPARTMENT_OTHER)
Admission: RE | Admit: 2022-10-26 | Discharge: 2022-10-26 | Disposition: A | Discharge status: Home / Self Care | Payer: Medicare Other | Source: Ambulatory Visit | Attending: Cardiology | Admitting: Cardiology

## 2022-10-26 DIAGNOSIS — R931 Abnormal findings on diagnostic imaging of heart and coronary circulation: Secondary | ICD-10-CM

## 2022-10-26 DIAGNOSIS — R072 Precordial pain: Secondary | ICD-10-CM | POA: Diagnosis not present

## 2022-10-31 ENCOUNTER — Encounter: Payer: Self-pay | Admitting: Internal Medicine

## 2022-10-31 DIAGNOSIS — E785 Hyperlipidemia, unspecified: Secondary | ICD-10-CM

## 2022-11-17 DIAGNOSIS — M25562 Pain in left knee: Secondary | ICD-10-CM | POA: Diagnosis not present

## 2022-11-17 DIAGNOSIS — M25561 Pain in right knee: Secondary | ICD-10-CM | POA: Diagnosis not present

## 2022-11-20 ENCOUNTER — Ambulatory Visit: Payer: Medicare Other | Admitting: Neurology

## 2022-11-21 DIAGNOSIS — D225 Melanocytic nevi of trunk: Secondary | ICD-10-CM | POA: Diagnosis not present

## 2022-11-21 DIAGNOSIS — D485 Neoplasm of uncertain behavior of skin: Secondary | ICD-10-CM | POA: Diagnosis not present

## 2022-11-21 DIAGNOSIS — L821 Other seborrheic keratosis: Secondary | ICD-10-CM | POA: Diagnosis not present

## 2022-11-21 DIAGNOSIS — M7130 Other bursal cyst, unspecified site: Secondary | ICD-10-CM | POA: Diagnosis not present

## 2022-11-21 NOTE — Progress Notes (Deleted)
Benito Mccreedy D.[place] Phone: [phone]   Assessment and Plan:     There are no diagnoses linked to this encounter.  ***   Pertinent previous records reviewed include ***   Follow Up: ***     Subjective:   I, Moenique Parris, am serving as a Education administrator for Doctor Glennon Mac  Chief Complaint: left knee pain   HPI:   11/22/2022 Patient is a 72 year old male complaining of left knee pain. Patient states  Relevant Historical Information: ***  Additional pertinent review of systems negative.   Current Outpatient Medications:    apixaban (ELIQUIS) 5 MG TABS tablet, Take 1 tablet (5 mg total) by mouth 2 (two) times daily., Disp: 180 tablet, Rfl: 3   clonazePAM (KLONOPIN) 0.5 MG tablet, TAKE 1 AND 1/2 TABLETS(0.75 MG) BY MOUTH AT BEDTIME, Disp: 135 tablet, Rfl: 3   diltiazem (CARDIZEM CD) 120 MG 24 hr capsule, Take 1 capsule (120 mg total) by mouth daily., Disp: 90 capsule, Rfl: 3   fexofenadine (ALLEGRA) 180 MG tablet, Take 180 mg by mouth daily as needed for allergies or rhinitis., Disp: , Rfl:    flecainide (TAMBOCOR) 100 MG tablet, Take 1 tablet (100 mg total) by mouth 2 (two) times daily., Disp: 180 tablet, Rfl: 3   levocetirizine (XYZAL) 5 MG tablet, Take 5 mg by mouth as needed for allergies., Disp: , Rfl:    MAGNESIUM PO, Take 750 mg by mouth as needed (constipation)., Disp: , Rfl:    rosuvastatin (CRESTOR) 10 MG tablet, Take 1 tablet (10 mg total) by mouth daily., Disp: 90 tablet, Rfl: 3   Objective:     There were no vitals filed for this visit.    There is no height or weight on file to calculate BMI.    Physical Exam:    ***   Electronically signed by:  Benito Mccreedy D.[facility] 7:49 AM 11/23/22

## 2022-11-22 ENCOUNTER — Ambulatory Visit: Payer: Self-pay

## 2022-11-22 ENCOUNTER — Encounter: Payer: Medicare Other | Admitting: Sports Medicine

## 2022-11-23 NOTE — Progress Notes (Signed)
This encounter was created in error - please disregard.

## 2022-11-24 DIAGNOSIS — M25562 Pain in left knee: Secondary | ICD-10-CM | POA: Diagnosis not present

## 2022-11-27 DIAGNOSIS — M25562 Pain in left knee: Secondary | ICD-10-CM | POA: Diagnosis not present

## 2022-12-04 ENCOUNTER — Ambulatory Visit: Payer: Medicare Other | Admitting: Neurology

## 2022-12-04 ENCOUNTER — Encounter: Payer: Self-pay | Admitting: Neurology

## 2022-12-04 VITALS — BP 122/72 | HR 45 | Ht 73.0 in | Wt 220.0 lb

## 2022-12-04 DIAGNOSIS — R0683 Snoring: Secondary | ICD-10-CM | POA: Diagnosis not present

## 2022-12-04 DIAGNOSIS — G4752 REM sleep behavior disorder: Secondary | ICD-10-CM | POA: Diagnosis not present

## 2022-12-04 DIAGNOSIS — I4811 Longstanding persistent atrial fibrillation: Secondary | ICD-10-CM | POA: Diagnosis not present

## 2022-12-04 MED ORDER — CLONAZEPAM 0.5 MG PO TABS: ORAL_TABLET | ORAL | 3 refills | Status: DC
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
## 2022-12-04 NOTE — Patient Instructions (Signed)
Atrial Fibrillation  Atrial fibrillation is a type of irregular or rapid heartbeat (arrhythmia). In atrial fibrillation, the top part of the heart (atria) beats in an irregular pattern. This makes the heart unable to pump blood normally and effectively. The goal of treatment is to prevent blood clots from forming, control your heart rate, or restore your heartbeat to a normal rhythm. If this condition is not treated, it can cause serious problems, such as a weakened heart muscle (cardiomyopathy) or a stroke. What are the causes? This condition is often caused by medical conditions that damage the heart's electrical system. These include: High blood pressure (hypertension). This is the most common cause. Certain heart problems or conditions, such as heart failure, coronary artery disease, heart valve problems, or heart surgery. Diabetes. Overactive thyroid (hyperthyroidism). Obesity. Chronic kidney disease. In some cases, the cause of this condition is not known. What increases the risk? This condition is more likely to develop in: Older people. People who smoke. Athletes who do endurance exercise. People who have a family history of atrial fibrillation. Men. People who use drugs. People who drink a lot of alcohol. People who have lung conditions, such as emphysema, pneumonia, or COPD. People who have obstructive sleep apnea. What are the signs or symptoms? Symptoms of this condition include: A feeling that your heart is racing or beating irregularly. Discomfort or pain in your chest. Shortness of breath. Sudden light-headedness or weakness. Tiring easily during exercise or activity. Fatigue. Syncope (fainting). Sweating. In some cases, there are no symptoms. How is this diagnosed? Your health care provider may detect atrial fibrillation when taking your pulse. If detected, this condition may be diagnosed with: An electrocardiogram (ECG) to check electrical signals of the  heart. An ambulatory cardiac monitor to record your heart's activity for a few days. A transthoracic echocardiogram (TTE) to create pictures of your heart. A transesophageal echocardiogram (TEE) to create even closer pictures of your heart. A stress test to check your blood supply while you exercise. Imaging tests, such as a CT scan or chest X-ray. Blood tests. How is this treated? Treatment depends on underlying conditions and how you feel when you experience atrial fibrillation. This condition may be treated with: Medicines to prevent blood clots or to treat heart rate or heart rhythm problems. Electrical cardioversion to reset the heart's rhythm. A pacemaker to correct abnormal heart rhythm. Ablation to remove the heart tissue that sends abnormal signals. Left atrial appendage closure to seal the area where blood clots can form. In some cases, underlying conditions will be treated. Follow these instructions at home: Medicines Take over-the counter and prescription medicines only as told by your health care provider. Do not take any new medicines without talking to your health care provider. If you are taking blood thinners: Talk with your health care provider before you take any medicines that contain aspirin or NSAIDs, such as ibuprofen. These medicines increase your risk for dangerous bleeding. Take your medicine exactly as told, at the same time every day. Avoid activities that could cause injury or bruising, and follow instructions about how to prevent falls. Wear a medical alert bracelet or carry a card that lists what medicines you take. Lifestyle     Do not use any products that contain nicotine or tobacco, such as cigarettes, e-cigarettes, and chewing tobacco. If you need help quitting, ask your health care provider. Eat heart-healthy foods. Talk with a dietitian to make an eating plan that is right for you. Exercise regularly as told  by your health care provider. Do not  drink alcohol. Lose weight if you are overweight. Do not use drugs, including cannabis. General instructions If you have obstructive sleep apnea, manage your condition as told by your health care provider. Do not use diet pills unless your health care provider approves. Diet pills can make heart problems worse. Keep all follow-up visits as told by your health care provider. This is important. Contact a health care provider if you: Notice a change in the rate, rhythm, or strength of your heartbeat. Are taking a blood thinner and you notice more bruising. Tire more easily when you exercise or do heavy work. Have a sudden change in weight. Get help right away if you have:  Chest pain, abdominal pain, sweating, or weakness. Trouble breathing. Side effects of blood thinners, such as blood in your vomit, stool, or urine, or bleeding that cannot stop. Any symptoms of a stroke. "BE FAST" is an easy way to remember the main warning signs of a stroke: B - Balance. Signs are dizziness, sudden trouble walking, or loss of balance. E - Eyes. Signs are trouble seeing or a sudden change in vision. F - Face. Signs are sudden weakness or numbness of the face, or the face or eyelid drooping on one side. A - Arms. Signs are weakness or numbness in an arm. This happens suddenly and usually on one side of the body. S - Speech. Signs are sudden trouble speaking, slurred speech, or trouble understanding what people say. T - Time. Time to call emergency services. Write down what time symptoms started. Other signs of a stroke, such as: A sudden, severe headache with no known cause. Nausea or vomiting. Seizure. These symptoms may represent a serious problem that is an emergency. Do not wait to see if the symptoms will go away. Get medical help right away. Call your local emergency services (911 in the [place] Do not drive yourself to the hospital. Summary Atrial fibrillation is a type of irregular or rapid  heartbeat (arrhythmia). Symptoms include a feeling that your heart is beating fast or irregularly. You may be given medicines to prevent blood clots or to treat heart rate or heart rhythm problems. Get help right away if you have signs or symptoms of a stroke. Get help right away if you cannot catch your breath or have chest pain or pressure. This information is not intended to replace advice given to you by your health care provider. Make sure you discuss any questions you have with your health care provider. Document Revised: 04/16/2019 Document Reviewed: 04/16/2019 Elsevier Patient Education  [place].

## 2022-12-26 ENCOUNTER — Telehealth: Payer: Self-pay | Admitting: Neurology

## 2022-12-26 NOTE — Telephone Encounter (Signed)
12/19/22 [employer] medicare no auth req EE  12/25/22 left VM [place]

## 2022-12-28 DIAGNOSIS — M9901 Segmental and somatic dysfunction of cervical region: Secondary | ICD-10-CM | POA: Diagnosis not present

## 2022-12-28 DIAGNOSIS — M9902 Segmental and somatic dysfunction of thoracic region: Secondary | ICD-10-CM | POA: Diagnosis not present

## 2022-12-28 DIAGNOSIS — S134XXA Sprain of ligaments of cervical spine, initial encounter: Secondary | ICD-10-CM | POA: Diagnosis not present

## 2022-12-28 DIAGNOSIS — S29012A Strain of muscle and tendon of back wall of thorax, initial encounter: Secondary | ICD-10-CM | POA: Diagnosis not present

## 2023-01-17 DIAGNOSIS — N1831 Chronic kidney disease, stage 3a: Secondary | ICD-10-CM | POA: Diagnosis not present

## 2023-01-17 DIAGNOSIS — I4891 Unspecified atrial fibrillation: Secondary | ICD-10-CM | POA: Diagnosis not present

## 2023-01-17 DIAGNOSIS — U071 COVID-19: Secondary | ICD-10-CM | POA: Diagnosis not present

## 2023-01-24 ENCOUNTER — Other Ambulatory Visit: Payer: Self-pay

## 2023-01-24 DIAGNOSIS — I480 Paroxysmal atrial fibrillation: Secondary | ICD-10-CM

## 2023-01-24 MED ORDER — APIXABAN 5 MG PO TABS: 5.0000 mg | ORAL_TABLET | Freq: Two times a day (BID) | ORAL | 1 refills | Status: DC

## 2023-01-24 NOTE — Telephone Encounter (Signed)
Received faxed Eliquis refill request from [facility].  Pt last saw Dr Caryl Comes 10/04/22, last labs 10/04/22 Creat 1.21, age 72, weight 99.8kg, based on specified criteria pt is on appropriate dosage of Eliquis 5mg  BID for afib.  Will refill rx.

## 2023-03-11 NOTE — Progress Notes (Unsigned)
[facility] [address] [place] [address] Phone: [phone] Subjective:   [name], am serving as a scribe for Dr. Antoine Primas.  I'm seeing this patient by the request  of:  Farris Has, MD  CC: right knee pain   [id number]  Last seen March 2021 for shoulder pain  Randy Cantu is a 72 y.o. male coming in with complaint of R knee pain. Fell off ladder and fx fibula on L leg in January. Mid March R knee began to hurt. Hurts over patellar tendon and when going into extension. Standing and walking can feel it all the time. Does where brace, feels like it gives him stability. Has use topical, but doesn't help.  Right knee xray 2/20- moderate PF arthritis      Past Medical History:  Diagnosis Date   Allergic rhinitis    HLD (hyperlipidemia)    Lactose intolerance    PAF (paroxysmal atrial fibrillation) (HCC)    s/p afib ablation 11/17/10   Periodic limb movement disorder (PLMD)    Right foot pain    all of foot 1--/2016   Past Surgical History:  Procedure Laterality Date   atrial fibrillation ablation  11/17/10   PVI by Dr Johney Frame   TONSILLECTOMY     Social History   Socioeconomic History   Marital status: Married    Spouse name: Not on file   Number of children: 2   Years of education: Not on file   Highest education level: Not on file  Occupational History   Occupation: DOCTOR/EYE    Employer: SELF EMPLOYED  Tobacco Use   Smoking status: Never   Smokeless tobacco: Never  Vaping Use   Vaping Use: Never used  Substance and Sexual Activity   Alcohol use: Yes    Alcohol/week: 7.0 standard drinks of alcohol    Types: 7 Glasses of wine per week   Drug use: No   Sexual activity: Not on file  Other Topics Concern   Not on file  Social History Narrative   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   Social Determinants of Health   Financial Resource Strain: Not on file   Food Insecurity: Not on file  Transportation Needs: Not on file  Physical Activity: Not on file  Stress: Not on file  Social Connections: Not on file   Allergies  Allergen Reactions   Pantoprazole Sodium     REACTION: rash from this or from  topical disinfectant   Family History  Problem Relation Age of Onset   Peripheral vascular disease Mother        afib in her 39s   Aplastic anemia Father    Cancer Other    Prostate cancer Maternal Grandfather    Leukemia Paternal Grandfather    Colon cancer Neg Hx      Current Outpatient Medications (Cardiovascular):    diltiazem (CARDIZEM CD) 120 MG 24 hr capsule, Take 1 capsule (120 mg total) by mouth daily.   flecainide (TAMBOCOR) 100 MG tablet, Take 1 tablet (100 mg total) by mouth 2 (two) times daily.   rosuvastatin (CRESTOR) 10 MG tablet, Take 1 tablet (10 mg total) by mouth daily.  Current Outpatient Medications (Respiratory):    fexofenadine (ALLEGRA) 180 MG tablet, Take 180 mg by mouth daily as needed for allergies or rhinitis.   Current Outpatient Medications (Hematological):    apixaban (ELIQUIS) 5 MG TABS tablet,  Take 1 tablet (5 mg total) by mouth 2 (two) times daily.  Current Outpatient Medications (Other):    clonazePAM (KLONOPIN) 0.5 MG tablet, TAKE 1 AND 1/2 TABLETS(0.75 MG) BY MOUTH AT BEDTIME   MAGNESIUM PO, Take 750 mg by mouth as needed (constipation).   Reviewed prior external information including notes and imaging from  primary care provider As well as notes that were available from care everywhere and other healthcare systems.  Past medical history, social, surgical and family history all reviewed in electronic medical record.  No pertanent information unless stated regarding to the chief complaint.   Review of Systems:  No headache, visual changes, nausea, vomiting, diarrhea, constipation, dizziness, abdominal pain, skin rash, fevers, chills, night sweats, weight loss, swollen lymph nodes, body aches,  joint swelling, chest pain, shortness of breath, mood changes. POSITIVE muscle aches  Objective  Blood pressure 126/74, pulse (!) 53, height 6\' 1"  (1.854 m), weight 220 lb (99.8 kg), SpO2 98 %.   General: No apparent distress alert and oriented x3 mood and affect normal, dressed appropriately.  HEENT: Pupils equal, extraocular movements intact  Respiratory: Patient's speak in full sentences and does not appear short of breath  Cardiovascular: No lower extremity edema, non tender, no erythema  Right knee shows patient does have some tenderness over the medial joint space.  Trace effusion noted.  Patient lacks last 10 degrees of flexion.  Positive McMurray's sign.  Limited muscular skeletal ultrasound was performed and interpreted by Antoine Primas, M  Planning does have thickening of the synovium in the patellofemoral joint but does seem to be a significant synovitis with hypoechoic changes.  Patient does have acute chronic minimal displacement of the anterior medial meniscus. Impression: Anterior meniscal tear of the medial side.  After informed written and verbal consent, patient was seated on exam table. Right knee was prepped with alcohol swab and utilizing anterolateral approach, patient's right knee space was injected with 4:1  marcaine 0.5%: Kenalog 40mg /dL. Patient tolerated the procedure well without immediate complications.   Impression and Recommendations:     The above documentation has been reviewed and is accurate and complete Judi Saa, DO

## 2023-03-12 ENCOUNTER — Encounter: Payer: Self-pay | Admitting: Family Medicine

## 2023-03-12 ENCOUNTER — Ambulatory Visit: Payer: Medicare Other | Admitting: Family Medicine

## 2023-03-12 ENCOUNTER — Ambulatory Visit (INDEPENDENT_AMBULATORY_CARE_PROVIDER_SITE_OTHER): Payer: Medicare Other

## 2023-03-12 ENCOUNTER — Other Ambulatory Visit: Payer: Self-pay

## 2023-03-12 VITALS — BP 126/74 | HR 53 | Ht 73.0 in | Wt 220.0 lb

## 2023-03-12 DIAGNOSIS — S83241A Other tear of medial meniscus, current injury, right knee, initial encounter: Secondary | ICD-10-CM

## 2023-03-12 DIAGNOSIS — M25561 Pain in right knee: Secondary | ICD-10-CM

## 2023-03-12 DIAGNOSIS — M23301 Other meniscus derangements, unspecified lateral meniscus, left knee: Secondary | ICD-10-CM | POA: Insufficient documentation

## 2023-03-12 NOTE — Patient Instructions (Signed)
Do prescribed exercises at least 3x a week Injection today Xray today See you again in 6-7 weeks

## 2023-03-12 NOTE — Assessment & Plan Note (Signed)
Injection given, tolerated the procedure well.  Did have an underlying cellulitis as well.  X-rays are ordered to further evaluate.  Discussed which activities to do and which ones to avoid discussed increasing activity as tolerated.  Could consider potential bracing but will hold at this moment.  Worsening pain or any instability will need to consider the possibility of advanced imaging.  Likely some moderate arthritic changes but not severe enough to consider surgical intervention.  Follow-up again in 6 to 8 weeks

## 2023-03-21 DIAGNOSIS — S29012A Strain of muscle and tendon of back wall of thorax, initial encounter: Secondary | ICD-10-CM | POA: Diagnosis not present

## 2023-03-21 DIAGNOSIS — M9901 Segmental and somatic dysfunction of cervical region: Secondary | ICD-10-CM | POA: Diagnosis not present

## 2023-03-21 DIAGNOSIS — M9902 Segmental and somatic dysfunction of thoracic region: Secondary | ICD-10-CM | POA: Diagnosis not present

## 2023-03-21 DIAGNOSIS — S134XXA Sprain of ligaments of cervical spine, initial encounter: Secondary | ICD-10-CM | POA: Diagnosis not present

## 2023-03-23 DIAGNOSIS — M9902 Segmental and somatic dysfunction of thoracic region: Secondary | ICD-10-CM | POA: Diagnosis not present

## 2023-03-23 DIAGNOSIS — S134XXA Sprain of ligaments of cervical spine, initial encounter: Secondary | ICD-10-CM | POA: Diagnosis not present

## 2023-03-23 DIAGNOSIS — M9901 Segmental and somatic dysfunction of cervical region: Secondary | ICD-10-CM | POA: Diagnosis not present

## 2023-03-23 DIAGNOSIS — S29012A Strain of muscle and tendon of back wall of thorax, initial encounter: Secondary | ICD-10-CM | POA: Diagnosis not present

## 2023-05-18 NOTE — Progress Notes (Unsigned)
[facility] [address] [place] [address] Phone: [phone] Subjective:   Bruce Donath, am serving as a scribe for Dr. Antoine Primas.  I'm seeing this patient by the request  of:  Farris Has, MD  CC: knee pain   [id number]  03/12/2023 Injection given, tolerated the procedure well. Did have an underlying cellulitis as well. X-rays are ordered to further evaluate. Discussed which activities to do and which ones to avoid discussed increasing activity as tolerated. Could consider potential bracing but will hold at this moment. Worsening pain or any instability will need to consider the possibility of advanced imaging. Likely some moderate arthritic changes but not severe enough to consider surgical intervention. Follow-up again in 6 to 8 weeks   Updated 05/21/2023 Randy Cantu is a 72 y.o. male coming in with complaint of R knee pain, found to have a medial meniscus tear, given injection, HEP, and patient states that his knee is doing ok as long as he does not hyperextend. Less pain with twisting.   Tripped in his motor home, landing on his knee which caused pain in the R GT when his leg jammed with walking. Pain to walk and twist.       Past Medical History:  Diagnosis Date   Allergic rhinitis    HLD (hyperlipidemia)    Lactose intolerance    PAF (paroxysmal atrial fibrillation) (HCC)    s/p afib ablation 11/17/10   Periodic limb movement disorder (PLMD)    Right foot pain    all of foot 1--/2016   Past Surgical History:  Procedure Laterality Date   atrial fibrillation ablation  11/17/10   PVI by Dr Johney Frame   TONSILLECTOMY     Social History   Socioeconomic History   Marital status: Married    Spouse name: Not on file   Number of children: 2   Years of education: Not on file   Highest education level: Not on file  Occupational History   Occupation: DOCTOR/EYE    Employer: SELF EMPLOYED  Tobacco Use   Smoking status:  Never   Smokeless tobacco: Never  Vaping Use   Vaping status: Never Used  Substance and Sexual Activity   Alcohol use: Yes    Alcohol/week: 7.0 standard drinks of alcohol    Types: 7 Glasses of wine per week   Drug use: No   Sexual activity: Not on file  Other Topics Concern   Not on file  Social History Narrative   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   Social Determinants of Health   Financial Resource Strain: Not on file  Food Insecurity: Not on file  Transportation Needs: Not on file  Physical Activity: Not on file  Stress: Not on file  Social Connections: Not on file   Allergies  Allergen Reactions   Pantoprazole Sodium     REACTION: rash from this or from  topical disinfectant   Family History  Problem Relation Age of Onset   Peripheral vascular disease Mother        afib in her 73s   Aplastic anemia Father    Cancer Other    Prostate cancer Maternal Grandfather    Leukemia Paternal Grandfather    Colon cancer Neg Hx      Current Outpatient Medications (Cardiovascular):    diltiazem (CARDIZEM CD) 120 MG 24 hr capsule, Take 1 capsule (120 mg total) by mouth daily.  flecainide (TAMBOCOR) 100 MG tablet, Take 1 tablet (100 mg total) by mouth 2 (two) times daily.   rosuvastatin (CRESTOR) 10 MG tablet, Take 1 tablet (10 mg total) by mouth daily.  Current Outpatient Medications (Respiratory):    fexofenadine (ALLEGRA) 180 MG tablet, Take 180 mg by mouth daily as needed for allergies or rhinitis.   Current Outpatient Medications (Hematological):    apixaban (ELIQUIS) 5 MG TABS tablet, Take 1 tablet (5 mg total) by mouth 2 (two) times daily.  Current Outpatient Medications (Other):    clonazePAM (KLONOPIN) 0.5 MG tablet, TAKE 1 AND 1/2 TABLETS(0.75 MG) BY MOUTH AT BEDTIME   MAGNESIUM PO, Take 750 mg by mouth as needed (constipation).   Reviewed prior external information including notes and imaging from  primary  care provider As well as notes that were available from care everywhere and other healthcare systems.  Past medical history, social, surgical and family history all reviewed in electronic medical record.  No pertanent information unless stated regarding to the chief complaint.   Review of Systems:  No headache, visual changes, nausea, vomiting, diarrhea, constipation, dizziness, abdominal pain, skin rash, fevers, chills, night sweats, weight loss, swollen lymph nodes, body aches, joint swelling, chest pain, shortness of breath, mood changes. POSITIVE muscle aches  Objective  There were no vitals taken for this visit.   General: No apparent distress alert and oriented x3 mood and affect normal, dressed appropriately.  HEENT: Pupils equal, extraocular movements intact  Respiratory: Patient's speak in full sentences and does not appear short of breath  Cardiovascular: No lower extremity edema, non tender, no erythema   Knee exam shows   Limited muscular skeletal ultrasound was performed and interpreted by Antoine Primas, M     Impression and Recommendations:     The above documentation has been reviewed and is accurate and complete Judi Saa, DO

## 2023-05-21 ENCOUNTER — Ambulatory Visit (INDEPENDENT_AMBULATORY_CARE_PROVIDER_SITE_OTHER): Payer: Medicare Other

## 2023-05-21 ENCOUNTER — Ambulatory Visit: Payer: Medicare Other | Admitting: Family Medicine

## 2023-05-21 VITALS — BP 116/76 | HR 51 | Ht 73.0 in | Wt 220.0 lb

## 2023-05-21 DIAGNOSIS — M25551 Pain in right hip: Secondary | ICD-10-CM

## 2023-05-21 DIAGNOSIS — M545 Low back pain, unspecified: Secondary | ICD-10-CM | POA: Diagnosis not present

## 2023-05-21 DIAGNOSIS — M160 Bilateral primary osteoarthritis of hip: Secondary | ICD-10-CM | POA: Diagnosis not present

## 2023-05-21 DIAGNOSIS — M7061 Trochanteric bursitis, right hip: Secondary | ICD-10-CM

## 2023-05-21 DIAGNOSIS — M5136 Other intervertebral disc degeneration, lumbar region: Secondary | ICD-10-CM | POA: Diagnosis not present

## 2023-05-21 DIAGNOSIS — S83241A Other tear of medial meniscus, current injury, right knee, initial encounter: Secondary | ICD-10-CM

## 2023-05-21 DIAGNOSIS — M47816 Spondylosis without myelopathy or radiculopathy, lumbar region: Secondary | ICD-10-CM | POA: Diagnosis not present

## 2023-05-21 NOTE — Patient Instructions (Addendum)
Xray today Injected knee and hip Injected both today See me again in 6-8 weeks

## 2023-05-22 ENCOUNTER — Encounter: Payer: Self-pay | Admitting: Family Medicine

## 2023-05-22 DIAGNOSIS — M7061 Trochanteric bursitis, right hip: Secondary | ICD-10-CM | POA: Insufficient documentation

## 2023-05-22 NOTE — Assessment & Plan Note (Signed)
Patient given injection and tolerated the procedure well.  Acute new onset injury after fall.  Likely now compensating for some of the right knee.  Discussed hip abductor strengthening, differential includes lumbar radiculopathy.  Discussed with patient's other comorbidities and medications to avoid oral anti-inflammatories regularly.  Discussed icing regimen.  Follow-up again in 6 to 8 weeks

## 2023-05-22 NOTE — Assessment & Plan Note (Signed)
Chronic problem, subsequent encounter, no significant improvement, injection given today.  Tolerated the procedure well.  Hopeful that this will make a significant difference in his healing process.  Still avoiding twisting motions and continue on VMO strengthening.  Follow-up again in 6 to 8 weeks worsening pain or no improvement advanced imaging will be warranted

## 2023-06-01 DIAGNOSIS — M9901 Segmental and somatic dysfunction of cervical region: Secondary | ICD-10-CM | POA: Diagnosis not present

## 2023-06-01 DIAGNOSIS — S29012A Strain of muscle and tendon of back wall of thorax, initial encounter: Secondary | ICD-10-CM | POA: Diagnosis not present

## 2023-06-01 DIAGNOSIS — M9902 Segmental and somatic dysfunction of thoracic region: Secondary | ICD-10-CM | POA: Diagnosis not present

## 2023-06-01 DIAGNOSIS — S134XXA Sprain of ligaments of cervical spine, initial encounter: Secondary | ICD-10-CM | POA: Diagnosis not present

## 2023-06-07 DIAGNOSIS — L0291 Cutaneous abscess, unspecified: Secondary | ICD-10-CM | POA: Diagnosis not present

## 2023-06-13 ENCOUNTER — Ambulatory Visit: Payer: Medicare Other | Admitting: Family Medicine

## 2023-06-21 DIAGNOSIS — Z125 Encounter for screening for malignant neoplasm of prostate: Secondary | ICD-10-CM | POA: Diagnosis not present

## 2023-06-21 DIAGNOSIS — N5201 Erectile dysfunction due to arterial insufficiency: Secondary | ICD-10-CM | POA: Diagnosis not present

## 2023-06-21 DIAGNOSIS — R3912 Poor urinary stream: Secondary | ICD-10-CM | POA: Diagnosis not present

## 2023-07-03 NOTE — Progress Notes (Unsigned)
[facility] [address] [place] [address] Phone: [phone] Subjective:   [name], am serving as a scribe for Dr. Antoine Primas.  I'm seeing this patient by the request  of:  Farris Has, MD  CC: Knee pain and hip pain follow-up  KGM:[id number]  05/21/2023 Patient given injection and tolerated the procedure well.  Acute new onset injury after fall.  Likely now compensating for some of the right knee.  Discussed hip abductor strengthening, differential includes lumbar radiculopathy.  Discussed with patient's other comorbidities and medications to avoid oral anti-inflammatories regularly.  Discussed icing regimen.  Follow-up again in 6 to 8 weeks     Chronic problem, subsequent encounter, no significant improvement, injection given today.  Tolerated the procedure well.  Hopeful that this will make a significant difference in his healing process.  Still avoiding twisting motions and continue on VMO strengthening.  Follow-up again in 6 to 8 weeks worsening pain or no improvement advanced imaging will be warranted     Updated 07/04/2023 BALAM MERCEDES is a 72 y.o. male coming in with complaint of R hip and knee pain, was given an injection previously On July 15 for the greater trochanteric bursitis.  And on the same date also given injection for the acute medial meniscal tear of the right knee.  Patient was to continue with home exercises.  Patient states knee is doing very well. Hip will wake him in the middle of the night and when sitting in figure 4 it will also hurt. No stopping him from activity.      Past Medical History:  Diagnosis Date   Allergic rhinitis    HLD (hyperlipidemia)    Lactose intolerance    PAF (paroxysmal atrial fibrillation) (HCC)    s/p afib ablation 11/17/10   Periodic limb movement disorder (PLMD)    Right foot pain    all of foot 1--/2016   Past Surgical History:  Procedure Laterality Date   atrial  fibrillation ablation  11/17/10   PVI by Dr Johney Frame   TONSILLECTOMY     Social History   Socioeconomic History   Marital status: Married    Spouse name: Not on file   Number of children: 2   Years of education: Not on file   Highest education level: Not on file  Occupational History   Occupation: DOCTOR/EYE    Employer: SELF EMPLOYED  Tobacco Use   Smoking status: Never   Smokeless tobacco: Never  Vaping Use   Vaping status: Never Used  Substance and Sexual Activity   Alcohol use: Yes    Alcohol/week: 7.0 standard drinks of alcohol    Types: 7 Glasses of wine per week   Drug use: No   Sexual activity: Not on file  Other Topics Concern   Not on file  Social History Narrative   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   Social Determinants of Health   Financial Resource Strain: Not on file  Food Insecurity: Not on file  Transportation Needs: Not on file  Physical Activity: Not on file  Stress: Not on file  Social Connections: Not on file   Allergies  Allergen Reactions   Pantoprazole Sodium     REACTION: rash from this or from  topical disinfectant   Family History  Problem Relation Age of Onset   Peripheral vascular disease Mother        afib in  her 37s   Aplastic anemia Father    Cancer Other    Prostate cancer Maternal Grandfather    Leukemia Paternal Grandfather    Colon cancer Neg Hx      Current Outpatient Medications (Cardiovascular):    diltiazem (CARDIZEM CD) 120 MG 24 hr capsule, Take 1 capsule (120 mg total) by mouth daily.   flecainide (TAMBOCOR) 100 MG tablet, Take 1 tablet (100 mg total) by mouth 2 (two) times daily.   rosuvastatin (CRESTOR) 10 MG tablet, Take 1 tablet (10 mg total) by mouth daily.  Current Outpatient Medications (Respiratory):    fexofenadine (ALLEGRA) 180 MG tablet, Take 180 mg by mouth daily as needed for allergies or rhinitis.   Current Outpatient Medications (Hematological):     apixaban (ELIQUIS) 5 MG TABS tablet, Take 1 tablet (5 mg total) by mouth 2 (two) times daily.  Current Outpatient Medications (Other):    clonazePAM (KLONOPIN) 0.5 MG tablet, TAKE 1 AND 1/2 TABLETS(0.75 MG) BY MOUTH AT BEDTIME   MAGNESIUM PO, Take 750 mg by mouth as needed (constipation).   Reviewed prior external information including notes and imaging from  primary care provider As well as notes that were available from care everywhere and other healthcare systems.  Past medical history, social, surgical and family history all reviewed in electronic medical record.  No pertanent information unless stated regarding to the chief complaint.   Review of Systems:  No headache, visual changes, nausea, vomiting, diarrhea, constipation, dizziness, abdominal pain, skin rash, fevers, chills, night sweats, weight loss, swollen lymph nodes, body aches, joint swelling, chest pain, shortness of breath, mood changes. POSITIVE muscle aches  Objective  Height 6\' 1"  (1.854 m), weight 218 lb (98.9 kg).   General: No apparent distress alert and oriented x3 mood and affect normal, dressed appropriately.  HEENT: Pupils equal, extraocular movements intact  Respiratory: Patient's speak in full sentences and does not appear short of breath  Cardiovascular: No lower extremity edema, non tender, no erythema  Hip exam shows still some tightness noted on the right side of the hip.  Nothing severe though.  Negative straight leg test.  Knee exam shows no significant tenderness.  Still a trace effusion noted but significant improvement from previous exam.  Negative McMurray's today. Limited muscular skeletal ultrasound was performed and interpreted by Antoine Primas, M  Limited ultrasound shows very minimal displacement of the medial meniscus and still the tearing noted.  Very small effusion noted of the patellofemoral joint. Impression: Interval improvement     Impression and Recommendations:     The above  documentation has been reviewed and is accurate and complete Judi Saa, DO

## 2023-07-04 ENCOUNTER — Other Ambulatory Visit: Payer: Self-pay

## 2023-07-04 ENCOUNTER — Ambulatory Visit: Payer: Medicare Other | Admitting: Family Medicine

## 2023-07-04 ENCOUNTER — Encounter: Payer: Self-pay | Admitting: Family Medicine

## 2023-07-04 VITALS — Ht 73.0 in | Wt 218.0 lb

## 2023-07-04 DIAGNOSIS — S83241A Other tear of medial meniscus, current injury, right knee, initial encounter: Secondary | ICD-10-CM

## 2023-07-04 NOTE — Patient Instructions (Signed)
You are making great progress The knee looks great  Hip will take more time but could try injection in the tendon if needed at a later date Ice after a lot of activity  Enjoy the grand baby! See me again in 3 months

## 2023-07-04 NOTE — Assessment & Plan Note (Signed)
Significant improvement noted.  Last swelling noted in the knee itself.  Patient does not have as much displacement either.

## 2023-07-17 DIAGNOSIS — K080 Exfoliation of teeth due to systemic causes: Secondary | ICD-10-CM | POA: Diagnosis not present

## 2023-08-09 ENCOUNTER — Other Ambulatory Visit: Payer: Self-pay | Admitting: Neurology

## 2023-08-09 DIAGNOSIS — G4752 REM sleep behavior disorder: Secondary | ICD-10-CM

## 2023-08-10 ENCOUNTER — Other Ambulatory Visit: Payer: Self-pay | Admitting: Neurology

## 2023-08-10 DIAGNOSIS — G4752 REM sleep behavior disorder: Secondary | ICD-10-CM

## 2023-08-22 DIAGNOSIS — E785 Hyperlipidemia, unspecified: Secondary | ICD-10-CM | POA: Diagnosis not present

## 2023-08-22 DIAGNOSIS — I4891 Unspecified atrial fibrillation: Secondary | ICD-10-CM | POA: Diagnosis not present

## 2023-08-22 DIAGNOSIS — Z23 Encounter for immunization: Secondary | ICD-10-CM | POA: Diagnosis not present

## 2023-08-22 DIAGNOSIS — Z0000 Encounter for general adult medical examination without abnormal findings: Secondary | ICD-10-CM | POA: Diagnosis not present

## 2023-09-25 NOTE — Progress Notes (Unsigned)
[facility] [address] [place] [address] Phone: [phone] Subjective:   Randy Cantu, am serving as a scribe for Dr. Antoine Primas.  I'm seeing this patient by the request  of:  Farris Has, MD  CC: Left knee, right hip  ION:[id number]  07/04/2023 Significant improvement noted. Last swelling noted in the knee itself. Patient does not have as much displacement either.   Update 09/26/2023 Randy Cantu is a 72 y.o. male coming in with complaint of R knee pain. Patient states knees are an on and off pain. Wanting to know should he exercise or not. R hip is more concerning than knee pain. Hip started hurting when he fell on right knee. Hip is getting progressively worse.       Past Medical History:  Diagnosis Date   Allergic rhinitis    HLD (hyperlipidemia)    Lactose intolerance    PAF (paroxysmal atrial fibrillation) (HCC)    s/p afib ablation 11/17/10   Periodic limb movement disorder (PLMD)    Right foot pain    all of foot 1--/2016   Past Surgical History:  Procedure Laterality Date   atrial fibrillation ablation  11/17/10   PVI by Dr Johney Frame   TONSILLECTOMY     Social History   Socioeconomic History   Marital status: Married    Spouse name: Not on file   Number of children: 2   Years of education: Not on file   Highest education level: Not on file  Occupational History   Occupation: DOCTOR/EYE    Employer: SELF EMPLOYED  Tobacco Use   Smoking status: Never   Smokeless tobacco: Never  Vaping Use   Vaping status: Never Used  Substance and Sexual Activity   Alcohol use: Yes    Alcohol/week: 7.0 standard drinks of alcohol    Types: 7 Glasses of wine per week   Drug use: No   Sexual activity: Not on file  Other Topics Concern   Not on file  Social History Narrative   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   Social Determinants of Health   Financial Resource  Strain: Not on file  Food Insecurity: Not on file  Transportation Needs: Not on file  Physical Activity: Not on file  Stress: Not on file  Social Connections: Not on file   Allergies  Allergen Reactions   Pantoprazole Sodium     REACTION: rash from this or from  topical disinfectant   Family History  Problem Relation Age of Onset   Peripheral vascular disease Mother        afib in her 71s   Aplastic anemia Father    Cancer Other    Prostate cancer Maternal Grandfather    Leukemia Paternal Grandfather    Colon cancer Neg Hx      Current Outpatient Medications (Cardiovascular):    diltiazem (CARDIZEM CD) 120 MG 24 hr capsule, Take 1 capsule (120 mg total) by mouth daily.   flecainide (TAMBOCOR) 100 MG tablet, Take 1 tablet (100 mg total) by mouth 2 (two) times daily.   rosuvastatin (CRESTOR) 10 MG tablet, Take 1 tablet (10 mg total) by mouth daily.  Current Outpatient Medications (Respiratory):    fexofenadine (ALLEGRA) 180 MG tablet, Take 180 mg by mouth daily as needed for allergies or rhinitis.   Current Outpatient Medications (Hematological):    apixaban (ELIQUIS) 5 MG TABS tablet, Take 1 tablet (5  mg total) by mouth 2 (two) times daily.  Current Outpatient Medications (Other):    clonazePAM (KLONOPIN) 0.5 MG tablet, TAKE ONE AND ONE-HALF TABLETS BY MOUTH AT BEDTIME   MAGNESIUM PO, Take 750 mg by mouth as needed (constipation).   Reviewed prior external information including notes and imaging from  primary care provider As well as notes that were available from care everywhere and other healthcare systems.  Past medical history, social, surgical and family history all reviewed in electronic medical record.  No pertanent information unless stated regarding to the chief complaint.   Review of Systems:  No headache, visual changes, nausea, vomiting, diarrhea, constipation, dizziness, abdominal pain, skin rash, fevers, chills, night sweats, weight loss, swollen lymph  nodes, body aches, joint swelling, chest pain, shortness of breath, mood changes. POSITIVE muscle aches  Objective  There were no vitals taken for this visit.   General: No apparent distress alert and oriented x3 mood and affect normal, dressed appropriately.  HEENT: Pupils equal, extraocular movements intact  Respiratory: Patient's speak in full sentences and does not appear short of breath  Cardiovascular: No lower extremity edema, non tender, no erythema  Patient's left knee does have tenderness to palpation noted.  More over the lateral aspect.  Trace effusion noted.  Right hip does have severe tenderness to palpation over the gluteal tendon near the insertion on the greater trochanteric area.    Procedure: Real-time Ultrasound Guided Injection of right greater trochanteric bursitis secondary to patient's body habitus Device: GE Logiq Q7 Ultrasound guided injection is preferred based studies that show increased duration, increased effect, greater accuracy, decreased procedural pain, increased response rate, and decreased cost with ultrasound guided versus blind injection.  Verbal informed consent obtained.  Time-out conducted.  Noted no overlying erythema, induration, or other signs of local infection.  Skin prepped in a sterile fashion.  Local anesthesia: Topical Ethyl chloride.  With sterile technique and under real time ultrasound guidance:  Greater trochanteric area was visualized and patient's bursa was noted. A 22-gauge 3 inch needle was inserted and 4 cc of 0.5% Marcaine and 1 cc of Kenalog 40 mg/dL was injected. Pictures taken Completed without difficulty  Pain immediately resolved suggesting accurate placement of the medication.  Advised to call if fevers/chills, erythema, induration, drainage, or persistent bleeding.  Impression: Technically successful ultrasound guided injection.  After informed written and verbal consent, patient was seated on exam table. Left knee was  prepped with alcohol swab and utilizing anterolateral approach, patient's left knee space was injected with 4:1  marcaine 0.5%: Kenalog 40mg /dL. Patient tolerated the procedure well without immediate complications.     Impression and Recommendations:

## 2023-09-26 ENCOUNTER — Ambulatory Visit: Payer: Medicare Other | Admitting: Family Medicine

## 2023-09-26 ENCOUNTER — Other Ambulatory Visit: Payer: Self-pay

## 2023-09-26 ENCOUNTER — Encounter: Payer: Self-pay | Admitting: Family Medicine

## 2023-09-26 VITALS — BP 110/70 | HR 65 | Ht 73.0 in | Wt 219.0 lb

## 2023-09-26 DIAGNOSIS — M7061 Trochanteric bursitis, right hip: Secondary | ICD-10-CM

## 2023-09-26 DIAGNOSIS — M23301 Other meniscus derangements, unspecified lateral meniscus, left knee: Secondary | ICD-10-CM | POA: Diagnosis not present

## 2023-09-26 DIAGNOSIS — M25551 Pain in right hip: Secondary | ICD-10-CM

## 2023-09-26 NOTE — Assessment & Plan Note (Signed)
Chronic problem with worsening symptoms.  Discussed icing regimen and home exercises, which activities to do and which ones to avoid.  Increase activity slowly.  Follow-up again in 6 to 8 weeks

## 2023-09-26 NOTE — Assessment & Plan Note (Signed)
Chronic problem with worsening symptoms.  Should respond well to the injections.  Will see how patient responds and if any worsening symptoms will need to consider the possibility of advance imaging.

## 2023-09-26 NOTE — Patient Instructions (Addendum)
Injection in in hip and knee today Happy Holidays See you again in 2-3 months

## 2023-10-09 DIAGNOSIS — J029 Acute pharyngitis, unspecified: Secondary | ICD-10-CM | POA: Diagnosis not present

## 2023-10-09 DIAGNOSIS — R001 Bradycardia, unspecified: Secondary | ICD-10-CM | POA: Diagnosis not present

## 2023-11-22 DIAGNOSIS — J069 Acute upper respiratory infection, unspecified: Secondary | ICD-10-CM | POA: Diagnosis not present

## 2023-11-22 DIAGNOSIS — R051 Acute cough: Secondary | ICD-10-CM | POA: Diagnosis not present

## 2023-11-22 DIAGNOSIS — I4891 Unspecified atrial fibrillation: Secondary | ICD-10-CM | POA: Diagnosis not present

## 2023-11-22 DIAGNOSIS — Z03818 Encounter for observation for suspected exposure to other biological agents ruled out: Secondary | ICD-10-CM | POA: Diagnosis not present

## 2023-11-27 ENCOUNTER — Other Ambulatory Visit: Payer: Self-pay | Admitting: Neurology

## 2023-11-27 DIAGNOSIS — G4752 REM sleep behavior disorder: Secondary | ICD-10-CM

## 2023-11-27 MED ORDER — CLONAZEPAM 0.5 MG PO TABS
ORAL_TABLET | ORAL | 1 refills | Status: DC
Start: 2023-11-27 — End: 2024-03-14

## 2023-11-27 NOTE — Telephone Encounter (Signed)
I have routed this request to Dr Dohmeier for review. The pt is due for the medication and  registry was verified.  

## 2023-11-27 NOTE — Telephone Encounter (Signed)
Pt is requesting a refill for clonazePAM (KLONOPIN) 0.5 MG tablet .  Pharmacy: [facility] [phone]

## 2023-11-29 DIAGNOSIS — J101 Influenza due to other identified influenza virus with other respiratory manifestations: Secondary | ICD-10-CM | POA: Diagnosis not present

## 2023-11-29 DIAGNOSIS — I4891 Unspecified atrial fibrillation: Secondary | ICD-10-CM | POA: Diagnosis not present

## 2023-12-03 ENCOUNTER — Ambulatory Visit: Payer: Medicare Other | Attending: Internal Medicine | Admitting: Internal Medicine

## 2023-12-03 ENCOUNTER — Encounter: Payer: Self-pay | Admitting: Internal Medicine

## 2023-12-03 VITALS — BP 114/78 | HR 49 | Ht 73.0 in | Wt 218.4 lb

## 2023-12-03 DIAGNOSIS — R001 Bradycardia, unspecified: Secondary | ICD-10-CM | POA: Diagnosis not present

## 2023-12-03 DIAGNOSIS — E785 Hyperlipidemia, unspecified: Secondary | ICD-10-CM | POA: Diagnosis not present

## 2023-12-03 DIAGNOSIS — I480 Paroxysmal atrial fibrillation: Secondary | ICD-10-CM | POA: Diagnosis not present

## 2023-12-03 DIAGNOSIS — Z79899 Other long term (current) drug therapy: Secondary | ICD-10-CM

## 2023-12-03 MED ORDER — ROSUVASTATIN CALCIUM 10 MG PO TABS: 10.0000 mg | ORAL_TABLET | Freq: Every day | ORAL | 3 refills | Status: DC

## 2023-12-03 MED ORDER — FLECAINIDE ACETATE 100 MG PO TABS: 100.0000 mg | ORAL_TABLET | Freq: Two times a day (BID) | ORAL | 3 refills | Status: DC

## 2023-12-03 MED ORDER — DILTIAZEM HCL ER COATED BEADS 120 MG PO CP24
120.0000 mg | ORAL_CAPSULE | Freq: Every day | ORAL | 3 refills | Status: DC
Start: 2023-12-03 — End: 2024-05-12

## 2023-12-03 MED ORDER — APIXABAN 5 MG PO TABS
5.0000 mg | ORAL_TABLET | Freq: Two times a day (BID) | ORAL | 3 refills | Status: DC
Start: 2023-12-03 — End: 2024-05-20

## 2023-12-03 NOTE — Patient Instructions (Addendum)
Medication Instructions:  Your physician recommends that you continue on your current medications as directed. Please refer to the Current Medication list given to you today.  *If you need a refill on your cardiac medications before your next appointment, please call your pharmacy*   Lab Work: BMET today If you have labs (blood work) drawn today and your tests are completely normal, you will receive your results only by: MyChart Message (if you have MyChart) OR A paper copy in the mail If you have any lab test that is abnormal or we need to change your treatment, we will call you to review the results.   Testing/Procedures: None ordered.    Follow-Up: At [facility], you and your health needs are our priority.  As part of our continuing mission to provide you with exceptional heart care, we have created designated Provider Care Teams.  These Care Teams include your primary Cardiologist (physician) and Advanced Practice Providers (APPs -  Physician Assistants and Nurse Practitioners) who all work together to provide you with the care you need, when you need it.  We recommend signing up for the patient portal called "MyChart".  Sign up information is provided on this After Visit Summary.  MyChart is used to connect with patients for Virtual Visits (Telemedicine).  Patients are able to view lab/test results, encounter notes, upcoming appointments, etc.  Non-urgent messages can be sent to your provider as well.   To learn more about what you can do with MyChart, go to [email or web address].    Your next appointment:   12 months

## 2023-12-03 NOTE — Progress Notes (Signed)
Patient Care Team: Farris Has, MD as PCP - General (Family Medicine) Duke Salvia, MD as Consulting Physician (Cardiology)   HPI  Randy Cantu is a 73 y.o. male Seen in follow-up for atrial fibrillation managed with a combination of verapamil and flecainide.  He is status post PVI 1/12-JA  DATE PR interval QRSduration Flecainide HR   2009 19 90 0   12/18 208 108 150   6/19 222 114 150 56  6/20 224 106 150 44  11/22 218 110 150 50  11/23 232 102 150/100 49  10/24 210 104 100/100 49  The patient denies chest pain, shortness of breath, nocturnal dyspnea, orthopnea or peripheral edema.  There have been no, lightheadedness or syncope.  Complains of palpitations but sporadic--hard and regular.   On Apixaban  no  bleeding     DATE TEST EF   24/21 Echo   65 % Asymmetric septal hypertrophy 17/11;  BAE mild PA mildly elevated   7/21 cMRI  67 % Asymmetric Septal hypertrophy 38mm/7 mm LGE neg   12/23 CTA  CaScore 278 No obstructive CAD     Date Cr K Hgb LDL   11/22    77  11/23 1.21 4.8 15.7    10/24  4.3 16.7      Thromboembolic risk factors ( age -28) for a CHADSVASc Score of 1    Records and Results Reviewed  Past Medical History:  Diagnosis Date   Allergic rhinitis    HLD (hyperlipidemia)    Lactose intolerance    PAF (paroxysmal atrial fibrillation) (HCC)    s/p afib ablation 11/17/10   Periodic limb movement disorder (PLMD)    Right foot pain    all of foot 1--/2016    Past Surgical History:  Procedure Laterality Date   atrial fibrillation ablation  11/17/10   PVI by Dr Johney Frame   TONSILLECTOMY      Current Outpatient Medications  Medication Sig Dispense Refill   clonazePAM (KLONOPIN) 0.5 MG tablet TAKE ONE AND ONE-HALF TABLETS BY MOUTH AT BEDTIME 135 tablet 1   fexofenadine (ALLEGRA) 180 MG tablet Take 180 mg by mouth daily as needed for allergies or rhinitis.     MAGNESIUM PO Take 750 mg by mouth as needed (constipation).     apixaban  (ELIQUIS) 5 MG TABS tablet Take 1 tablet (5 mg total) by mouth 2 (two) times daily. 180 tablet 3   diltiazem (CARDIZEM CD) 120 MG 24 hr capsule Take 1 capsule (120 mg total) by mouth daily. 90 capsule 3   flecainide (TAMBOCOR) 100 MG tablet Take 1 tablet (100 mg total) by mouth 2 (two) times daily. 180 tablet 3   rosuvastatin (CRESTOR) 10 MG tablet Take 1 tablet (10 mg total) by mouth daily. 90 tablet 3   No current facility-administered medications for this visit.    Allergies  Allergen Reactions   Pantoprazole Sodium     REACTION: rash from this or from  topical disinfectant      Review of Systems negative except from HPI and PMH  Physical Exam BP 114/78   Pulse (!) 49   Ht 6\' 1"  (1.854 m)   Wt 218 lb 6.4 oz (99.1 kg)   SpO2 97%   BMI 28.81 kg/m  Well developed and nourished in no acute distress HENT normal Neck supple with JVP-  flat   Clear Regular rate and rhythm, no murmurs or gallops Abd-soft with active BS No Clubbing cyanosis  edema Skin-warm and dry A & Oriented  Grossly normal sensory and motor function  ECG sinus @ 49     Assessment and  Plan  Atrial fib paroxysmal  Sinus bradycardia  Hyperlipidemia  Septal hypertrophy  Coronary artery calcification calcium score 278 with nonobstructive disease   Occasional palpitations.  Was wondering about stopping his medications, have elected to continue for now.  With the flecainide and the diltiazem  Occasional dyspnea.  Getting ready to go skiing.  Continue Eliquis for anticoagulation and his statin for his coronary artery calcification

## 2023-12-12 ENCOUNTER — Ambulatory Visit: Payer: Medicare Other | Admitting: Family Medicine

## 2023-12-25 DIAGNOSIS — D225 Melanocytic nevi of trunk: Secondary | ICD-10-CM | POA: Diagnosis not present

## 2023-12-25 DIAGNOSIS — D2262 Melanocytic nevi of left upper limb, including shoulder: Secondary | ICD-10-CM | POA: Diagnosis not present

## 2023-12-25 DIAGNOSIS — D2261 Melanocytic nevi of right upper limb, including shoulder: Secondary | ICD-10-CM | POA: Diagnosis not present

## 2023-12-25 DIAGNOSIS — L821 Other seborrheic keratosis: Secondary | ICD-10-CM | POA: Diagnosis not present

## 2023-12-25 DIAGNOSIS — D485 Neoplasm of uncertain behavior of skin: Secondary | ICD-10-CM | POA: Diagnosis not present

## 2023-12-25 DIAGNOSIS — L910 Hypertrophic scar: Secondary | ICD-10-CM | POA: Diagnosis not present

## 2023-12-27 ENCOUNTER — Ambulatory Visit: Payer: Medicare Other | Admitting: Neurology

## 2024-01-08 ENCOUNTER — Encounter: Payer: Self-pay | Admitting: Neurology

## 2024-01-08 ENCOUNTER — Ambulatory Visit: Payer: Medicare Other | Admitting: Neurology

## 2024-01-08 VITALS — BP 120/70 | HR 52 | Ht 72.0 in | Wt 217.0 lb

## 2024-01-08 DIAGNOSIS — I4811 Longstanding persistent atrial fibrillation: Secondary | ICD-10-CM | POA: Diagnosis not present

## 2024-01-08 DIAGNOSIS — R0683 Snoring: Secondary | ICD-10-CM

## 2024-01-08 DIAGNOSIS — G4752 REM sleep behavior disorder: Secondary | ICD-10-CM

## 2024-01-08 NOTE — Patient Instructions (Addendum)
 I noticed a slightly masked face, and he had fallen playing pickle ball, injured the left shoulder and elbow.  Left hand dominant.   Left finger shaking.  Left biceps tremor, cogwheeling. He had injured his shoulder and elbow too, he is left hand dominant.   Shuffling gait.  His golf game is declining, and his handwriting. No resting tremor.   REM BD -  more agitated sleep talking.      1) I will order a sleep study , parasomnia montage .  He has been snoring, he has rhinitis.    Mrs Kamara would like a sleep study for herself, awaiting PCP referral.  I refilled KLONOPIN>   2)  DAT scan ordered. I suspect PD .   3) Parkinsonism more evident.  This is the first time his facial expression seems reduced.   4) I will consider starting  a dopamine medication if the need arises.    I plan to follow up either personally or through our NP within 4-6 months.  I like to have a MOCA test at that time  Tremor A tremor is trembling or shaking that you cannot control. Most tremors affect the hands or arms. Tremors can also affect the head, vocal cords, face, and other parts of the body. There are many types of tremors. Common types include: Essential tremor. These usually occur in people older than 40. This type of tremor may run in families and can happen in otherwise healthy people. Resting tremor. These occur when the muscles are at rest, such as when your hands are resting in your lap. People with Parkinson's disease often have resting tremors. Postural tremor. These occur when you try to hold a pose, such as keeping your hands outstretched. Kinetic tremor. These occur during purposeful movement, such as trying to touch a finger to your nose. Task-specific tremor. These may occur when you do certain tasks such as writing, speaking, or standing. Psychogenic tremor. These are greatly reduced or go away when you are distracted. These tremors happen due to underlying stress or psychiatric disease.  They can happen in people of all ages. Some types of tremors have no known cause. Tremors can also be a symptom of nervous system problems (neurological disorders) that may occur with aging. Some tremors go away with treatment, while others do not. Follow these instructions at home: Lifestyle     If you drink alcohol: Limit how much you have to: 0-1 drink a day for women who are not pregnant. 0-2 drinks a day for men. Know how much alcohol is in a drink. In the [place] one drink equals one 12 oz bottle of beer (355 mL), one 5 oz glass of wine (148 mL), or one 1 oz glass of hard liquor (44 mL). Do not use any products that contain nicotine or tobacco. These products include cigarettes, chewing tobacco, and vaping devices, such as e-cigarettes. If you need help quitting, ask your health care provider. Avoid extreme heat and extreme cold. Limit your caffeine intake, as told by your health care provider. Try to get 8 hours of sleep each night. Find ways to manage your stress, such as meditation or yoga. General instructions Take over-the-counter and prescription medicines only as told by your health care provider. Keep all follow-up visits. This is important. Contact a health care provider if: You develop a tremor after starting a new medicine. You have a tremor along with other symptoms such as: Numbness. Tingling. Pain. Weakness. Your tremor gets worse. Your  tremor interferes with your day-to-day life. Summary A tremor is trembling or shaking that you cannot control. Most tremors affect the hands or arms. Some types of tremors have no known cause. Others may be a symptom of nervous system problems (neurological disorders). Make sure you discuss any tremors you have with your health care provider. This information is not intended to replace advice given to you by your health care provider. Make sure you discuss any questions you have with your health care provider. Document Revised:  08/12/2021 Document Reviewed: 08/12/2021 Elsevier Patient Education  2024 [employer].
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
## 2024-01-09 ENCOUNTER — Telehealth: Payer: Self-pay | Admitting: Neurology

## 2024-01-09 NOTE — Telephone Encounter (Signed)
 BCBS medicare Berkley Harvey: [id number] exp. 01/09/24-02/07/24 sent to [facility] [phone]

## 2024-01-21 ENCOUNTER — Telehealth: Payer: Self-pay | Admitting: Neurology

## 2024-01-21 NOTE — Telephone Encounter (Signed)
 LVM for pt to call back to schedule   BCBS medicare no auth req via [employer]

## 2024-01-24 NOTE — Telephone Encounter (Signed)
 Patient called back.  [employer] [employer] medicare no auth req.  Patient is scheduled at [facility] for 429/25 at 9 pm.  Mailed packet & sent mychart.

## 2024-01-31 ENCOUNTER — Encounter (HOSPITAL_COMMUNITY)
Admission: RE | Admit: 2024-01-31 | Discharge: 2024-01-31 | Disposition: A | Discharge status: Home / Self Care | Source: Ambulatory Visit | Attending: Neurology | Admitting: Neurology

## 2024-01-31 DIAGNOSIS — I4811 Longstanding persistent atrial fibrillation: Secondary | ICD-10-CM | POA: Insufficient documentation

## 2024-01-31 DIAGNOSIS — G4752 REM sleep behavior disorder: Secondary | ICD-10-CM | POA: Insufficient documentation

## 2024-01-31 DIAGNOSIS — R269 Unspecified abnormalities of gait and mobility: Secondary | ICD-10-CM | POA: Diagnosis not present

## 2024-01-31 DIAGNOSIS — R0683 Snoring: Secondary | ICD-10-CM | POA: Diagnosis not present

## 2024-01-31 MED ORDER — POTASSIUM IODIDE (ANTIDOTE) 130 MG PO TABS
ORAL_TABLET | ORAL | Status: AC
  Filled 2024-01-31: qty 1

## 2024-01-31 MED ORDER — IOFLUPANE I 123 185 MBQ/2.5ML IV SOLN
4.5000 | Freq: Once | INTRAVENOUS | Status: AC | PRN
Start: 2024-01-31 — End: 2024-01-31
  Administered 2024-01-31: 4.5 via INTRAVENOUS
  Filled 2024-01-31: qty 5

## 2024-02-01 MED ORDER — RASAGILINE MESYLATE 1 MG PO TABS: 1.0000 mg | ORAL_TABLET | Freq: Every day | ORAL | 5 refills | Status: DC

## 2024-02-01 NOTE — Addendum Note (Signed)
 Addended by: [name] on: 02/01/2024 02:23 PM   Modules accepted: Orders

## 2024-02-05 ENCOUNTER — Telehealth: Payer: Self-pay | Admitting: Neurology

## 2024-02-05 NOTE — Telephone Encounter (Signed)
 ok

## 2024-02-05 NOTE — Telephone Encounter (Signed)
 Patient rescheduled appointment due to would like wife to be able to come to appointment.

## 2024-02-06 ENCOUNTER — Telehealth: Payer: Self-pay

## 2024-02-06 NOTE — Telephone Encounter (Signed)
-----   Message from [place] sent at 02/01/2024  2:23 PM EDT ----- Dr. Eston Mould  showed ;  Bilaterally impaired dopamine uptake and left more than right - still very subtle, but in context with the  clinical  picture of  REM BD for a decade and  later  following parkinsonism and tremor , this is diagnostic  of Parkinson's disease.   Step 1 ) exercise a lot- walking, gym activity, balance exercises, swimming,  Keep moving is the important advise .   There is the "[employer] gym " near [address]  under Mechele Collin' guidance, he offers a lot of PD- patient's good personal supervision to keep limber and reduce risk of falling.  2) its not yet time for Carbi-dopa Levo-dopa, but will start with AZILECT 1 mg po once a day.  There is possibly a neuro protective benefit.   Early stages of PD do not have to be dopamine supplemented /medicated (1) .   PS : We can offer a second opinion from Dr Rubin Payor / Pollyann Savoy- Dr Hyacinth Meeker may like the advise of a very experienced movement disorder physician.  If an appointment at [facility] is made, please print all our test results and  key images out to take to the appointment in person- apparently access through Epic is spotty. TY

## 2024-02-06 NOTE — Telephone Encounter (Signed)
I called patient to discuss. No answer, left a message asking him to call us back.  

## 2024-02-06 NOTE — Telephone Encounter (Signed)
 Patient returned my call.  I discussed his DaTscan results and recommendations.  Patient reports that he already exercises frequently.  He enjoys walking.  He will try the Azilect 1 mg once daily.  He will let us know if he has any side effects.  He declined a second opinion from [facility] at this time.  He will let us know if he changes his mind.  He will keep his July appointment as scheduled with Dr. Vickey Huger.  Patient verbalized understanding of results and recommendations.  He will call us with interim questions or concerns prior to his appointment in July.

## 2024-02-12 ENCOUNTER — Telehealth: Payer: Self-pay | Admitting: Neurology

## 2024-02-12 NOTE — Telephone Encounter (Signed)
 We can attempt a PA for the patient and see if we can get better coverage.

## 2024-02-12 NOTE — Telephone Encounter (Signed)
 Medication Dr. Vickey Huger just prescribed rasagiline (AZILECT) 1 MG TABS tablet cost $280 month. Want to know if there is something just as effective, but cost a lot less. See some different, but do not if worth $280 a month. Would  like a call from the nurse.

## 2024-02-12 NOTE — Telephone Encounter (Signed)
 Pt said do not see the DAT Scan results from Dr. Vickey Huger in [place].

## 2024-02-13 ENCOUNTER — Other Ambulatory Visit: Payer: Self-pay | Admitting: Neurology

## 2024-02-13 MED ORDER — RASAGILINE MESYLATE 1 MG PO TABS: 1.0000 mg | ORAL_TABLET | Freq: Every day | ORAL | 1 refills | Status: DC

## 2024-02-13 MED ORDER — RASAGILINE MESYLATE 1 MG PO TABS: 1.0000 mg | ORAL_TABLET | Freq: Every day | ORAL | 5 refills | Status: DC

## 2024-02-13 NOTE — Addendum Note (Signed)
 Addended by: Judi Cong on: 02/13/2024 10:46 AM   Modules accepted: Orders

## 2024-02-13 NOTE — Telephone Encounter (Signed)
 PA attempted on CMM/BCBS Medicare KEY:[id number] States a PA is not needed.  Checked goodrx and it will still be $80 with that. I will reach out to the work in provider to see if they would recommend an alternative medication for the patient.

## 2024-02-13 NOTE — Telephone Encounter (Signed)
 Called the pt back and was able to discuss with him cost of the medication. I advised I did discuss with the work in provider and he would prefer for Dr Vickey Huger to discuss changing medication. In the meantime, I did advise I attempted a PA which was not needed through insurance. I also went to goodrx and advised he could get it through [facility] for close to 79$ for 3 month supply. Pt was agreeable to that. I will [facility] the [facility] [address] as requested by the patient. Included the goodrx information when I sent the script for the patient.

## 2024-02-28 DIAGNOSIS — M9903 Segmental and somatic dysfunction of lumbar region: Secondary | ICD-10-CM | POA: Diagnosis not present

## 2024-02-28 DIAGNOSIS — M9902 Segmental and somatic dysfunction of thoracic region: Secondary | ICD-10-CM | POA: Diagnosis not present

## 2024-02-28 DIAGNOSIS — M5414 Radiculopathy, thoracic region: Secondary | ICD-10-CM | POA: Diagnosis not present

## 2024-02-28 DIAGNOSIS — M5417 Radiculopathy, lumbosacral region: Secondary | ICD-10-CM | POA: Diagnosis not present

## 2024-03-02 ENCOUNTER — Encounter

## 2024-03-04 ENCOUNTER — Ambulatory Visit (INDEPENDENT_AMBULATORY_CARE_PROVIDER_SITE_OTHER): Admitting: Neurology

## 2024-03-04 DIAGNOSIS — I4811 Longstanding persistent atrial fibrillation: Secondary | ICD-10-CM

## 2024-03-04 DIAGNOSIS — G4752 REM sleep behavior disorder: Secondary | ICD-10-CM | POA: Diagnosis not present

## 2024-03-04 DIAGNOSIS — R001 Bradycardia, unspecified: Secondary | ICD-10-CM

## 2024-03-04 DIAGNOSIS — G4719 Other hypersomnia: Secondary | ICD-10-CM

## 2024-03-04 DIAGNOSIS — R0683 Snoring: Secondary | ICD-10-CM
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
## 2024-03-13 ENCOUNTER — Encounter: Payer: Self-pay | Admitting: Neurology

## 2024-03-14 ENCOUNTER — Other Ambulatory Visit: Payer: Self-pay | Admitting: Neurology

## 2024-03-14 DIAGNOSIS — G4752 REM sleep behavior disorder: Secondary | ICD-10-CM

## 2024-03-14 MED ORDER — CLONAZEPAM 0.5 MG PO TABS: 1.5000 mg | ORAL_TABLET | Freq: Every day | ORAL | 0 refills | Status: DC

## 2024-03-14 NOTE — Progress Notes (Signed)
 7 days supply sent to patient as he is on vacation and forgot meds home.

## 2024-05-06 DIAGNOSIS — K080 Exfoliation of teeth due to systemic causes: Secondary | ICD-10-CM | POA: Diagnosis not present

## 2024-05-08 ENCOUNTER — Other Ambulatory Visit: Payer: Self-pay | Admitting: Neurology

## 2024-05-08 DIAGNOSIS — M5414 Radiculopathy, thoracic region: Secondary | ICD-10-CM | POA: Diagnosis not present

## 2024-05-08 DIAGNOSIS — G4752 REM sleep behavior disorder: Secondary | ICD-10-CM

## 2024-05-08 DIAGNOSIS — M9902 Segmental and somatic dysfunction of thoracic region: Secondary | ICD-10-CM | POA: Diagnosis not present

## 2024-05-08 DIAGNOSIS — M5417 Radiculopathy, lumbosacral region: Secondary | ICD-10-CM | POA: Diagnosis not present

## 2024-05-08 DIAGNOSIS — M9903 Segmental and somatic dysfunction of lumbar region: Secondary | ICD-10-CM | POA: Diagnosis not present

## 2024-05-12 ENCOUNTER — Other Ambulatory Visit: Payer: Self-pay

## 2024-05-12 DIAGNOSIS — E785 Hyperlipidemia, unspecified: Secondary | ICD-10-CM

## 2024-05-12 DIAGNOSIS — I480 Paroxysmal atrial fibrillation: Secondary | ICD-10-CM

## 2024-05-12 MED ORDER — FLECAINIDE ACETATE 100 MG PO TABS: 100.0000 mg | ORAL_TABLET | Freq: Two times a day (BID) | ORAL | 1 refills | Status: DC

## 2024-05-12 MED ORDER — ROSUVASTATIN CALCIUM 10 MG PO TABS: 10.0000 mg | ORAL_TABLET | Freq: Every day | ORAL | 1 refills | Status: AC

## 2024-05-12 MED ORDER — DILTIAZEM HCL ER COATED BEADS 120 MG PO CP24: 120.0000 mg | ORAL_CAPSULE | Freq: Every day | ORAL | 1 refills | Status: AC

## 2024-05-20 ENCOUNTER — Other Ambulatory Visit: Payer: Self-pay

## 2024-05-20 DIAGNOSIS — I480 Paroxysmal atrial fibrillation: Secondary | ICD-10-CM

## 2024-05-20 MED ORDER — APIXABAN 5 MG PO TABS: 5.0000 mg | ORAL_TABLET | Freq: Two times a day (BID) | ORAL | 3 refills | Status: AC

## 2024-05-20 NOTE — Telephone Encounter (Signed)
 Prescription refill request for Eliquis  received. Indication:afib Last office visit:1/25 Scr:1.21  11/24 Age: 73 Weight:98.4  kg  Prescription refilled

## 2024-05-21 ENCOUNTER — Ambulatory Visit: Admitting: Neurology

## 2024-05-21 NOTE — Telephone Encounter (Signed)
 Requested Prescriptions   Pending Prescriptions Disp Refills   clonazePAM  (KLONOPIN ) 0.5 MG tablet [Pharmacy Med Name: CLONAZEPAM  0.5MG  TABLETS] 135 tablet     Sig: TAKE ONE AND ONE-HALF TABLETS BY MOUTH AT BEDTIME   Last seen 01/08/24 Next appt 05/27/24 ispenses   Dispensed Days Supply Quantity Provider Pharmacy  CLONAZEPAM  0.5 MG TABLET 03/14/2024 7 11 each Camara, Amadou, MD [facility] [phone] - S...  CLONAZEPAM  0.5MG  TABLETS 02/21/2024 90 135 each Dohmeier, Dedra, MD [facility] #...  CLONAZEPAM  0.5MG  TABLETS 11/27/2023 90 135 each Dohmeier, Dedra, MD [facility] DRUG STORE #...  CLONAZEPAM  0.5MG  TABLETS 08/23/2023 90 135 each Dohmeier, Dedra, MD [facility]...  CLONAZEPAM  0.5MG  TABLETS 05/25/2023 90 135 each Dohmeier, Dedra, MD [facility].SABRASABRA

## 2024-05-27 ENCOUNTER — Encounter: Payer: Self-pay | Admitting: Neurology

## 2024-05-27 ENCOUNTER — Ambulatory Visit: Admitting: Neurology

## 2024-05-27 DIAGNOSIS — G4752 REM sleep behavior disorder: Secondary | ICD-10-CM | POA: Diagnosis not present

## 2024-05-27 MED ORDER — CLONAZEPAM 0.5 MG PO TABS: 0.7500 mg | ORAL_TABLET | Freq: Every day | ORAL | 1 refills | Status: AC

## 2024-05-27 MED ORDER — DONEPEZIL HCL 5 MG PO TABS: 5.0000 mg | ORAL_TABLET | Freq: Every day | ORAL | 2 refills | Status: DC

## 2024-05-27 NOTE — Patient Instructions (Signed)
 Donepezil Tablets What is this medication? DONEPEZIL (doe NEP e zil) treats memory loss and confusion (dementia) in people who have Alzheimer disease. It works by improving attention, memory, and the ability to engage in daily activities. It is not a cure for dementia or Alzheimer disease. This medicine may be used for other purposes; ask your health care provider or pharmacist if you have questions. COMMON BRAND NAME(S): Aricept What should I tell my care team before I take this medication? They need to know if you have any of these conditions: Head injury Heart disease Irregular heartbeat or rhythm Liver disease Lung or breathing disease, such as asthma Seizures Stomach ulcers, other stomach or intestine problems Stomach bleeding Trouble passing urine An unusual or allergic reaction to donepezil, other medications, foods, dyes, or preservatives Pregnant or trying to get pregnant Breastfeeding How should I use this medication? Take this medication by mouth with a glass of water. Follow the directions on the prescription label. You may take this medication with or without food. Take this medication at regular intervals. This medication is usually taken before bedtime. Do not take it more often than directed. Continue to take your medication even if you feel better. Do not stop taking except on your care team's advice. If you are taking the 23 mg donepezil tablet, swallow it whole; do not cut, crush, or chew it. Talk to your care team about the use of this medication in children. Special care may be needed. Overdosage: If you think you have taken too much of this medicine contact a poison control center or emergency room at once. NOTE: This medicine is only for you. Do not share this medicine with others. What if I miss a dose? If you miss a dose, take it as soon as you can. If it is almost time for your next dose, take only that dose, do not take double or extra doses. What may interact  with this medication? Do not take this medication with any of the following: Certain medications for fungal infections, such as itraconazole, fluconazole, posaconazole, voriconazole Cisapride Dextromethorphan; quinidine Dronedarone Pimozide Quinidine Thioridazine This medication may also interact with the following: Antihistamines for allergy, cough, and cold Atropine Bethanechol Carbamazepine Certain medications for bladder problems, such as oxybutynin or tolterodine Certain medications for Parkinson disease, such as benztropine or trihexyphenidyl Certain medications for stomach problems, such as dicyclomine or hyoscyamine Certain medications for travel sickness, such as scopolamine Dexamethasone Dofetilide Ipratropium NSAIDs, medications for pain and inflammation, such as ibuprofen or naproxen Other medications for Alzheimer disease Other medications that cause heart rhythm changes Phenobarbital Phenytoin Rifampin, rifabutin, or rifapentine Ziprasidone This list may not describe all possible interactions. Give your health care provider a list of all the medicines, herbs, non-prescription drugs, or dietary supplements you use. Also tell them if you smoke, drink alcohol, or use illegal drugs. Some items may interact with your medicine. What should I watch for while using this medication? Visit your care team for regular checks on your progress. Tell your care team if your symptoms do not start to get better or if they get worse. This medication may affect your coordination, reaction time, or judgment. Do not drive or operate machinery until you know how this medication affects you. Sit up or stand slowly to reduce the risk of dizzy or fainting spells. Drinking alcohol with this medication can increase the risk of these side effects. What side effects may I notice from receiving this medication? Side effects that you should report  to your care team as soon as possible: Allergic  reactions--skin rash, itching, hives, swelling of the face, lips, tongue, or throat Peptic ulcer--burning stomach pain, loss of appetite, bloating, burping, heartburn, nausea, vomiting Seizures Slow heartbeat--dizziness, feeling faint or lightheaded, confusion, trouble breathing, unusual weakness or fatigue Stomach bleeding--bloody or black, tar-like stools, vomiting blood or brown material that looks like coffee grounds Trouble passing urine Side effects that usually do not require medical attention (report these to your care team if they continue or are bothersome): Diarrhea Fatigue Loss of appetite Muscle pain or cramps Nausea Trouble sleeping This list may not describe all possible side effects. Call your doctor for medical advice about side effects. You may report side effects to FDA at 1-800-FDA-1088. Where should I keep my medication? Keep out of reach of children. Store at room temperature between 15 and 30 degrees C (59 and 86 degrees F). Throw away any unused medication after the expiration date. NOTE: This sheet is a summary. It may not cover all possible information. If you have questions about this medicine, talk to your doctor, pharmacist, or health care provider.  2024 Elsevier/Gold Standard (2023-05-03 00:00:00)

## 2024-05-27 NOTE — Progress Notes (Signed)
 Provider:  Dedra Gores, MD  Primary Care Physician:  Kip Righter, MD [address] [place] [address]     Referring Provider: Kip Righter, Md [address] [place],  [place] [address]          Chief Complaint according to patient   Patient presents with:                HISTORY OF PRESENT ILLNESS:  Randy Cantu is a left handed  73 y.o. male patient who is here for revisit with wife Dr  Ginnie Pinal , OD, on  05/27/2024 for Parkinson's disease versus Parkinsonism . He has been followed for REM BD for a decade, but developed rigor and stooped gait only 2 years ago, now [facility]. DAT scan was midly abnormal, left side mildly more than right affected.  He had a severe side effect to medications. Azilect . Was confused on it couldn't drive.  Has not been tried on sinemet, but since his DAT scan showed still dopamine, I felt less inclined to start.  He was advised to keep to a MIND/ mediterranean diet and keep active, ' PT can be recommended. I recommend [facility] [employer],   [facility] 12/ 24, Sleep study was negative for OSA and had chin activity in REM.      05/27/2024    2:52 PM  Montreal Cognitive Assessment   Visuospatial/ Executive (0/5) 4  Naming (0/3) 3  Attention: Read list of digits (0/2) 1  Attention: Read list of letters (0/1) 1  Attention: Serial 7 subtraction starting at 100 (0/3) 3  Language: Repeat phrase (0/2) 1  Language : Fluency (0/1) 0  Abstraction (0/2) 2  Delayed Recall (0/5) 3  Orientation (0/6) 6  Total 24     Chief concern according to patient :  he wanted to have his wife here to hear how the dx was established and what treatments are available,   We discussed Aricept ,  best diet and exercise.   I encourage to  take the [employer] up on invitation.  .     Review of Systems: Out of a complete 14 system review, the patient complains of only the following symptoms, and all  other reviewed systems are negative.:   SLEEPINESS ?  How likely are you to doze in the following situations: 0 = not likely, 1 = slight chance, 2 = moderate chance, 3 = high chance  Sitting and Reading? Watching Television? Sitting inactive in a public place (theater or meeting)? Lying down in the afternoon when circumstances permit? Sitting and talking to someone? Sitting quietly after lunch without alcohol? In a car, while stopped for a few minutes in traffic? As a passenger in a car for an hour without a break?  Total = 12       Social History   Socioeconomic History   Marital status: Married    Spouse name: Not on file   Number of children: 2   Years of education: Not on file   Highest education level: Not on file  Occupational History   Occupation: DOCTOR/EYE    Employer: SELF EMPLOYED  Tobacco Use   Smoking status: Never   Smokeless tobacco: Never  Vaping Use   Vaping status: Never Used  Substance and Sexual Activity   Alcohol use: Yes    Alcohol/week: 14.0 standard drinks of alcohol    Types: 14 Shots  of liquor per week   Drug use: No   Sexual activity: Not on file  Other Topics Concern   Not on file  Social History Narrative   Lives in [place] and works as an Sport and exercise psychologist   Caffeine 3-5 cups daily average.   Social Drivers of Corporate investment banker Strain: Not on file  Food Insecurity: Not on file  Transportation Needs: Not on file  Physical Activity: Not on file  Stress: Not on file  Social Connections: Not on file    Family History  Problem Relation Age of Onset   Peripheral vascular disease Mother        afib in her 25s   Aplastic anemia Father    Prostate cancer Maternal Grandfather    Leukemia Paternal Grandfather    Cancer Other    Colon cancer Neg Hx    Sleep apnea Neg Hx    Parkinson's disease Neg Hx    Dementia Neg Hx     Past Medical History:  Diagnosis Date   Allergic rhinitis    HLD (hyperlipidemia)    Lactose  intolerance    PAF (paroxysmal atrial fibrillation) (HCC)    s/p afib ablation 11/17/10   Periodic limb movement disorder (PLMD)    Right foot pain    all of foot 1--/2016    Past Surgical History:  Procedure Laterality Date   atrial fibrillation ablation  11/17/10   PVI by Dr Kelsie   TONSILLECTOMY       Current Outpatient Medications on File Prior to Visit  Medication Sig Dispense Refill   apixaban  (ELIQUIS ) 5 MG TABS tablet Take 1 tablet (5 mg total) by mouth 2 (two) times daily. 180 tablet 3   clonazePAM  (KLONOPIN ) 0.5 MG tablet TAKE ONE AND ONE-HALF TABLETS BY MOUTH AT BEDTIME 135 tablet 1   diltiazem  (CARDIZEM  CD) 120 MG 24 hr capsule Take 1 capsule (120 mg total) by mouth daily. 90 capsule 1   fexofenadine (ALLEGRA) 180 MG tablet Take 180 mg by mouth daily as needed for allergies or rhinitis.     flecainide  (TAMBOCOR ) 100 MG tablet Take 1 tablet (100 mg total) by mouth 2 (two) times daily. 180 tablet 1   MAGNESIUM PO Take 750 mg by mouth as needed (constipation).     rasagiline  (AZILECT ) 1 MG TABS tablet Take 1 tablet (1 mg total) by mouth daily. 90 tablet 1   rasagiline  (AZILECT ) 1 MG TABS tablet Take 1 tablet (1 mg total) by mouth daily. 30 tablet 5   rosuvastatin  (CRESTOR ) 10 MG tablet Take 1 tablet (10 mg total) by mouth daily. 90 tablet 1   No current facility-administered medications on file prior to visit.    Allergies  Allergen Reactions   Pantoprazole Sodium     REACTION: rash from this or from  topical disinfectant     DIAGNOSTIC DATA (LABS, IMAGING, TESTING) - I reviewed patient records, labs, notes, testing and imaging myself where available.  Lab Results  Component Value Date   WBC 6.4 10/04/2022   HGB 15.7 10/04/2022   HCT 45.9 10/04/2022   MCV 95 10/04/2022   PLT 152 10/04/2022      Component Value Date/Time   NA 139 10/04/2022 1241   K 4.8 10/04/2022 1241   CL 103 10/04/2022 1241   CO2 25 10/04/2022 1241   GLUCOSE 100 (H) 10/04/2022 1241    GLUCOSE 91 08/23/2012 0941   BUN 19 10/04/2022 1241   CREATININE 1.21 10/04/2022 1241  CALCIUM  10.0 10/04/2022 1241   PROT 6.4 09/12/2021 1019   ALBUMIN 4.6 09/12/2021 1019   AST 22 09/12/2021 1019   ALT 14 09/12/2021 1019   ALKPHOS 69 09/12/2021 1019   BILITOT 1.1 09/12/2021 1019   GFRNONAA 67 04/18/2018 1418   GFRAA 78 04/18/2018 1418   Lab Results  Component Value Date   CHOL 151 09/12/2021   HDL 55 09/12/2021   LDLCALC 77 09/12/2021   LDLDIRECT 70 04/18/2018   TRIG 105 09/12/2021   CHOLHDL 2.7 09/12/2021   No results found for: HGBA1C Lab Results  Component Value Date   VITAMINB12 403 06/14/2007   Lab Results  Component Value Date   TSH 2.24 08/23/2012    PHYSICAL EXAM:  Vitals:   05/27/24 1450  BP: 117/78   No data found. Body mass index is 28.63 kg/m.   Wt Readings from Last 3 Encounters:  05/27/24 217 lb (98.4 kg)  01/08/24 217 lb (98.4 kg)  12/03/23 218 lb 6.4 oz (99.1 kg)     Ht Readings from Last 3 Encounters:  05/27/24 6' 1 (1.854 m)  01/08/24 6' (1.829 m)  12/03/23 6' 1 (1.854 m)      General: The patient is awake, alert and appears not in acute distress and groomed. Head: Normocephalic, atraumatic.  Neck is supple. Mallampati 2,  neck circumference: 17.5 inches . Nasal airflow barely  patent.   Overbite Dwan is not seen.  Dental status: biological  Cardiovascular:  Regular rate and cardiac rhythm by pulse,  without distended neck veins. Respiratory: Lungs are clear to auscultation.  Skin:  Without evidence of ankle edema, or rash. Trunk: The patient's posture is erect.   NEUROLOGIC EXAM: The patient is awake and alert, oriented to place and time.   Memory subjective described as impaired   Attention span & concentration ability appears normal.  Speech is fluent,  without  dysarthria,  mild dysphonia .SABRA  Mood and affect are appropriate.   Cranial nerves:  loss of smell - Pupils are equal and briskly reactive to light.  Funduscopic exam deferred.  Extraocular movements in vertical and horizontal planes were intact and without nystagmus. No Diplopia. Visual fields by finger perimetry are intact. Hearing was intact to soft voice and finger rubbing.    Facial sensation intact to fine touch.  Facial motor strength is symmetric and tongue and uvula move midline.  Neck ROM : rotation, tilt and flexion extension were normal for age and shoulder shrug was symmetrical.    Motor exam:  Symmetric bulk, left biceps cogwheeling, no tremor at rest but with action.  symmetric grip strength . Reports heaviness in both legs.    Sensory:  Fine touch and vibration were intact  Proprioception tested in the upper extremities was normal.   Coordination: Rapid alternating movements in the fingers/hands were of normal speed.  The Finger-to-nose maneuver was intact without evidence of ataxia, dysmetria or tremor.   Gait and station: Patient could rise unassisted from a seated position, walked without assistive device.  Slightly reduced width, shuffling, becoming more stooped.  Stance is of normal width/ base and the patient turned with 3.5  steps.  Toe and heel walk were deferred.      Deep tendon reflexes: in the  upper and lower extremities are symmetric and intact.     ASSESSMENT AND PLAN :   73 y.o. year old male  here with:    1) [facility]- started Aricept  5 mg , MIND diet.  2) balance and speed of movement without tremor-  exercise regimen and PT advise.   3) preparation of the home for adjustments.    Rv with me in 6 months .MOCA    I would like to thank Kip Righter, MD and Kip Righter, Md [address] [place],  [place] [address] for allowing me to meet with this pleasant patient.   Sleep Clinic Patients are generally offered input on sleep hygiene, life style changes and how to improve compliance with medical treatment where applicable. Review and reiteration of good sleep hygiene  measures is offered to any sleep clinic patient, be it in the first consultation or with any follow up visits.  Any CPAP patient should be reminded to be fully compliant with PAP therapy , (defined as using PAP therapy for more than 4 hours each night ) with the goal to improve sleep related symptoms and decrease long term cardiovascular risks. Any PAP therapy patient should be reminded, that it may take up to 3 months to get fully used to using PAP and it may take 1-2 weeks for an established CPAP user to acclimatize to changes in pressure or mask. The earlier full compliance is achieved, the better long term compliance tends to be.   Please note that untreated obstructive sleep apnea may carry additional perioperative morbidity. Patients with significant obstructive sleep apnea should receive perioperative PAP therapy and the surgical team should be informed of the diagnosis and degree of sleep disordered breathing.  Sleep fragmentation in the presence of normal proportional sleep stages is a nonspecific findings and per se does not signify an intrinsic sleep disorder or a cause for the patient's sleep-related symptoms.  Causes include (but are not limited to) the unfamiliarity of sleeping while recorded by HST device or sleeping in a sleep lab for a full Polysomnography sleep study, but also circadian rhythm disturbances, medication side effects or an underlying mood disorder or medical problem.    Any patient with sleepiness should be cautioned not to drive, work at heights, or operate dangerous or heavy equipment when feeling tired or sleepy.      The patient will be seen in follow-up in the sleep clinic at [facility] for discussion of test results, sleep related symptoms and treatment compliance review, further management strategies, etc.   The referring provider will be notified of the test results.   The patient's condition requires frequent monitoring and adjustments in the treatment plan,  reflecting the ongoing complexity of care.  This provider is the continuing focal point for all needed services for this condition.  After spending a total time of  45  minutes face to face and time for  history taking, physical and neurologic examination, review of laboratory studies,  personal review of imaging studies, reports and results of other testing and review of referral information / records as far as provided in visit,   Electronically signed by: Dedra Gores, MD 05/27/2024 3:03 PM  [facility] Board certified by The [employer] of Sleep Medicine and Diplomate of the [employer] of Sleep Medicine. Board certified In Neurology through the ABPN, Fellow of the [employer] of Neu[employer].

## 2024-05-29 DIAGNOSIS — K080 Exfoliation of teeth due to systemic causes: Secondary | ICD-10-CM | POA: Diagnosis not present

## 2024-06-25 DIAGNOSIS — N393 Stress incontinence (female) (male): Secondary | ICD-10-CM | POA: Diagnosis not present

## 2024-06-25 DIAGNOSIS — Z125 Encounter for screening for malignant neoplasm of prostate: Secondary | ICD-10-CM | POA: Diagnosis not present

## 2024-06-25 DIAGNOSIS — N5201 Erectile dysfunction due to arterial insufficiency: Secondary | ICD-10-CM | POA: Diagnosis not present

## 2024-06-25 DIAGNOSIS — R3912 Poor urinary stream: Secondary | ICD-10-CM | POA: Diagnosis not present

## 2024-07-16 DIAGNOSIS — K080 Exfoliation of teeth due to systemic causes: Secondary | ICD-10-CM | POA: Diagnosis not present

## 2024-07-17 DIAGNOSIS — H25043 Posterior subcapsular polar age-related cataract, bilateral: Secondary | ICD-10-CM | POA: Diagnosis not present

## 2024-07-17 DIAGNOSIS — H40013 Open angle with borderline findings, low risk, bilateral: Secondary | ICD-10-CM | POA: Diagnosis not present

## 2024-07-17 DIAGNOSIS — H18413 Arcus senilis, bilateral: Secondary | ICD-10-CM | POA: Diagnosis not present

## 2024-07-17 DIAGNOSIS — H2512 Age-related nuclear cataract, left eye: Secondary | ICD-10-CM | POA: Diagnosis not present

## 2024-07-17 DIAGNOSIS — H2513 Age-related nuclear cataract, bilateral: Secondary | ICD-10-CM | POA: Diagnosis not present

## 2024-08-04 DIAGNOSIS — H52209 Unspecified astigmatism, unspecified eye: Secondary | ICD-10-CM | POA: Diagnosis not present

## 2024-08-04 DIAGNOSIS — H524 Presbyopia: Secondary | ICD-10-CM | POA: Diagnosis not present

## 2024-08-04 DIAGNOSIS — H2513 Age-related nuclear cataract, bilateral: Secondary | ICD-10-CM | POA: Diagnosis not present

## 2024-08-04 DIAGNOSIS — H2512 Age-related nuclear cataract, left eye: Secondary | ICD-10-CM | POA: Diagnosis not present

## 2024-08-04 DIAGNOSIS — H40013 Open angle with borderline findings, low risk, bilateral: Secondary | ICD-10-CM | POA: Diagnosis not present

## 2024-08-04 DIAGNOSIS — H40053 Ocular hypertension, bilateral: Secondary | ICD-10-CM | POA: Diagnosis not present

## 2024-08-05 ENCOUNTER — Telehealth: Payer: Self-pay | Admitting: Neurology

## 2024-08-05 DIAGNOSIS — H25011 Cortical age-related cataract, right eye: Secondary | ICD-10-CM | POA: Diagnosis not present

## 2024-08-05 DIAGNOSIS — H2511 Age-related nuclear cataract, right eye: Secondary | ICD-10-CM | POA: Diagnosis not present

## 2024-08-05 DIAGNOSIS — H25041 Posterior subcapsular polar age-related cataract, right eye: Secondary | ICD-10-CM | POA: Diagnosis not present

## 2024-08-05 NOTE — Telephone Encounter (Signed)
 Pt called stating that the donepezil  (ARICEPT ) 5 MG tablet he was put on is working for him and he would like an Rx for a 90 day qt sent to [facility].

## 2024-08-07 MED ORDER — DONEPEZIL HCL 10 MG PO TABS: 10.0000 mg | ORAL_TABLET | Freq: Every day | ORAL | 3 refills | Status: DC

## 2024-08-07 NOTE — Telephone Encounter (Signed)
 Spoke with patient.  He reports better name recall and no side effects whatsoever on Donepezil  5 mg.  Patient states his HR is currently 53 and it usually ranges from 48-55 with blood pressure usually 115/75.   Patient would like to try Donepezil  10 mg dose as 30 day supply for now at local pharmacy. He will watch for side effects such as feeling faint/lightheaded, SOB, chest pain. He will notify us  if any side effects and if severe he will proceed immediately to ED.   He states he signed up for the [facility] research trial.  He was told he needed to wait until after his cataract surgeries.  He will follow-up after his second surgery is done.

## 2024-08-07 NOTE — Telephone Encounter (Signed)
 Spoke with Dr Chalice and received v.o. to change Donepezil  Rx to 10 mg po at bedtime. Canceled 5 mg dose.

## 2024-08-08 DIAGNOSIS — H40053 Ocular hypertension, bilateral: Secondary | ICD-10-CM | POA: Diagnosis not present

## 2024-08-08 DIAGNOSIS — H40013 Open angle with borderline findings, low risk, bilateral: Secondary | ICD-10-CM | POA: Diagnosis not present

## 2024-08-18 DIAGNOSIS — H2511 Age-related nuclear cataract, right eye: Secondary | ICD-10-CM | POA: Diagnosis not present

## 2024-08-18 DIAGNOSIS — H25041 Posterior subcapsular polar age-related cataract, right eye: Secondary | ICD-10-CM | POA: Diagnosis not present

## 2024-08-18 DIAGNOSIS — H25011 Cortical age-related cataract, right eye: Secondary | ICD-10-CM | POA: Diagnosis not present

## 2024-08-18 DIAGNOSIS — H52209 Unspecified astigmatism, unspecified eye: Secondary | ICD-10-CM | POA: Diagnosis not present

## 2024-08-20 DIAGNOSIS — E785 Hyperlipidemia, unspecified: Secondary | ICD-10-CM | POA: Diagnosis not present

## 2024-08-20 DIAGNOSIS — I4891 Unspecified atrial fibrillation: Secondary | ICD-10-CM | POA: Diagnosis not present

## 2024-08-20 DIAGNOSIS — Z0000 Encounter for general adult medical examination without abnormal findings: Secondary | ICD-10-CM | POA: Diagnosis not present

## 2024-08-20 NOTE — Telephone Encounter (Signed)
 FYI-Pt called wanting to inform provider that when he made the switch to the 10mg  of the Donepezil  his wife informed him that he was actually getting worse so pt has switched back to the 5mg .

## 2024-08-20 NOTE — Addendum Note (Signed)
 Addended by: NEYSA POOL S on: 08/20/2024 10:00 AM   Modules accepted: Orders

## 2024-08-27 DIAGNOSIS — Z0000 Encounter for general adult medical examination without abnormal findings: Secondary | ICD-10-CM | POA: Diagnosis not present

## 2024-08-27 DIAGNOSIS — D6869 Other thrombophilia: Secondary | ICD-10-CM | POA: Diagnosis not present

## 2024-08-27 DIAGNOSIS — I4891 Unspecified atrial fibrillation: Secondary | ICD-10-CM | POA: Diagnosis not present

## 2024-08-27 DIAGNOSIS — E785 Hyperlipidemia, unspecified: Secondary | ICD-10-CM | POA: Diagnosis not present

## 2024-09-03 ENCOUNTER — Other Ambulatory Visit: Payer: Self-pay | Admitting: Neurology

## 2024-09-03 MED ORDER — DONEPEZIL HCL 5 MG PO TABS
5.0000 mg | ORAL_TABLET | Freq: Every day | ORAL | 1 refills | Status: AC
Start: 2024-09-03 — End: ?

## 2024-09-03 NOTE — Telephone Encounter (Signed)
 [facility]  called to request medication refill   for Pt medication donepezil  (ARICEPT ) 5 MG tablet   Medication is to be sent to  [facility] - [place], [place] - [address] Phone: [phone]  Fax: [phone]

## 2024-09-08 DIAGNOSIS — N3946 Mixed incontinence: Secondary | ICD-10-CM | POA: Diagnosis not present

## 2024-10-08 ENCOUNTER — Telehealth: Payer: Self-pay | Admitting: Neurology

## 2024-10-08 NOTE — Telephone Encounter (Signed)
 Pt has r/s appointment due to being out of town, he is on wait list

## 2024-10-10 ENCOUNTER — Ambulatory Visit: Admitting: Podiatry

## 2024-10-15 ENCOUNTER — Encounter: Payer: Self-pay | Admitting: Podiatry

## 2024-10-15 ENCOUNTER — Ambulatory Visit: Admitting: Podiatry

## 2024-10-15 DIAGNOSIS — M7751 Other enthesopathy of right foot: Secondary | ICD-10-CM | POA: Diagnosis not present

## 2024-10-15 DIAGNOSIS — M7752 Other enthesopathy of left foot: Secondary | ICD-10-CM

## 2024-10-15 NOTE — Progress Notes (Signed)
 Subjective:   Patient ID: Randy Cantu, male   DOB: 73 y.o.   MRN: [id number]   HPI Patient states he still has pain in his forefoot states it is improved and orthotics have been helpful but they are starting to fade and starting to lose their support and not as effective.  Patient does not smoke likes to be active   Review of Systems  All other systems reviewed and are negative.       Objective:  Physical Exam Vitals and nursing note reviewed.  Constitutional:      Appearance: He is well-developed.  Pulmonary:     Effort: Pulmonary effort is normal.  Musculoskeletal:        General: Normal range of motion.  Skin:    General: Skin is warm.  Neurological:     Mental Status: He is alert.     Neurovascular status intact muscle strength found to be adequate range of motion adequate with patient noted to have mild equinus and was noted to have discomfort around the lesser MPJs bilateral mild in nature under control with utilization of orthotic treatment     Assessment:  Inflammatory capsulitis of the lesser MPJs that is relatively low-grade currently with orthotics providing good control     Plan:  H&P reviewed I have recommended the continuation of orthotic treatment and at this point I casted for functional orthotic devices to offload weight from the lesser MPJs.  Patient will receive back when they are returned.  Pedorthist did the casting and will dispense

## 2024-10-16 DIAGNOSIS — H02834 Dermatochalasis of left upper eyelid: Secondary | ICD-10-CM | POA: Diagnosis not present

## 2024-10-16 DIAGNOSIS — H02413 Mechanical ptosis of bilateral eyelids: Secondary | ICD-10-CM | POA: Diagnosis not present

## 2024-10-16 DIAGNOSIS — H53483 Generalized contraction of visual field, bilateral: Secondary | ICD-10-CM | POA: Diagnosis not present

## 2024-10-16 DIAGNOSIS — H02423 Myogenic ptosis of bilateral eyelids: Secondary | ICD-10-CM | POA: Diagnosis not present

## 2024-10-16 DIAGNOSIS — H02831 Dermatochalasis of right upper eyelid: Secondary | ICD-10-CM | POA: Diagnosis not present

## 2024-10-24 ENCOUNTER — Other Ambulatory Visit: Payer: Self-pay

## 2024-10-24 DIAGNOSIS — I480 Paroxysmal atrial fibrillation: Secondary | ICD-10-CM

## 2024-10-24 DIAGNOSIS — E785 Hyperlipidemia, unspecified: Secondary | ICD-10-CM

## 2024-10-24 MED ORDER — DILTIAZEM HCL ER COATED BEADS 120 MG PO CP24: 120.0000 mg | ORAL_CAPSULE | Freq: Every day | ORAL | 0 refills | Status: AC

## 2024-10-24 MED ORDER — ROSUVASTATIN CALCIUM 10 MG PO TABS: 10.0000 mg | ORAL_TABLET | Freq: Every day | ORAL | 0 refills | Status: AC

## 2024-10-24 MED ORDER — FLECAINIDE ACETATE 100 MG PO TABS: 100.0000 mg | ORAL_TABLET | Freq: Two times a day (BID) | ORAL | 0 refills | Status: AC

## 2024-11-12 ENCOUNTER — Other Ambulatory Visit: Payer: Self-pay

## 2024-11-12 NOTE — Progress Notes (Signed)
 Called pharmacy to cancel 10mg  Aricept .  Per last office note, patient is to take 5mg  at bed.

## 2024-11-14 ENCOUNTER — Ambulatory Visit

## 2024-11-14 DIAGNOSIS — M7752 Other enthesopathy of left foot: Secondary | ICD-10-CM

## 2024-11-14 DIAGNOSIS — M7751 Other enthesopathy of right foot: Secondary | ICD-10-CM

## 2024-11-14 NOTE — Progress Notes (Unsigned)
 Re do-  he needs mods that werent done.  Will call when ready. Didn't charge and giving him the ones from today.

## 2024-11-27 ENCOUNTER — Ambulatory Visit: Admitting: Neurology

## 2024-11-28 ENCOUNTER — Telehealth: Payer: Self-pay

## 2024-11-28 NOTE — Telephone Encounter (Signed)
 Reworked orthotics are in [employer] office. Left a message for Alm to come by the office to pick them up. No appointment needed.

## 2024-12-08 ENCOUNTER — Telehealth (HOSPITAL_BASED_OUTPATIENT_CLINIC_OR_DEPARTMENT_OTHER): Payer: Self-pay | Admitting: *Deleted

## 2024-12-08 NOTE — Telephone Encounter (Signed)
"  ° °  Pre-operative Risk Assessment    Patient Name: Randy Cantu  DOB: 07-Apr-1951 MRN: [id number]   Date of last office visit: 12/03/2023 Date of next office visit: 12/16/2024  Request for Surgical Clearance    Procedure:  Bilateral upper eyelid blepharoptosis repair-external  Date of Surgery:  Clearance 12/30/24                                 Surgeon:  Dr. Renzo Zaldivar Surgeon's Group or Practice Name:  [facility] Phone number:  [phone] Fax number:  [phone]   Type of Clearance Requested:   - Medical  - Pharmacy:  Hold Apixaban  (Eliquis ) Not indicated.    Type of Anesthesia:  MAC   Additional requests/questions:    Signed, Edsel Grayce Sanders   12/08/2024, 12:38 PM   "

## 2024-12-08 NOTE — Telephone Encounter (Signed)
 Please advise holding Eliquis  prior to bilateral upper eyelid blepharoptosis repair on 2/24. Patient is scheduled for office visit 2/10 and will need labs.    Thank you!  DW

## 2024-12-08 NOTE — Telephone Encounter (Signed)
" ° °  Name: Randy Cantu  DOB: 30-Jun-1951  MRN: [id number]  Primary Cardiologist: None  Chart reviewed as part of pre-operative protocol coverage. Because of Tywone Bembenek Dashner's past medical history and time since last visit, he will require a follow-up in-office visit in order to better assess preoperative cardiovascular risk.  Patient has an office visit scheduled on 2/10 with Dr. Inocencio. Appointment notes have been updated to reflect need for pre-op evaluation.   Pre-op covering staff:  - Please contact requesting surgeon's office via preferred method (i.e, phone, fax) to inform them of need for appointment prior to surgery.  This message will also be routed to pharmacy pool for input on holding Eliquis  as requested below so that this information is available to the clearing provider at time of patient's appointment.   Barnie Hila, NP  12/08/2024, 12:51 PM    "

## 2024-12-09 NOTE — Telephone Encounter (Signed)
 Patient with diagnosis of atrial fibrillation on Eliquis  for anticoagulation.    Procedure:  Bilateral upper eyelid blepharoptosis repair-external   Date of Surgery:  Clearance 12/30/24      CHA2DS2-VASc Score = 2   This indicates a 2.2% annual risk of stroke. The patient's score is based upon: CHF History: 0 HTN History: 0 Diabetes History: 0 Stroke History: 0 Vascular Disease History: 1 Age Score: 1 Gender Score: 0    CrCl 77 Platelet count 152  Patient has not had an Afib/aflutter ablation in the last 3 months, DCCV within the last 4 weeks or a watchman implanted in the last 45 days   Per office protocol, patient can hold Eliquis  for 2 days prior to procedure.   Patient will not need bridging with Lovenox (enoxaparin) around procedure.  **This guidance is not considered finalized until pre-operative APP has relayed final recommendations.**

## 2024-12-11 ENCOUNTER — Telehealth: Payer: Self-pay | Admitting: Podiatry

## 2024-12-16 ENCOUNTER — Ambulatory Visit: Admitting: Cardiology

## 2025-02-23 ENCOUNTER — Ambulatory Visit: Admitting: Neurology

## 2025-02-24 ENCOUNTER — Ambulatory Visit: Admitting: Neurology
